# Patient Record
Sex: Female | Born: 1965 | Race: White | Hispanic: No | Smoking: Never smoker
Health system: Southern US, Community
[De-identification: ages and names within clinical notes are randomized; demographics above are authoritative.]

## PROBLEM LIST (undated history)

## (undated) DIAGNOSIS — H9190 Unspecified hearing loss, unspecified ear: Secondary | ICD-10-CM

## (undated) DIAGNOSIS — C50412 Malignant neoplasm of upper-outer quadrant of left female breast: Principal | ICD-10-CM

## (undated) DIAGNOSIS — C4490 Unspecified malignant neoplasm of skin, unspecified: Secondary | ICD-10-CM

## (undated) DIAGNOSIS — R232 Flushing: Secondary | ICD-10-CM

## (undated) DIAGNOSIS — M51369 Other intervertebral disc degeneration, lumbar region without mention of lumbar back pain or lower extremity pain: Secondary | ICD-10-CM

## (undated) DIAGNOSIS — C50919 Malignant neoplasm of unspecified site of unspecified female breast: Secondary | ICD-10-CM

## (undated) DIAGNOSIS — M5136 Other intervertebral disc degeneration, lumbar region: Secondary | ICD-10-CM

## (undated) DIAGNOSIS — C439 Malignant melanoma of skin, unspecified: Secondary | ICD-10-CM

## (undated) DIAGNOSIS — I10 Essential (primary) hypertension: Secondary | ICD-10-CM

## (undated) DIAGNOSIS — K219 Gastro-esophageal reflux disease without esophagitis: Secondary | ICD-10-CM

## (undated) DIAGNOSIS — C4491 Basal cell carcinoma of skin, unspecified: Secondary | ICD-10-CM

## (undated) DIAGNOSIS — F419 Anxiety disorder, unspecified: Secondary | ICD-10-CM

## (undated) HISTORY — DX: Anxiety disorder, unspecified: F41.9

## (undated) HISTORY — DX: Flushing: R23.2

## (undated) HISTORY — DX: Unspecified malignant neoplasm of skin, unspecified: C44.90

## (undated) HISTORY — DX: Malignant neoplasm of upper-outer quadrant of left female breast: C50.412

## (undated) HISTORY — DX: Malignant neoplasm of unspecified site of unspecified female breast: C50.919

## (undated) HISTORY — PX: DILATION AND CURETTAGE OF UTERUS: SHX78

## (undated) HISTORY — PX: OTHER SURGICAL HISTORY: SHX169

---

## 1988-12-25 HISTORY — PX: OTHER SURGICAL HISTORY: SHX169

## 2007-12-26 HISTORY — PX: BLADDER SUSPENSION: SHX72

## 2014-12-25 DIAGNOSIS — Z9221 Personal history of antineoplastic chemotherapy: Secondary | ICD-10-CM

## 2014-12-25 HISTORY — DX: Personal history of antineoplastic chemotherapy: Z92.21

## 2015-03-26 DIAGNOSIS — C50919 Malignant neoplasm of unspecified site of unspecified female breast: Secondary | ICD-10-CM

## 2015-03-26 HISTORY — DX: Malignant neoplasm of unspecified site of unspecified female breast: C50.919

## 2015-04-16 ENCOUNTER — Encounter: Payer: Self-pay | Admitting: *Deleted

## 2015-04-16 ENCOUNTER — Other Ambulatory Visit: Payer: Self-pay | Admitting: *Deleted

## 2015-04-16 DIAGNOSIS — C50412 Malignant neoplasm of upper-outer quadrant of left female breast: Secondary | ICD-10-CM

## 2015-04-16 DIAGNOSIS — Z170 Estrogen receptor positive status [ER+]: Secondary | ICD-10-CM | POA: Insufficient documentation

## 2015-04-16 HISTORY — DX: Malignant neoplasm of upper-outer quadrant of left female breast: C50.412

## 2015-04-16 NOTE — Progress Notes (Signed)
Confirmed [facility] for 04/21/15 at 1200 .  Instructions and contact information given. 

## 2015-04-21 ENCOUNTER — Other Ambulatory Visit: Payer: BLUE CROSS/BLUE SHIELD

## 2015-04-21 ENCOUNTER — Ambulatory Visit (HOSPITAL_BASED_OUTPATIENT_CLINIC_OR_DEPARTMENT_OTHER): Payer: BLUE CROSS/BLUE SHIELD | Admitting: Oncology

## 2015-04-21 ENCOUNTER — Ambulatory Visit: Payer: BLUE CROSS/BLUE SHIELD

## 2015-04-21 ENCOUNTER — Ambulatory Visit
Admission: RE | Admit: 2015-04-21 | Discharge: 2015-04-21 | Disposition: A | Discharge status: Home / Self Care | Payer: BLUE CROSS/BLUE SHIELD | Source: Ambulatory Visit | Attending: Radiation Oncology | Admitting: Radiation Oncology

## 2015-04-21 ENCOUNTER — Encounter (INDEPENDENT_AMBULATORY_CARE_PROVIDER_SITE_OTHER): Payer: Self-pay

## 2015-04-21 ENCOUNTER — Other Ambulatory Visit: Payer: Self-pay | Admitting: General Surgery

## 2015-04-21 ENCOUNTER — Encounter: Payer: Self-pay | Admitting: Oncology

## 2015-04-21 ENCOUNTER — Encounter: Payer: Self-pay | Admitting: Physical Therapy

## 2015-04-21 ENCOUNTER — Telehealth: Payer: Self-pay | Admitting: Oncology

## 2015-04-21 ENCOUNTER — Ambulatory Visit: Payer: BLUE CROSS/BLUE SHIELD | Attending: General Surgery | Admitting: Physical Therapy

## 2015-04-21 VITALS — BP 144/84 | HR 82 | Temp 98.2°F | Resp 18 | Ht 68.0 in | Wt 177.1 lb

## 2015-04-21 DIAGNOSIS — G43009 Migraine without aura, not intractable, without status migrainosus: Secondary | ICD-10-CM

## 2015-04-21 DIAGNOSIS — C50912 Malignant neoplasm of unspecified site of left female breast: Secondary | ICD-10-CM | POA: Insufficient documentation

## 2015-04-21 DIAGNOSIS — C50412 Malignant neoplasm of upper-outer quadrant of left female breast: Secondary | ICD-10-CM

## 2015-04-21 DIAGNOSIS — Z170 Estrogen receptor positive status [ER+]: Secondary | ICD-10-CM | POA: Diagnosis not present

## 2015-04-21 DIAGNOSIS — G43909 Migraine, unspecified, not intractable, without status migrainosus: Secondary | ICD-10-CM | POA: Insufficient documentation

## 2015-04-21 NOTE — Patient Instructions (Signed)

## 2015-04-21 NOTE — Telephone Encounter (Signed)
Gave patient avs report and appointments for May thru August. Echo to [facility] for pre-auth - sent for KJ. Appointments scheduled by KJ and Adjusted by MDale. Patient aware she will be contacted with echo.

## 2015-04-21 NOTE — Progress Notes (Signed)
Checked in new pt with no financial concerns prior to seeing the dr. Informed pt if chemo is part of her treatment we will contact her ins to see if Josem Kaufmann is req and will obtain it if it is as well as contact foundations that offer copay assistance for chemo if needed. She has my card for any billing questions or concerns.

## 2015-04-21 NOTE — Progress Notes (Signed)
  Radiation Oncology         [phone] ________________________________  Initial Outpatient Consultation - Date: 04/21/2015   Name: Cynthia [name] MRN: [id number]   DOB: 01/22/1966  REFERRING PHYSICIAN: Rolm Bookbinder, MD  DIAGNOSIS AND STAGE: Stage II Left Breast Cancer  HISTORY OF PRESENT ILLNESS::Cynthia [name] is a 49 y.o. female.  Presented with a palpable left breast mass. Mammogram showed a 2 cm mass with a 4 mm satellite nodule. 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 This was ER positive 90 % PR positive 90% and HER-2 positive. Ki-67 is 20%.  The patient had her menarche at age 89. She is still experiencing periods but with irregularity, her last period was on 03/19/15.  The patient is GXP3, with her first live birth being at age 89. She is not pregnant now and is not trying to get pregnant. She has used birth control pills and/or hormone shots for contraception for a period of 15 years.   She is accompanied by her mother.  She is feeling well after her biopsy.  She is interested in breast conservation.   PREVIOUS RADIATION THERAPY: No  Past medical, social and family history were reviewed in the electronic chart. Review of symptoms was reviewed in the electronic chart. Medications were reviewed in the electronic chart.   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   PLAN:We discussed the role of radiation and decreasing local failures in patients who undergo lumpectomy. We discussed the retrospective data showing an increase in failure rates in patients who even have a pathologic complete response to neoadjuvant chemotherapy and did not undergo radiation. For this reason I have recommended radiation to the  whole breast followed by boost to the tumor bed beginning 1 month after her surgery. We discussed the process of simulation the placement tattoos. We discussed possible side effects during treatment including but not limited to skin irritation darkness and fatigue. We discussed long-term effects of treatment which are extremely unlikely but possible including damage to the lungs and ribs. We discussed the low likelihood of secondary malignancies.  She will have port placement, MRI and neoadjuvant chemotherapy. She saw physical therapy, our survivorship navigator, our patient and family support team and breast cancer navigator.   I spent 40 minutes  face to face with the patient and more than 50% of that time was spent in counseling and/or coordination of care.   ------------------------------------------------  Thea Silversmith, MD  This document serves as a record of services personally performed by Lance Morin, MD. It was created on her behalf by Derek Mound, a trained medical scribe. The creation of this record is based on the scribe's personal observations and the provider's statements to them. This document has been checked and approved by the attending provider.

## 2015-04-21 NOTE — Therapy (Signed)
[place], [place], [address] Phone: [phone]   Fax:  [phone]  Physical Therapy Evaluation  Patient Details  Name: Cynthia [name] MRN: [id number] Date of Birth: 05/05/1966 Referring Provider:  Rolm Bookbinder, MD  Encounter Date: 04/21/2015      PT End of Session - 04/21/15 2111    Visit Number 1   Number of Visits 1   PT Start Time 3614   PT Stop Time 1605   PT Time Calculation (min) 30 min   Activity Tolerance Patient tolerated treatment well   Behavior During Therapy [facility] for tasks assessed/performed      Past Medical History  Diagnosis Date  . 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 Anxiety   . Hot flashes     Past Surgical History  Procedure Laterality Date  . Bladder suspension    . Dnc      There were no vitals filed for this visit.  Visit Diagnosis:  Invasive ductal carcinoma of left breast - Plan: PT plan of care cert/re-cert      Subjective Assessment - 04/21/15 2103    Subjective Patient was seen today for a baseline assessment of her newly diagnosed left breast cancer.   Patient is accompained by: Family member  Mom   Pertinent History Patient was diagnosed on 04/14/15 with left Triple positive breast cancer.  Her mass measures 2 cm in size and has a Ki67 of 20%.  It is invasive ductal carcinoma with DCIS.   Patient Stated Goals Reduce lymphedema risk and learn a post op ROM HEP for her left shoulder.   Currently in Pain? No/denies            [facility] PT Assessment - 04/21/15 0001    Assessment   Medical Diagnosis Left breast cancer   Onset Date 04/14/15   Precautions   Precautions Other (comment)  Active left breast cancer   Restrictions   Weight Bearing Restrictions No   Balance Screen   Has the patient fallen in the past 6 months No   Has the patient had a decrease in activity level because of a fear of falling?  No    Is the patient reluctant to leave their home because of a fear of falling?  No   Home Environment   Living Enviornment Private residence   Living Arrangements Children;Parent  Dad and 74 y.o. daughter   Available Help at Discharge Family   Prior Function   Level of Independence Independent with basic ADLs   Vocation Full time employment   Industrial/product designer; is in Press photographer   Leisure She runs 30 minutes and lifts weights 4-5x/wk   Cognition   Overall Cognitive Status Within Functional Limits for tasks assessed   Posture/Postural Control   Posture/Postural Control No significant limitations   ROM / Strength   AROM / PROM / Strength AROM;Strength   AROM   AROM Assessment Site Shoulder   Right/Left Shoulder Right;Left   Right Shoulder Extension 50 Degrees   Right Shoulder Flexion 160 Degrees   Right Shoulder ABduction 167 Degrees   Right Shoulder Internal Rotation 58 Degrees   Right Shoulder External Rotation 80 Degrees   Left Shoulder Extension 55 Degrees   Left Shoulder Flexion 151 Degrees   Left Shoulder ABduction 165 Degrees   Left Shoulder Internal Rotation 77 Degrees   Left Shoulder External Rotation 84 Degrees   Strength   Overall Strength Within  functional limits for tasks performed           LYMPHEDEMA/ONCOLOGY QUESTIONNAIRE - 04/21/15 2109    Type   Cancer Type Left breast   Lymphedema Assessments   Lymphedema Assessments Upper extremities   Right Upper Extremity Lymphedema   10 cm Proximal to Olecranon Process 28.2 cm   Olecranon Process 25.3 cm   10 cm Proximal to Ulnar Styloid Process 21.8 cm   Just Proximal to Ulnar Styloid Process 15.5 cm   Across Hand at [employer] 18.4 cm   At [place] of 2nd Digit 6.2 cm   Left Upper Extremity Lymphedema   10 cm Proximal to Olecranon Process 28.7 cm   Olecranon Process 25.7 cm   10 cm Proximal to Ulnar Styloid Process 22.4 cm   Just Proximal to Ulnar Styloid Process 15.2 cm   Across Hand at [employer] 18.2 cm   At [place] of 2nd Digit 6.2 cm       Patient was instructed today in a home exercise program today for post op shoulder range of motion.  These included active assist shoulder flexion in sitting, scapular retraction, wall walking with shoulder abduction,  and hands behind head external rotation.  She was encouraged to do these twice a day, holding 3 seconds and  repeating 5 times when permitted by her physician.         PT Education - 04/21/15 2110    Education provided Yes   Education Details Post op shoulder ROM HEP and lymphedema risk reduction   Person(s) Educated Patient;Parent(s)   Methods Demonstration;Explanation;Handout   Comprehension Verbalized understanding;Returned demonstration              Breast Clinic Goals - 04/21/15 2119    Patient will be able to verbalize understanding of pertinent lymphedema risk reduction practices relevant to her diagnosis specifically related to skin care.   Time 1   Period Days   Status Achieved   Patient will be able to return demonstrate and/or verbalize understanding of the post-op home exercise program related to regaining shoulder range of motion.   Time 1   Period Days   Status Achieved   Patient will be able to verbalize understanding of the importance of attending the postoperative After Breast Cancer Class for further lymphedema risk reduction education and therapeutic exercise.   Time 1   Period Days   Status Achieved              Plan - 04/21/15 2115    Clinical Impression Statement Patient was diagnosed on 04/14/15 with left Triple positive breast cancer.  Her mass measures 2 cm in size and has a Ki67 of 20%.  It is invasive ductal carcinoma with DCIS.  She is planning to have a breast MRI and genetic testing followed by neoadjuvant chemotherapy and likely a right lumpectomy with a sentinel node biopsy.  She will likely undergo radiation treatment and anti-estrogen therapy.  She will benefit from  post op physical therapy to regain shoulder ROM and strength and reduce her risk for lymphedema.   Pt will benefit from skilled therapeutic intervention in order to improve on the following deficits Decreased range of motion;Increased edema;Impaired UE functional use;Pain;Decreased strength;Decreased knowledge of precautions   Rehab Potential Excellent   Clinical Impairments Affecting Rehab Potential none   PT Frequency One time visit   PT Treatment/Interventions Patient/family education;Therapeutic exercise   Consulted and Agree with Plan of Care Patient;Family member/caregiver   Family Member  Consulted Mom     Patient will follow up at outpatient cancer rehab if needed following surgery.  If the patient requires physical therapy  at that time, a specific plan will be dictated and sent to the referring physician for approval. The patient was educated today on appropriate basic range of motion exercises to begin post operatively and the  importance of attending the After Breast Cancer class following surgery.  Patient was educated today on lymphedema  risk reduction practices as it pertains to recommendations that will benefit the patient immediately following surgery.   She verbalized good understanding.  No additional physical therapy is indicated at this time.       Problem List Patient Active Problem List   Diagnosis Date Noted  . Migraines 04/21/2015  . Breast cancer of upper-outer quadrant of left female breast 04/16/2015    Annia Friendly, PT 04/21/2015, 9:22 PM  [place], [place], [address] Phone: [phone]   Fax:  [phone]

## 2015-04-21 NOTE — Progress Notes (Signed)
Cynthia [name]  Telephone:([phone]) [phone] Fax:(336) [phone]     ID: Cynthia [name] DOB: 1966/03/18  MR#: [id number]  ZMO#:[id number]  Patient Care Team: Cynthia Fallen, MD as PCP - General (Obstetrics and Gynecology) Cynthia Bookbinder, MD as Consulting Physician (General Surgery) Cynthia Cruel, MD as Consulting Physician (Oncology) Cynthia Rudd, MD as Consulting Physician (Radiation Oncology) Cynthia Kaufmann, RN as Registered Nurse Cynthia Germany, RN as Registered Nurse PCP: Cynthia Royals, MD OTHER MD: Cynthia [name] M.D.  CHIEF COMPLAINT: Triple positive breast cancer  CURRENT TREATMENT: Neoadjuvant chemotherapy   BREAST CANCER HISTORY: Cynthia [name] had noted a change in her left breast on self-exam the last week in March. She brought it to Dr. Trellis Paganini attention and on 04/13/2015 underwent diagnostic bilateral mammography and left ultrasonography at [facility]. This found the breast composition to be category D. An area of architectural distortion was noted in the left breast upper outer quadrant correlating to the palpable mass. By ultrasound there was a 2 cm lobulated mass in the left breast upper outer quadrant which was hypoechoic. There was a 4 mm nodule immediately adjacent to this felt to be a satellite lesion.  Biopsy of the left breast mass in question 04/14/2015 showed ([id number]) invasive ductal carcinoma, grade 2, estrogen receptor 90% positive, progesterone receptor 90% positive, with an MIB-1 of 20%, and HER-2 amplified, the signals ratio being 5.36.  The patient's subsequent history is as detailed below  INTERVAL HISTORY: The cell was evaluated in the multidisciplinary breast cancer clinic 05/21/2015 accompanied by her mother. Her case was also discussed at the multidisciplinary breast cancer conference that same morning. At that time genetics referral was suggested and an MRI was requested. It was felt the patient would do well with neoadjuvant treatment. She was not  felt to be a trial candidate however.  REVIEW OF SYSTEMS: Aside from the palpable mass itself, there were no specific symptoms leading to the original mammogram, which was routinely scheduled. The patient has a long history of headaches , possibly migraines, but denies visual changes, nausea, vomiting, stiff neck, dizziness, or gait imbalance. There has been no cough, phlegm production, or pleurisy, no chest pain or pressure, and no change in bowel or bladder habits. The patient denies fever, rash, bleeding, unexplained fatigue or unexplained weight loss. She does admit to some insomnia, seasonal allergy symptoms, mild heartburn, stress urinary incontinence, hot flashes, and anxiety. A detailed review of systems was otherwise entirely negative.     [name]: Past Medical History  Diagnosis Date  . 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 Anxiety   . Hot flashes    melanoma, removed in [place]   PAST SURGICAL HISTORY: Past Surgical History  Procedure Laterality Date  . Bladder suspension    . Dnc      FAMILY HISTORY Family History  Problem Relation Age of Onset  . Breast cancer Maternal Aunt   . Lung cancer Maternal Grandmother   . Lung cancer Paternal Grandfather    the patient's parents are still living as of April 2016. They are divorced. The patient is an only child. The patient's paternal grandfather and maternal grandmother had a history of lung cancer. On the mother's side there is a history of melanoma. The patient's mother's only sister was diagnosed with breast cancer in her 59s. There is no history of ovarian cancer in the family.   GYNECOLOGIC HISTORY:  No LMP recorded.  menarche age 62, first  live birth age 24, the patient is GX P3. Her periods are ongoing but irregular. She has a Mirena in place for contraception. She took oral contraceptives for approximately 15 years in the past with no complications.   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 She is single, and lives at home with her father, who is disabled and has a history of depression. At home also is [name] "[name], 50 years old as of April 2016. The patient's 2 other daughters are [name], lives in [place] and is a physical therapy technician, and [name], lives in [facility] and works in Press photographer.     ADVANCED DIRECTIVES:  not in place.    HEALTH MAINTENANCE: History  Substance Use Topics  . Smoking status: Light Tobacco Smoker    Types: Cigarettes  . Smokeless tobacco: Not on file  . Alcohol Use: Yes     Comment: occasion     Colonoscopy: never   PAP:  Bone density: never   Lipid panel:  Not on File  Current Outpatient Prescriptions  Medication Sig Dispense Refill  . Biotin 5000 MCG CAPS Take by mouth.    . Ginkgo Biloba 120 MG CAPS Take by mouth.    . Ginseng 100 MG CAPS Take by mouth.    . Lysine 1000 MG TABS Take by mouth.    . Multiple Vitamins-Minerals (CENTRUM SILVER PO) Take by mouth.    . Omega-3 Fatty Acids (OMEGA-3 FISH OIL PO) Take by mouth.    Francella Solian Johns Wort 300 MG CAPS Take by mouth.    Marland Kitchen UNABLE TO FIND 200 mg. Gaba plus    . ALPRAZolam (XANAX) 1 MG tablet      No current facility-administered medications for this visit.    OBJECTIVE: middle-aged white woman appears stated age  49 Vitals:   04/21/15 1255  BP: 144/84  Pulse: 82  Temp: 98.2 F (36.8 C)  Resp: 18     Body mass index is 26.93 kg/(m^2).    ECOG FS:0 - Asymptomatic  Ocular: Sclerae unicteric, pupils equal, round and reactive to light Ear-nose-throat: Oropharynx clear, dentition fair Lymphatic: No cervical or supraclavicular adenopathy Lungs no rales or rhonchi, good excursion bilaterally Heart regular rate and rhythm, no murmur appreciated Abd soft, nontender, positive bowel sounds MSK no focal spinal tenderness, no joint edema Neuro: non-focal, well-oriented, appropriate  affect Breasts:  the right breast is unremarkable. The left breast is status post recent biopsy. There is a mild ecchymosis. The masses palpable in the upper outer quadrant laterally at approximately 2:00. It measures approximately 1 cm by palpation. There is no evidence of skin or nipple involvement. The left axilla is benign.    LAB RESULTS:  CMP  No results found for: NA, K, CL, CO2, GLUCOSE, BUN, CREATININE, CALCIUM, PROT, ALBUMIN, AST, ALT, ALKPHOS, BILITOT, GFRNONAA, GFRAA  INo results found for: SPEP, UPEP  No results found for: WBC, NEUTROABS, HGB, HCT, MCV, PLT    Chemistry   No results found for: NA, K, CL, CO2, BUN, CREATININE, GLU No results found for: CALCIUM, ALKPHOS, AST, ALT, BILITOT     No results found for: LABCA2  No components found for: [id number]  No results for input(s): INR in the last 168 hours.  Urinalysis No results found for: COLORURINE, APPEARANCEUR, LABSPEC, PHURINE, GLUCOSEU, HGBUR, BILIRUBINUR, KETONESUR, PROTEINUR, UROBILINOGEN, NITRITE, LEUKOCYTESUR  STUDIES: No results found.  ASSESSMENT: 49 y.o. [place] woman status post  left breast biopsy 04/14/2015 for a clinical T2 N0, stage IIA invasive ductal carcinoma, grade 2, estrogen and progesterone receptor positive, with HER-2 amplified, and an MIB-1 of 20%  (1). Neoadjuvant chemotherapy will consist of carboplatin, docetaxel, trastuzumab and pertuzumab every 21 days 6, with onpro support  (2) trastuzumab to be continued to total one year  (3) surgery to follow chemotherapy  (4) radiation to follow surgery  (5) anti-estrogens to follow radiation  (6) genetics testing pending  PLAN: We spent the better part of today's hour-long appointment discussing the biology of breast cancer in general, and the specifics of the patient's tumor in particular. The cell understands that there is no survival advantage to starting with surgery in proceeding to chemotherapy, versus the opposite sequence. In  her case we are considering neoadjuvant chemotherapy to avoid delays in definitive surgery: The patient will need genetics results prior to making a definitive surgical decision and this can take several weeks.  Accordingly we discussed carboplatin, docetaxel, trastuzumab and pertuzumab. Jovee has a good understanding of the possible toxicities, side effects and complications of these agents. She will have an echocardiogram, undergo port placement come to chemotherapy school, and she will meet with our nurse practitioner to discuss supportive medications as well as to receive a "roadmap" on how to take those medications.  Target start date is May 12.  I have suggested she remove her Mirena and consider a copper IUD or barrier method of contraception. If she chooses tamoxifen, as seems likely to start with, once she completes the other treatments, she can have the Mirena replaced if she wishes.  Also, Aubriana qualifies for genetics testing and this is being operationalized. She has a good understanding of the overall plan. She agrees with it. She knows the goal of treatment in her case is cure. She will call with any problems that may develop before her next visit here.  Cynthia Cruel, MD   04/21/2015 6:02 PM Medical Oncology and Hematology [facility] [address] [place], [place] [address] Tel. [phone]    Fax. [phone]

## 2015-04-22 ENCOUNTER — Telehealth: Payer: Self-pay | Admitting: *Deleted

## 2015-04-22 NOTE — Telephone Encounter (Signed)
Spoke with patient to move her chemo education class to 5/5 due to per [facility] they need to put her port in on 5/3.  Patient aware of change in appointment.

## 2015-04-23 ENCOUNTER — Encounter (HOSPITAL_BASED_OUTPATIENT_CLINIC_OR_DEPARTMENT_OTHER): Payer: Self-pay | Admitting: *Deleted

## 2015-04-23 ENCOUNTER — Encounter: Payer: Self-pay | Admitting: General Practice

## 2015-04-23 NOTE — Progress Notes (Signed)
Had labs at gyn-called to see if they had labs needed

## 2015-04-23 NOTE — Progress Notes (Signed)
[place] Psychosocial Distress Screening Spiritual Care  Spiritual Care was referred by distress screening protocol.  The patient scored a 6 on the Psychosocial Distress Thermometer which indicates moderate distress. Followed up by phone to assess for distress and other psychosocial needs.   ONCBCN DISTRESS SCREENING 04/23/2015  Screening Type Initial Screening  Distress experienced in past week (1-10) 6  Emotional problem type Nervousness/Anxiety  Information Concerns Type Lack of info about treatment  Physical Problem type Sleep/insomnia;Constipation/diarrhea  Referral to support programs Yes   Spoke at length with Cynthia [name], who shared frankly about her support and processing.  Per pt, her job is flexible, and work group very supportive.  Per pt, she moved back to this area from [place] in 2013, staying with her dad to help him (hx major foot/ankle injury).  She has three daughters, almost 69, 19, and 32; she has spoken with youngest child's counselor to plan how to talk about her cancer (per pt, she plans to share news with her this weekend).  Encouraged pt to reach out anytime for support, encouragement, programming.  She has full packet of [place].  Cynthia [name] indicated interest as she becomes more ready emotionally and was pleased to meet Cynthia [name] from [place] (and to be matched with a guide).  Follow up needed: No.  Please page as needs arise.  [place], [place]

## 2015-04-26 ENCOUNTER — Other Ambulatory Visit: Payer: Self-pay | Admitting: Nurse Practitioner

## 2015-04-26 ENCOUNTER — Ambulatory Visit
Admission: RE | Admit: 2015-04-26 | Discharge: 2015-04-26 | Disposition: A | Discharge status: Home / Self Care | Payer: BLUE CROSS/BLUE SHIELD | Source: Ambulatory Visit | Attending: General Surgery | Admitting: General Surgery

## 2015-04-26 DIAGNOSIS — C50412 Malignant neoplasm of upper-outer quadrant of left female breast: Secondary | ICD-10-CM

## 2015-04-26 MED ORDER — GADOBENATE DIMEGLUMINE 529 MG/ML IV SOLN
16.0000 mL | Freq: Once | INTRAVENOUS | Status: AC | PRN
  Administered 2015-04-26: 16 mL via INTRAVENOUS

## 2015-04-26 NOTE — Telephone Encounter (Signed)
S/w pt confirming Echo schedule same day as port added, also advised pt I would put a copy of schedule in envelope to p/u at next visit, pt request extra copy for her mom.... KJ

## 2015-04-27 ENCOUNTER — Telehealth: Payer: Self-pay | Admitting: Oncology

## 2015-04-27 ENCOUNTER — Encounter (HOSPITAL_BASED_OUTPATIENT_CLINIC_OR_DEPARTMENT_OTHER): Payer: Self-pay

## 2015-04-27 ENCOUNTER — Ambulatory Visit (HOSPITAL_COMMUNITY): Payer: BLUE CROSS/BLUE SHIELD

## 2015-04-27 ENCOUNTER — Ambulatory Visit (HOSPITAL_BASED_OUTPATIENT_CLINIC_OR_DEPARTMENT_OTHER)
Admission: RE | Admit: 2015-04-27 | Discharge: 2015-04-27 | Disposition: A | Discharge status: Home / Self Care | Payer: BLUE CROSS/BLUE SHIELD | Source: Ambulatory Visit | Attending: General Surgery | Admitting: General Surgery

## 2015-04-27 ENCOUNTER — Ambulatory Visit (HOSPITAL_BASED_OUTPATIENT_CLINIC_OR_DEPARTMENT_OTHER): Payer: BLUE CROSS/BLUE SHIELD | Admitting: Anesthesiology

## 2015-04-27 ENCOUNTER — Encounter (HOSPITAL_BASED_OUTPATIENT_CLINIC_OR_DEPARTMENT_OTHER)
Admission: RE | Disposition: A | Discharge status: Home / Self Care | Payer: Self-pay | Source: Ambulatory Visit | Attending: General Surgery

## 2015-04-27 ENCOUNTER — Other Ambulatory Visit: Payer: BLUE CROSS/BLUE SHIELD

## 2015-04-27 ENCOUNTER — Other Ambulatory Visit (HOSPITAL_COMMUNITY): Payer: BLUE CROSS/BLUE SHIELD

## 2015-04-27 DIAGNOSIS — C50411 Malignant neoplasm of upper-outer quadrant of right female breast: Secondary | ICD-10-CM | POA: Diagnosis not present

## 2015-04-27 DIAGNOSIS — Z85820 Personal history of malignant melanoma of skin: Secondary | ICD-10-CM | POA: Insufficient documentation

## 2015-04-27 DIAGNOSIS — G43909 Migraine, unspecified, not intractable, without status migrainosus: Secondary | ICD-10-CM | POA: Insufficient documentation

## 2015-04-27 DIAGNOSIS — Z170 Estrogen receptor positive status [ER+]: Secondary | ICD-10-CM | POA: Insufficient documentation

## 2015-04-27 DIAGNOSIS — Z95828 Presence of other vascular implants and grafts: Secondary | ICD-10-CM

## 2015-04-27 DIAGNOSIS — Z87891 Personal history of nicotine dependence: Secondary | ICD-10-CM | POA: Insufficient documentation

## 2015-04-27 DIAGNOSIS — C50911 Malignant neoplasm of unspecified site of right female breast: Secondary | ICD-10-CM | POA: Diagnosis present

## 2015-04-27 HISTORY — PX: PORTACATH PLACEMENT: SHX2246

## 2015-04-27 SURGERY — INSERTION, TUNNELED CENTRAL VENOUS DEVICE, WITH PORT
Anesthesia: General | Site: Chest | Laterality: Right

## 2015-04-27 MED ORDER — CEFAZOLIN SODIUM-DEXTROSE 2-3 GM-% IV SOLR
INTRAVENOUS | Status: DC | PRN
  Administered 2015-04-27: 2 g via INTRAVENOUS

## 2015-04-27 MED ORDER — HYDROCODONE-ACETAMINOPHEN 5-325 MG PO TABS: 1.0000 | ORAL_TABLET | Freq: Four times a day (QID) | ORAL | Status: DC | PRN

## 2015-04-27 MED ORDER — DEXAMETHASONE SODIUM PHOSPHATE 4 MG/ML IJ SOLN
INTRAMUSCULAR | Status: DC | PRN
  Administered 2015-04-27: 10 mg via INTRAVENOUS

## 2015-04-27 MED ORDER — CEFAZOLIN SODIUM-DEXTROSE 2-3 GM-% IV SOLR
INTRAVENOUS | Status: AC
  Filled 2015-04-27: qty 50

## 2015-04-27 MED ORDER — FENTANYL CITRATE (PF) 100 MCG/2ML IJ SOLN
INTRAMUSCULAR | Status: DC | PRN
  Administered 2015-04-27: 100 ug via INTRAVENOUS

## 2015-04-27 MED ORDER — PROPOFOL 10 MG/ML IV BOLUS
INTRAVENOUS | Status: AC
  Filled 2015-04-27: qty 20

## 2015-04-27 MED ORDER — HEPARIN (PORCINE) IN NACL 2-0.9 UNIT/ML-% IJ SOLN
INTRAMUSCULAR | Status: DC | PRN
  Administered 2015-04-27: 500 mL via INTRAVENOUS

## 2015-04-27 MED ORDER — HEPARIN SOD (PORK) LOCK FLUSH 100 UNIT/ML IV SOLN
INTRAVENOUS | Status: AC
Start: 2015-04-27 — End: 2015-04-27
  Filled 2015-04-27: qty 5

## 2015-04-27 MED ORDER — MIDAZOLAM HCL 2 MG/2ML IJ SOLN
INTRAMUSCULAR | Status: AC
  Filled 2015-04-27: qty 2

## 2015-04-27 MED ORDER — HEPARIN (PORCINE) IN NACL 2-0.9 UNIT/ML-% IJ SOLN
INTRAMUSCULAR | Status: AC
  Filled 2015-04-27: qty 500

## 2015-04-27 MED ORDER — ONDANSETRON HCL 4 MG/2ML IJ SOLN
INTRAMUSCULAR | Status: DC | PRN
  Administered 2015-04-27: 4 mg via INTRAVENOUS

## 2015-04-27 MED ORDER — LIDOCAINE HCL (CARDIAC) 20 MG/ML IV SOLN
INTRAVENOUS | Status: DC | PRN
  Administered 2015-04-27: 200 mg via INTRAVENOUS

## 2015-04-27 MED ORDER — CEFAZOLIN SODIUM-DEXTROSE 2-3 GM-% IV SOLR
2.0000 g | INTRAVENOUS | Status: AC
  Administered 2015-04-27: 2 g via INTRAVENOUS

## 2015-04-27 MED ORDER — FENTANYL CITRATE (PF) 100 MCG/2ML IJ SOLN
INTRAMUSCULAR | Status: AC
  Filled 2015-04-27: qty 4

## 2015-04-27 MED ORDER — MIDAZOLAM HCL 5 MG/5ML IJ SOLN
INTRAMUSCULAR | Status: DC | PRN
  Administered 2015-04-27: 2 mg via INTRAVENOUS

## 2015-04-27 MED ORDER — LACTATED RINGERS IV SOLN
INTRAVENOUS | Status: DC
  Administered 2015-04-27 (×2): via INTRAVENOUS

## 2015-04-27 MED ORDER — BUPIVACAINE HCL (PF) 0.25 % IJ SOLN
INTRAMUSCULAR | Status: AC
  Filled 2015-04-27: qty 30

## 2015-04-27 MED ORDER — BUPIVACAINE HCL (PF) 0.25 % IJ SOLN
INTRAMUSCULAR | Status: DC | PRN
  Administered 2015-04-27: 10 mL

## 2015-04-27 MED ORDER — HEPARIN SOD (PORK) LOCK FLUSH 100 UNIT/ML IV SOLN
INTRAVENOUS | Status: DC | PRN
  Administered 2015-04-27: 500 [IU] via INTRAVENOUS

## 2015-04-27 SURGICAL SUPPLY — 53 items
BAG DECANTER FOR FLEXI CONT (MISCELLANEOUS) ×2 IMPLANT
BENZOIN TINCTURE PRP APPL 2/3 (GAUZE/BANDAGES/DRESSINGS) IMPLANT
BLADE SURG 11 STRL SS (BLADE) ×2 IMPLANT
BLADE SURG 15 STRL LF DISP TIS (BLADE) ×1 IMPLANT
BLADE SURG 15 STRL SS (BLADE) ×1
CANISTER SUCT 1200ML W/VALVE (MISCELLANEOUS) IMPLANT
CHLORAPREP W/TINT 26ML (MISCELLANEOUS) ×2 IMPLANT
COVER BACK TABLE 60X90IN (DRAPES) ×2 IMPLANT
COVER MAYO STAND STRL (DRAPES) ×2 IMPLANT
COVER PROBE 5X48 (MISCELLANEOUS) ×1
DECANTER SPIKE VIAL GLASS SM (MISCELLANEOUS) IMPLANT
DRAPE C-ARM 42X72 X-RAY (DRAPES) ×2 IMPLANT
DRAPE LAPAROSCOPIC ABDOMINAL (DRAPES) ×2 IMPLANT
DRAPE UTILITY XL STRL (DRAPES) IMPLANT
DRSG TEGADERM 4X4.75 (GAUZE/BANDAGES/DRESSINGS) IMPLANT
ELECT COATED BLADE 2.86 ST (ELECTRODE) ×2 IMPLANT
ELECT REM PT RETURN 9FT ADLT (ELECTROSURGICAL) ×2
ELECTRODE REM PT RTRN 9FT ADLT (ELECTROSURGICAL) ×1 IMPLANT
GLOVE BIO SURGEON STRL SZ 6.5 (GLOVE) ×2 IMPLANT
GLOVE BIO SURGEON STRL SZ7 (GLOVE) ×4 IMPLANT
GLOVE BIOGEL PI IND STRL 7.0 (GLOVE) ×1 IMPLANT
GLOVE BIOGEL PI IND STRL 7.5 (GLOVE) ×2 IMPLANT
GLOVE BIOGEL PI INDICATOR 7.0 (GLOVE) ×1
GLOVE BIOGEL PI INDICATOR 7.5 (GLOVE) ×2
GLOVE EXAM NITRILE EXT CUFF MD (GLOVE) ×2 IMPLANT
GOWN STRL REUS W/ TWL LRG LVL3 (GOWN DISPOSABLE) ×3 IMPLANT
GOWN STRL REUS W/TWL LRG LVL3 (GOWN DISPOSABLE) ×3
IV KIT MINILOC 20X1 SAFETY (NEEDLE) IMPLANT
KIT CVR 48X5XPRB PLUP LF (MISCELLANEOUS) ×1 IMPLANT
KIT PORT POWER 8FR ISP CVUE (Catheter) ×2 IMPLANT
LIQUID BAND (GAUZE/BANDAGES/DRESSINGS) ×2 IMPLANT
MARKER SKIN DUAL TIP RULER LAB (MISCELLANEOUS) ×2 IMPLANT
NDL SAFETY ECLIPSE 18X1.5 (NEEDLE) IMPLANT
NEEDLE HYPO 18GX1.5 SHARP (NEEDLE)
NEEDLE HYPO 25X1 1.5 SAFETY (NEEDLE) ×2 IMPLANT
PACK BASIN DAY SURGERY FS (CUSTOM PROCEDURE TRAY) ×2 IMPLANT
PENCIL BUTTON HOLSTER BLD 10FT (ELECTRODE) ×2 IMPLANT
SLEEVE SCD COMPRESS KNEE MED (MISCELLANEOUS) ×2 IMPLANT
SPONGE GAUZE 4X4 12PLY STER LF (GAUZE/BANDAGES/DRESSINGS) IMPLANT
STAPLER VISISTAT 35W (STAPLE) ×2 IMPLANT
STRIP CLOSURE SKIN 1/2X4 (GAUZE/BANDAGES/DRESSINGS) IMPLANT
SUT MON AB 4-0 PC3 18 (SUTURE) ×2 IMPLANT
SUT PROLENE 2 0 SH DA (SUTURE) ×2 IMPLANT
SUT SILK 2 0 TIES 17X18 (SUTURE)
SUT SILK 2-0 18XBRD TIE BLK (SUTURE) IMPLANT
SUT VIC AB 3-0 SH 27 (SUTURE) ×1
SUT VIC AB 3-0 SH 27X BRD (SUTURE) ×1 IMPLANT
SYR 5ML LUER SLIP (SYRINGE) ×2 IMPLANT
SYR CONTROL 10ML LL (SYRINGE) ×2 IMPLANT
TOWEL OR 17X24 6PK STRL BLUE (TOWEL DISPOSABLE) ×4 IMPLANT
TOWEL OR NON WOVEN STRL DISP B (DISPOSABLE) IMPLANT
TUBE CONNECTING 20X1/4 (TUBING) IMPLANT
YANKAUER SUCT BULB TIP NO VENT (SUCTIONS) IMPLANT

## 2015-04-27 NOTE — Transfer of Care (Signed)
Immediate Anesthesia Transfer of Care Note  Patient: Cynthia [name]  Procedure(s) Performed: Procedure(s): INSERTION PORT-A-CATH (Right)  Patient Location: PACU  Anesthesia Type:General  Level of Consciousness: sedated  Airway & Oxygen Therapy: Patient Spontanous Breathing and Patient connected to face mask oxygen  Post-op Assessment: Report given to RN and Post -op Vital signs reviewed and stable  Post vital signs: Reviewed and stable  Last Vitals:  Filed Vitals:   04/27/15 1111  BP:   Pulse: 67  Temp:   Resp: 14    Complications: No apparent anesthesia complications

## 2015-04-27 NOTE — H&P (Signed)
49 yof who presents with palpable right breast mass noted in april. No other symptoms. Not sore. no nipple discharge. No prior breast history. She underwent mm and [place] that shows a 2 cm mass and a 4 mm nodule, [place] of axilla is negative. She has not had mri. 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 She also has history of melanoma on cheek that underwent excision   Other Problems Anderson Malta [place], RMA; 04/21/2015 7:52 AM) Back Pain Bladder Problems Breast Cancer Lump In Breast Melanoma Migraine Headache  Past Surgical History Anderson Malta [place], RMA; 04/21/2015 7:52 AM) Breast Biopsy Left. Oral Surgery  Diagnostic Studies History Anderson Malta [place], [place]; 04/21/2015 7:52 AM) Colonoscopy never Mammogram within last year Pap Smear 1-5 years ago  Social History Anderson Malta [place], RMA; 04/21/2015 7:52 AM) Alcohol use Occasional alcohol use. Caffeine use Tea. No drug use Tobacco use Former smoker.  Family History Anderson Malta [place], [place]; 04/21/2015 7:52 AM) Alcohol Abuse Father. Arthritis Father, Mother. Depression Father. Respiratory Condition Family Members In General.  Pregnancy / Birth History Jeanann [place], [place]; 04/21/2015 7:52 AM) Age at menarche 49 years. Contraceptive History Contraceptive implant, Intrauterine device, Oral contraceptives. Gravida 5 Irregular periods Maternal age 50-25 Para 3  Review of Systems Anderson Malta [place] RMA; 04/21/2015 7:52 AM) General Present- Fatigue. Not Present- Appetite Loss, Chills, Fever, Night Sweats, Weight Gain and Weight Loss. Skin Present- Rash. Not Present- Change in Wart/Mole, Dryness, Hives, Jaundice, New Lesions, Non-Healing Wounds and Ulcer. HEENT Present- Ringing in the Ears and Wears glasses/contact lenses. Not Present- Earache, Hearing Loss, Hoarseness, Nose Bleed, Oral Ulcers, Seasonal Allergies, Sinus Pain, Sore Throat, Visual Disturbances and  Yellow Eyes. Respiratory Present- Snoring. Not Present- Bloody sputum, Chronic Cough, Difficulty Breathing and Wheezing. Breast Present- Breast Mass. Not Present- Breast Pain, Nipple Discharge and Skin Changes. Cardiovascular Not Present- Chest Pain, Difficulty Breathing Lying Down, Leg Cramps, Palpitations, Rapid Heart Rate, Shortness of Breath and Swelling of Extremities. Gastrointestinal Present- Change in Bowel Habits. Not Present- Abdominal Pain, Bloating, Bloody Stool, Chronic diarrhea, Constipation, Difficulty Swallowing, Excessive gas, Gets full quickly at meals, Hemorrhoids, Indigestion, Nausea, Rectal Pain and Vomiting. Female Genitourinary Present- Frequency and Urgency. Not Present- Nocturia, Painful Urination and Pelvic Pain. Musculoskeletal Present- Back Pain. Not Present- Joint Pain, Joint Stiffness, Muscle Pain, Muscle Weakness and Swelling of Extremities. Neurological Present- Headaches and Numbness. Not Present- Decreased Memory, Fainting, Seizures, Tingling, Tremor, Trouble walking and Weakness. Psychiatric Present- Anxiety. Not Present- Bipolar, Change in Sleep Pattern, Depression, Fearful and Frequent crying. Endocrine Present- Hot flashes. Not Present- Cold Intolerance, Excessive Hunger, Hair Changes, Heat Intolerance and New Diabetes. Hematology Not Present- Easy Bruising, Excessive bleeding, Gland problems, HIV and Persistent Infections.   Physical Exam Rolm Bookbinder MD; 04/21/2015 10:45 PM) General Mental Status-Alert. Orientation-Oriented X3.  Chest and Lung Exam Chest and lung exam reveals -on auscultation, normal breath sounds, no adventitious sounds and normal vocal resonance.  Breast Nipples-No Discharge. Note: left uoq with hematoma and appearance of a 2 cm mass mobile   Cardiovascular Cardiovascular examination reveals -normal heart sounds, regular rate and rhythm with no murmurs.  Lymphatic Head & Neck  General Head & Neck Lymphatics:  Bilateral - Description - Normal. Axillary  General Axillary Region: Bilateral - Description - Normal. Note: no Ivy adenopathy     Assessment & Plan Rolm Bookbinder MD; 04/21/2015 10:48 PM) CARCINOMA OF BREAST UPPER OUTER QUADRANT, RIGHT (174.4  C50.411) Story: Port placement We discussed the staging  and pathophysiology of breast cancer. We discussed all of the different options for treatment for breast cancer including surgery, chemotherapy, radiation therapy, Herceptin, and antiestrogen therapy. I think with her2 positive disease it would benefit to do primary chemo with dual antiher2 therapy. will plan on port placement. will get mri to follow on primary systemic therapy. will also send for genetics We discussed a sentinel lymph node biopsy at time of surgery as she does not appear to having lymph node involvement right now. We discussed the performance of that with injection of radioactive tracer and blue dye. We discussed the options for treatment of the breast cancer which included lumpectomy versus a mastectomy. We discussed the performance of the lumpectomy with seed placement. We discussed a chance of a positive margin requiring reexcision in the operating room. We discussed the mastectomy and the postoperative care for that as well. We discussed that there is no difference in her survival whether she undergoes lumpectomy with radiation therapy or antiestrogen therapy versus a mastectomy.

## 2015-04-27 NOTE — Anesthesia Postprocedure Evaluation (Signed)
Anesthesia Post Note  Patient: Cynthia [name]  Procedure(s) Performed: Procedure(s) (LRB): INSERTION PORT-A-CATH (Right)  Anesthesia type: general  Patient location: PACU  Post pain: Pain level controlled  Post assessment: Patient's Cardiovascular Status Stable  Last Vitals:  Filed Vitals:   04/27/15 1115  BP: 92/57  Pulse: 65  Temp:   Resp: 13    Post vital signs: Reviewed and stable  Level of consciousness: sedated  Complications: No apparent anesthesia complications

## 2015-04-27 NOTE — Telephone Encounter (Signed)
Left message to confirm appointment for ECHO. °

## 2015-04-27 NOTE — Discharge Instructions (Signed)

## 2015-04-27 NOTE — Anesthesia Preprocedure Evaluation (Signed)
Anesthesia Evaluation  Patient identified by MRN, date of birth, ID band Patient awake    Reviewed: Allergy & Precautions, H&P , NPO status , Patient's Chart, lab work & pertinent test results  Airway Mallampati: I  TM Distance: >3 FB Neck ROM: full    Dental  (+) Teeth Intact, Dental Advidsory Given   Pulmonary former smoker,  breath sounds clear to auscultation        Cardiovascular negative cardio ROS  Rhythm:regular Rate:Normal     Neuro/Psych negative neurological ROS  negative psych ROS   GI/Hepatic negative GI ROS, Neg liver ROS,   Endo/Other  negative endocrine ROS  Renal/GU negative Renal ROS     Musculoskeletal   Abdominal   Peds  Hematology   Anesthesia Other Findings   Reproductive/Obstetrics negative OB ROS                             Anesthesia Physical Anesthesia Plan  ASA: II  Anesthesia Plan: General LMA   Post-op Pain Management:    Induction:   Airway Management Planned:   Additional Equipment:   Intra-op Plan:   Post-operative Plan:   Informed Consent: I have reviewed the patients History and Physical, chart, labs and discussed the procedure including the risks, benefits and alternatives for the proposed anesthesia with the patient or authorized representative who has indicated his/her understanding and acceptance.   Dental Advisory Given  Plan Discussed with: Anesthesiologist, CRNA and Surgeon  Anesthesia Plan Comments:         Anesthesia Quick Evaluation

## 2015-04-27 NOTE — Anesthesia Procedure Notes (Signed)
Procedure Name: LMA Insertion Date/Time: 04/27/2015 10:22 AM Performed by: Maryella Shivers Pre-anesthesia Checklist: Patient identified, Emergency Drugs available, Suction available and Patient being monitored Patient Re-evaluated:Patient Re-evaluated prior to inductionOxygen Delivery Method: Circle System Utilized Preoxygenation: Pre-oxygenation with 100% oxygen Intubation Type: IV induction Ventilation: Mask ventilation without difficulty LMA: LMA inserted LMA Size: 4.0 Number of attempts: 1 Airway Equipment and Method: Bite block Placement Confirmation: positive ETCO2 Tube secured with: Tape Dental Injury: Teeth and Oropharynx as per pre-operative assessment

## 2015-04-27 NOTE — Op Note (Signed)
Preoperative diagnosis: Stage II right breast cancer, her 2 positive Postoperative diagnosis: Same as above Procedure: Right internal jugular ultrasound-guided powerport placement Surgeon: Dr. Serita Grammes Anesthesia: Gen. Consultations: None Specimens: None Drains: None Sponge needle count was correct at completion Disposition to recovery stable  Indications: This is a 49 year female with her 2 positive breast cancer. She was seen in our multidisciplinary clinic and we have elected to proceed with primary systemic therapy.  I discussed port placement prior to beginning.  Procedure: After informed consent was obtained the patient was taken to the operating room. She was given antibiotics. Sequential compression devices were placed on her legs. She was then placed under general anesthesia with an LMA. She was then prepped and draped in the standard sterile surgical fashion. A surgical timeout was then performed.  I located the internal jugular vein using the ultrasound. Pictures were taken. I accessed this with a needle under direct vision. I then inserted the wire. This was confirmed to be in correct position with fluoroscopy. I then made a pocket on the right chest. I tunneled the line between the entry site and the pocket. I then placed the peel-away sheath and dilator under direct vision. The wire assembly was removed. I then placed the line and removed the sheath. I pulled the line back to be in the distal cava. There were no arrhythmias. I then attached this to the port. I sutured the port and 2 positions with 2-0 Prolene suture. This flushed easily and aspirated blood. I placed heparin in the port. I then closed this with 3-0 Vicryl, 4-0 Monocryl, and glue. She tolerated this well and was transferred to recovery.

## 2015-04-28 ENCOUNTER — Telehealth: Payer: Self-pay | Admitting: *Deleted

## 2015-04-28 NOTE — Telephone Encounter (Signed)
Spoke with patient from [facility] 04/21/15.  She is doing well.  She is aware of all her appointments.  Encouraged her to call with any needs or concerns.

## 2015-04-29 ENCOUNTER — Encounter: Payer: Self-pay | Admitting: Oncology

## 2015-04-29 ENCOUNTER — Other Ambulatory Visit (HOSPITAL_BASED_OUTPATIENT_CLINIC_OR_DEPARTMENT_OTHER): Payer: BLUE CROSS/BLUE SHIELD

## 2015-04-29 ENCOUNTER — Other Ambulatory Visit: Payer: Self-pay | Admitting: *Deleted

## 2015-04-29 ENCOUNTER — Encounter: Payer: Self-pay | Admitting: *Deleted

## 2015-04-29 ENCOUNTER — Encounter (HOSPITAL_BASED_OUTPATIENT_CLINIC_OR_DEPARTMENT_OTHER): Payer: Self-pay | Admitting: General Surgery

## 2015-04-29 ENCOUNTER — Other Ambulatory Visit: Payer: BLUE CROSS/BLUE SHIELD

## 2015-04-29 ENCOUNTER — Ambulatory Visit (HOSPITAL_BASED_OUTPATIENT_CLINIC_OR_DEPARTMENT_OTHER): Payer: BLUE CROSS/BLUE SHIELD | Admitting: Nurse Practitioner

## 2015-04-29 VITALS — BP 95/79 | HR 100 | Temp 98.4°F | Resp 18 | Ht 68.0 in | Wt 180.2 lb

## 2015-04-29 DIAGNOSIS — C50412 Malignant neoplasm of upper-outer quadrant of left female breast: Secondary | ICD-10-CM

## 2015-04-29 DIAGNOSIS — M79601 Pain in right arm: Secondary | ICD-10-CM | POA: Diagnosis not present

## 2015-04-29 DIAGNOSIS — Z170 Estrogen receptor positive status [ER+]: Secondary | ICD-10-CM | POA: Diagnosis not present

## 2015-04-29 DIAGNOSIS — G43009 Migraine without aura, not intractable, without status migrainosus: Secondary | ICD-10-CM

## 2015-04-29 LAB — CBC WITH DIFFERENTIAL/PLATELET
BASO%: 0.4 % (ref 0.0–2.0)
BASOS ABS: 0 10*3/uL (ref 0.0–0.1)
EOS%: 2.2 % (ref 0.0–7.0)
Eosinophils Absolute: 0.2 10*3/uL (ref 0.0–0.5)
HEMATOCRIT: 43.5 % (ref 34.8–46.6)
HGB: 14.7 g/dL (ref 11.6–15.9)
LYMPH%: 39.5 % (ref 14.0–49.7)
MCH: 33.1 pg (ref 25.1–34.0)
MCHC: 33.8 g/dL (ref 31.5–36.0)
MCV: 98 fL (ref 79.5–101.0)
MONO#: 0.7 10*3/uL (ref 0.1–0.9)
MONO%: 5.8 % (ref 0.0–14.0)
NEUT%: 52.1 % (ref 38.4–76.8)
NEUTROS ABS: 5.8 10*3/uL (ref 1.5–6.5)
PLATELETS: 283 10*3/uL (ref 145–400)
RBC: 4.44 10*6/uL (ref 3.70–5.45)
RDW: 11.7 % (ref 11.2–14.5)
WBC: 11.2 10*3/uL — AB (ref 3.9–10.3)
lymph#: 4.4 10*3/uL — ABNORMAL HIGH (ref 0.9–3.3)

## 2015-04-29 LAB — COMPREHENSIVE METABOLIC PANEL (CC13)
ALK PHOS: 61 U/L (ref 40–150)
ALT: 13 U/L (ref 0–55)
ANION GAP: 13 meq/L — AB (ref 3–11)
AST: 13 U/L (ref 5–34)
Albumin: 4 g/dL (ref 3.5–5.0)
BILIRUBIN TOTAL: 0.27 mg/dL (ref 0.20–1.20)
BUN: 12.8 mg/dL (ref 7.0–26.0)
CO2: 25 meq/L (ref 22–29)
CREATININE: 0.7 mg/dL (ref 0.6–1.1)
Calcium: 9.3 mg/dL (ref 8.4–10.4)
Chloride: 103 mEq/L (ref 98–109)
EGFR: >90 mL/min/{1.73_m2} (ref >90)
GLUCOSE: 93 mg/dL (ref 70–140)
Potassium: 4.1 mEq/L (ref 3.5–5.1)
Sodium: 140 mEq/L (ref 136–145)
Total Protein: 7.4 g/dL (ref 6.4–8.3)

## 2015-04-29 MED ORDER — ONDANSETRON HCL 8 MG PO TABS: 8.0000 mg | ORAL_TABLET | Freq: Two times a day (BID) | ORAL | Status: DC

## 2015-04-29 MED ORDER — PROCHLORPERAZINE MALEATE 10 MG PO TABS: 10.0000 mg | ORAL_TABLET | Freq: Four times a day (QID) | ORAL | Status: DC | PRN

## 2015-04-29 MED ORDER — DEXAMETHASONE 4 MG PO TABS: 8.0000 mg | ORAL_TABLET | Freq: Two times a day (BID) | ORAL | Status: DC

## 2015-04-29 MED ORDER — LORATADINE 10 MG PO TABS: 10.0000 mg | ORAL_TABLET | Freq: Every day | ORAL | Status: DC

## 2015-04-29 MED ORDER — LIDOCAINE-PRILOCAINE 2.5-2.5 % EX CREA: TOPICAL_CREAM | CUTANEOUS | Status: DC

## 2015-04-29 NOTE — Progress Notes (Signed)
[place]  [phone] [phone]) [phone]     ID: Cynthia [name] DOB: 11/27/1966  MR#: [id number]  BMW#:[id number]  Patient Care Team: No Pcp Per Patient as PCP - General (General Practice) Rolm Bookbinder, MD as Consulting Physician (General Surgery) Chauncey Cruel, MD as Consulting Physician (Oncology) Kyung Rudd, MD as Consulting Physician (Radiation Oncology) Mauro Kaufmann, RN as Registered Nurse Rockwell Germany, RN as Registered Nurse PCP: No PCP Per Patient OTHER MD: Christene Slates M.D.  CHIEF COMPLAINT: Triple positive breast cancer  CURRENT TREATMENT: Neoadjuvant chemotherapy   BREAST CANCER HISTORY: Cynthia [name] had noted a change in her left breast on self-exam the last week in March. She brought it to Dr. Trellis Paganini attention and on 04/13/2015 underwent diagnostic bilateral mammography and left ultrasonography at [facility]. This found the breast composition to be category D. An area of architectural distortion was noted in the left breast upper outer quadrant correlating to the palpable mass. By ultrasound there was a 2 cm lobulated mass in the left breast upper outer quadrant which was hypoechoic. There was a 4 mm nodule immediately adjacent to this felt to be a satellite lesion.  Biopsy of the left breast mass in question 04/14/2015 showed ([id number]) invasive ductal carcinoma, grade 2, estrogen receptor 90% positive, progesterone receptor 90% positive, with an MIB-1 of 20%, and HER-2 amplified, the signals ratio being 5.36.  The patient's subsequent history is as detailed below  INTERVAL HISTORY: Consepcion returns today for follow up of her breast cancer. She attended chemotherapy school this morning and had her port placed on Tuesday. She is here to discuss her antiemetic regimen and how to manage a host of other chemotherapy side effects. She is healing well from the port placement. However,  for the past few days she has noticed her right hand has been cool  and she has experienced shooting pains that start in the wrists and work their way up. Then she has right upper shoulder pain, but figures that was from positioning of her arm on the table during surgery. She has tried to make contact with Dr. Cristal [name] office, but has not been able to get in touch with him.  REVIEW OF SYSTEMS: The patient has a long history of headaches , possibly migraines, but denies visual changes, nausea, vomiting, stiff neck, dizziness, or gait imbalance. There has been no cough, phlegm production, or pleurisy, no chest pain or pressure, and no change in bowel or bladder habits. The patient denies fever, rash, bleeding, unexplained fatigue or unexplained weight loss. She does admit to some insomnia, seasonal allergy symptoms, mild heartburn, stress urinary incontinence, hot flashes, and anxiety. A detailed review of systems was otherwise entirely negative.     [name]: Past Medical History  Diagnosis Date  . 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 Anxiety   . Hot flashes    melanoma, removed in [place]   PAST SURGICAL HISTORY: Past Surgical History  Procedure Laterality Date  . Bladder suspension  2009  . Dnc    . Dilation and curettage of uterus    . Portacath placement Right 04/27/2015    Procedure: INSERTION PORT-A-CATH;  Surgeon: Rolm Bookbinder, MD;  Location: [place];  Service: General;  Laterality: Right;    FAMILY HISTORY Family History  Problem Relation Age of Onset  . Breast cancer Maternal Aunt   . Lung cancer Maternal Grandmother   . Lung cancer Paternal Grandfather  the patient's parents are still living as of April 2016. They are divorced. The patient is an only child. The patient's paternal grandfather and maternal grandmother had a history of lung cancer. On the mother's side there is a history of melanoma. The patient's mother's only sister was diagnosed with breast cancer  in her 9s. There is no history of ovarian cancer in the family.   GYNECOLOGIC HISTORY:  Patient's last menstrual period was 04/23/2015.  menarche age 29, first live birth age 46, the patient is GX P3. Her periods are ongoing but irregular. She has a Mirena in place for contraception. She took oral contraceptives for approximately 15 years in the past with no complications.   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 She is single, and lives at home with her father, who is disabled and has a history of depression. At home also is the patient's Berta [name] "Cynthia [name], 50 years old as of April 2016. The patient's 2 other daughters are Cynthia [name], lives in [place] and is a physical therapy technician, and Cynthia [name], lives in [place] and works in Press photographer.     ADVANCED DIRECTIVES:  not in place.    HEALTH MAINTENANCE: History  Substance Use Topics  . Smoking status: Former Smoker    Types: Cigarettes    Quit date: 04/22/2009  . Smokeless tobacco: Not on file  . Alcohol Use: Yes     Comment: occasion     Colonoscopy: never   PAP:  Bone density: never   Lipid panel:  Allergies  Allergen Reactions  . Adhesive [Tape] Rash    Steri strip    Current Outpatient Prescriptions  Medication Sig Dispense Refill  . ALPRAZolam (XANAX) 1 MG tablet     . Biotin 5000 MCG CAPS Take by mouth.    . Ginkgo Biloba 120 MG CAPS Take by mouth.    . Ginseng 100 MG CAPS Take by mouth.    Marland Kitchen HYDROcodone-acetaminophen (NORCO/VICODIN) 5-325 MG per tablet Take 1 tablet by mouth every 6 (six) hours as needed for moderate pain or severe pain. (Patient not taking: Reported on 04/29/2015) 15 tablet 0  . Lysine 1000 MG TABS Take by mouth.    . Multiple Vitamins-Minerals (CENTRUM SILVER PO) Take by mouth.    . Omega-3 Fatty Acids (OMEGA-3 FISH OIL PO) Take by mouth.    Francella Solian Johns Wort 300 MG CAPS Take by mouth.    Marland Kitchen UNABLE TO FIND 200 mg. Gaba plus     No current  facility-administered medications for this visit.    OBJECTIVE: middle-aged white woman appears stated age  74 Vitals:   04/29/15 1526  BP: 95/79  Pulse: 100  Temp: 98.4 F (36.9 C)  Resp: 18     Body mass index is 27.41 kg/(m^2).    ECOG FS:0 - Asymptomatic  Skin: warm, dry  HEENT: sclerae anicteric, conjunctivae pink, oropharynx clear. No thrush or mucositis.  Lymph Nodes: No cervical or supraclavicular lymphadenopathy  Lungs: clear to auscultation bilaterally, no rales, wheezes, or rhonci  Heart: regular rate and rhythm  Abdomen: round, soft, non tender, positive bowel sounds  Musculoskeletal: No focal spinal tenderness, no peripheral edema, right arm has no swelling or coolness at this time  Neuro: non focal, well oriented, positive affect  Breasts: deferred   LAB RESULTS:  CMP     Component Value Date/Time   NA 140 04/29/2015 1455   K 4.1 04/29/2015 1455  CO2 25 04/29/2015 1455   GLUCOSE 93 04/29/2015 1455   BUN 12.8 04/29/2015 1455   CREATININE 0.7 04/29/2015 1455   CALCIUM 9.3 04/29/2015 1455   PROT 7.4 04/29/2015 1455   ALBUMIN 4.0 04/29/2015 1455   AST 13 04/29/2015 1455   ALT 13 04/29/2015 1455   ALKPHOS 61 04/29/2015 1455   BILITOT 0.27 04/29/2015 1455    INo results found for: SPEP, UPEP  Lab Results  Component Value Date   WBC 11.2* 04/29/2015   NEUTROABS 5.8 04/29/2015   HGB 14.7 04/29/2015   HCT 43.5 04/29/2015   MCV 98.0 04/29/2015   PLT 283 04/29/2015      Chemistry      Component Value Date/Time   NA 140 04/29/2015 1455   K 4.1 04/29/2015 1455   CO2 25 04/29/2015 1455   BUN 12.8 04/29/2015 1455   CREATININE 0.7 04/29/2015 1455      Component Value Date/Time   CALCIUM 9.3 04/29/2015 1455   ALKPHOS 61 04/29/2015 1455   AST 13 04/29/2015 1455   ALT 13 04/29/2015 1455   BILITOT 0.27 04/29/2015 1455       No results found for: LABCA2  No components found for: LABCA125  No results for input(s): INR in the last 168  hours.  Urinalysis No results found for: COLORURINE, APPEARANCEUR, LABSPEC, PHURINE, GLUCOSEU, HGBUR, BILIRUBINUR, KETONESUR, PROTEINUR, UROBILINOGEN, NITRITE, LEUKOCYTESUR  STUDIES: Mr Breast Bilateral W Wo Contrast  04/28/2015   CLINICAL DATA:  Patient with recent diagnosis left breast invasive ductal carcinoma and ductal carcinoma in situ.  EXAM: BILATERAL BREAST MRI WITH AND WITHOUT CONTRAST  TECHNIQUE: Multiplanar, multisequence MR images of both breasts were obtained prior to and following the intravenous administration of 16 ml of MultiHance.  THREE-DIMENSIONAL MR IMAGE RENDERING ON INDEPENDENT WORKSTATION:  Three-dimensional MR images were rendered by post-processing of the original MR data on an independent workstation. The three-dimensional MR images were interpreted, and findings are reported in the following complete MRI report for this study. Three dimensional images were evaluated at the independent DynaCad workstation  COMPARISON:  Previous exam(s).  FINDINGS: Breast composition: c.  Heterogeneous fibroglandular tissue.  Background parenchymal enhancement: Moderate.  Right breast: Multiple T2 bright cysts are demonstrated throughout the right breast. Many of the cysts demonstrate a peripheral rim of enhancement. No suspicious enhancing masses identified within the right breast.  Left breast: Within upper-outer left breast posterior depth there is a 1.7 x 1.7 x 1.8 cm irregular enhancing mass compatible with recently biopsied left breast invasive ductal carcinoma. Central susceptibility artifact compatible with biopsy marking clip. No additional suspicious enhancing masses are identified within the left breast. Multiple T2 bright cysts are demonstrated throughout the left breast. Many of the cysts demonstrate a peripheral rim enhancement.  Lymph nodes: No abnormal appearing lymph nodes.  Ancillary findings:  None.  IMPRESSION: Biopsy-proven left breast carcinoma upper-outer quadrant posterior  depth.  No additional suspicious areas of enhancement within either breast.  RECOMMENDATION: Treatment plan.  BI-RADS CATEGORY  6: Known biopsy-proven malignancy.   Electronically Signed   By: Lovey Newcomer M.D.   On: 04/28/2015 15:05   Dg Chest Port 1 View  04/27/2015   CLINICAL DATA:  49 year old female status post porta cath placement. Initial encounter. Breast cancer.  EXAM: PORTABLE CHEST - 1 VIEW  COMPARISON:  None.  FINDINGS: Portable AP semi upright view at 1151 hours. Right chest IJ approach porta cath in place. Catheter tip projects just below the carina at the level of  the SVC. No pneumothorax. Normal cardiac size and mediastinal contours. Visualized tracheal air column is within normal limits. Mild perihilar and retrocardiac opacity on the left most resembles atelectasis.  IMPRESSION: 1. Right chest IJ approach porta cath placed with no adverse features. Catheter tip at the SVC level. 2. Left lung atelectasis suspected.   Electronically Signed   By: Genevie Ann M.D.   On: 04/27/2015 11:38   Dg Fluoro Guide Cv Line-no Report  04/27/2015   CLINICAL DATA:    FLOURO GUIDE CV LINE  Fluoroscopy was utilized by the requesting physician.  No radiographic  interpretation.     ASSESSMENT: 49 y.o. [place] woman status post left breast biopsy 04/14/2015 for a clinical T2 N0, stage IIA invasive ductal carcinoma, grade 2, estrogen and progesterone receptor positive, with HER-2 amplified, and an MIB-1 of 20%  (1). Neoadjuvant chemotherapy will consist of carboplatin, docetaxel, trastuzumab and pertuzumab every 21 days 6, with onpro support  (2) trastuzumab to be continued to total one year  (3) surgery to follow chemotherapy  (4) radiation to follow surgery  (5) anti-estrogens to follow radiation  (6) genetics testing pending  PLAN: Kyera and I spent about 40 minutes discussing her upcoming chemotherapy plans. She was made aware of potential side effects and toxicities of the carboplatin, docetaxel,  trastuzumab and pertuzumab. She attended chemotherapy school earlier today, so a lot of this information was a review for her. She was made aware of her neulasta injections, including the fact that these will be administered via an onbody injectior that she will need to wear until the device is no longer active.   Finally, we reviewed her antiemetic schedule, and she feels comfortable with every medication listed as well as the indication and potential side effects. These prescriptions were sent to her pharmacy today.   For her right arm coolness and shooting pain, I think it would be prudent to rule out a DVT. She will have one tomorrow before her baseline echocardiogram is performed. We discussed the need for echocardiograms every 3 months while on the trastuzumab.  Rekisha will return next Thursday for her first cycle of treatment. She understands and agrees with this plan. She knows the goal of treatment in her case is cure. She has been encouraged to call with any issues that might arise before her next visit here.   Laurie Panda, NP   04/29/2015 5:35 PM

## 2015-04-29 NOTE — Progress Notes (Signed)
Enrolled pt in the Neulasta First Step program.  Faxed signed form and activated card today.  °

## 2015-04-30 ENCOUNTER — Telehealth: Payer: Self-pay | Admitting: *Deleted

## 2015-04-30 ENCOUNTER — Ambulatory Visit (HOSPITAL_COMMUNITY)
Admission: RE | Admit: 2015-04-30 | Discharge: 2015-04-30 | Disposition: A | Discharge status: Home / Self Care | Payer: BLUE CROSS/BLUE SHIELD | Source: Ambulatory Visit | Attending: Oncology | Admitting: Oncology

## 2015-04-30 ENCOUNTER — Other Ambulatory Visit: Payer: Self-pay | Admitting: Oncology

## 2015-04-30 ENCOUNTER — Ambulatory Visit (HOSPITAL_BASED_OUTPATIENT_CLINIC_OR_DEPARTMENT_OTHER)
Admission: RE | Admit: 2015-04-30 | Discharge: 2015-04-30 | Disposition: A | Discharge status: Home / Self Care | Payer: BLUE CROSS/BLUE SHIELD | Source: Ambulatory Visit | Attending: Oncology | Admitting: Oncology

## 2015-04-30 ENCOUNTER — Encounter: Payer: Self-pay | Admitting: General Practice

## 2015-04-30 DIAGNOSIS — C50412 Malignant neoplasm of upper-outer quadrant of left female breast: Secondary | ICD-10-CM

## 2015-04-30 DIAGNOSIS — M79601 Pain in right arm: Secondary | ICD-10-CM | POA: Diagnosis not present

## 2015-04-30 DIAGNOSIS — Z01818 Encounter for other preprocedural examination: Secondary | ICD-10-CM | POA: Diagnosis not present

## 2015-04-30 NOTE — Progress Notes (Signed)
After speaking with Cynthia [name] by phone, I saw her today while she was visiting with Cynthia [name]/Cynthia [name].  Per pt, one significant stressor is fielding her mom's worry/anxiety about pt's dx/tx while also managing her own.  Pt's mom lives 90 minutes away, and physical needs further complicate the possibility of mom's seeking support at [facility].  With Cynthia [name]'s enthusiastic permission, I wrote her mom, Cynthia [name], a note (including my card and Spiritual Care brochure) to offer support and a safe place (even by phone) to process how she's doing and coping.  Plan to f/u with Cynthia [name] in infusion room on Th 5/12 for her first day of chemo.  Please also page as needs arise.  Thank you.  Robie [place], [place]

## 2015-04-30 NOTE — Progress Notes (Signed)
*  PRELIMINARY RESULTS* Vascular Ultrasound Right upper extremity venous duplex has been completed.  Preliminary findings:  No evidence of DVT or superficial thrombosis.    Called results to [place].  Landry Mellow, RDMS, RVT  04/30/2015, 9:35 AM

## 2015-04-30 NOTE — Telephone Encounter (Signed)
Call received from [facility] with vascular lab stating pt is negative for DVT.  Pt will be sent home.  This note will be sent to HB/NP and MD.

## 2015-04-30 NOTE — Progress Notes (Signed)
Echocardiogram 2D Echocardiogram has been performed.  Cynthia [name] 04/30/2015, 10:27 AM

## 2015-05-06 ENCOUNTER — Encounter: Payer: Self-pay | Admitting: General Practice

## 2015-05-06 ENCOUNTER — Encounter: Payer: Self-pay | Admitting: *Deleted

## 2015-05-06 ENCOUNTER — Other Ambulatory Visit (HOSPITAL_BASED_OUTPATIENT_CLINIC_OR_DEPARTMENT_OTHER): Payer: BLUE CROSS/BLUE SHIELD

## 2015-05-06 ENCOUNTER — Other Ambulatory Visit: Payer: Self-pay | Admitting: Oncology

## 2015-05-06 ENCOUNTER — Ambulatory Visit (HOSPITAL_BASED_OUTPATIENT_CLINIC_OR_DEPARTMENT_OTHER): Payer: BLUE CROSS/BLUE SHIELD

## 2015-05-06 VITALS — BP 133/74 | HR 88 | Temp 97.9°F | Resp 20

## 2015-05-06 DIAGNOSIS — Z5112 Encounter for antineoplastic immunotherapy: Secondary | ICD-10-CM

## 2015-05-06 DIAGNOSIS — Z5111 Encounter for antineoplastic chemotherapy: Secondary | ICD-10-CM

## 2015-05-06 DIAGNOSIS — G43009 Migraine without aura, not intractable, without status migrainosus: Secondary | ICD-10-CM

## 2015-05-06 DIAGNOSIS — C50412 Malignant neoplasm of upper-outer quadrant of left female breast: Secondary | ICD-10-CM | POA: Diagnosis not present

## 2015-05-06 LAB — CBC WITH DIFFERENTIAL/PLATELET
BASO%: 0.1 % (ref 0.0–2.0)
Basophils Absolute: 0 10*3/uL (ref 0.0–0.1)
EOS ABS: 0 10*3/uL (ref 0.0–0.5)
EOS%: 0 % (ref 0.0–7.0)
HCT: 42 % (ref 34.8–46.6)
HGB: 14.5 g/dL (ref 11.6–15.9)
LYMPH#: 1.7 10*3/uL (ref 0.9–3.3)
LYMPH%: 12 % — ABNORMAL LOW (ref 14.0–49.7)
MCH: 33.3 pg (ref 25.1–34.0)
MCHC: 34.5 g/dL (ref 31.5–36.0)
MCV: 96.6 fL (ref 79.5–101.0)
MONO#: 0.9 10*3/uL (ref 0.1–0.9)
MONO%: 6.4 % (ref 0.0–14.0)
NEUT%: 81.5 % — AB (ref 38.4–76.8)
NEUTROS ABS: 11.5 10*3/uL — AB (ref 1.5–6.5)
Platelets: 237 10*3/uL (ref 145–400)
RBC: 4.35 10*6/uL (ref 3.70–5.45)
RDW: 11.8 % (ref 11.2–14.5)
WBC: 14 10*3/uL — AB (ref 3.9–10.3)

## 2015-05-06 LAB — COMPREHENSIVE METABOLIC PANEL (CC13)
ALBUMIN: 3.7 g/dL (ref 3.5–5.0)
ALK PHOS: 62 U/L (ref 40–150)
ALT: 14 U/L (ref 0–55)
ANION GAP: 11 meq/L (ref 3–11)
AST: 14 U/L (ref 5–34)
BUN: 14.2 mg/dL (ref 7.0–26.0)
CALCIUM: 9.1 mg/dL (ref 8.4–10.4)
CO2: 21 mEq/L — ABNORMAL LOW (ref 22–29)
Chloride: 108 mEq/L (ref 98–109)
Creatinine: 0.8 mg/dL (ref 0.6–1.1)
EGFR: >90 mL/min/{1.73_m2} (ref >90)
GLUCOSE: 120 mg/dL (ref 70–140)
POTASSIUM: 4.1 meq/L (ref 3.5–5.1)
Sodium: 141 mEq/L (ref 136–145)
TOTAL PROTEIN: 6.9 g/dL (ref 6.4–8.3)
Total Bilirubin: 0.37 mg/dL (ref 0.20–1.20)

## 2015-05-06 MED ORDER — SODIUM CHLORIDE 0.9 % IV SOLN
840.0000 mg | Freq: Once | INTRAVENOUS | Status: AC
  Administered 2015-05-06: 840 mg via INTRAVENOUS
  Filled 2015-05-06: qty 28

## 2015-05-06 MED ORDER — PEGFILGRASTIM 6 MG/0.6ML ~~LOC~~ PSKT
6.0000 mg | PREFILLED_SYRINGE | Freq: Once | SUBCUTANEOUS | Status: AC
  Administered 2015-05-06: 6 mg via SUBCUTANEOUS
  Filled 2015-05-06: qty 0.6

## 2015-05-06 MED ORDER — DIPHENHYDRAMINE HCL 25 MG PO CAPS
ORAL_CAPSULE | ORAL | Status: AC
  Filled 2015-05-06: qty 1

## 2015-05-06 MED ORDER — DIPHENHYDRAMINE HCL 25 MG PO CAPS
25.0000 mg | ORAL_CAPSULE | Freq: Once | ORAL | Status: AC
  Administered 2015-05-06: 25 mg via ORAL

## 2015-05-06 MED ORDER — SODIUM CHLORIDE 0.9 % IV SOLN
Freq: Once | INTRAVENOUS | Status: AC
  Administered 2015-05-06: 10:00:00 via INTRAVENOUS

## 2015-05-06 MED ORDER — SODIUM CHLORIDE 0.9 % IJ SOLN
10.0000 mL | INTRAMUSCULAR | Status: DC | PRN
  Administered 2015-05-06: 10 mL
  Filled 2015-05-06: qty 10

## 2015-05-06 MED ORDER — DOCETAXEL CHEMO INJECTION 160 MG/16ML
75.0000 mg/m2 | Freq: Once | INTRAVENOUS | Status: AC
  Administered 2015-05-06: 150 mg via INTRAVENOUS
  Filled 2015-05-06: qty 15

## 2015-05-06 MED ORDER — ACETAMINOPHEN 325 MG PO TABS
ORAL_TABLET | ORAL | Status: AC
  Filled 2015-05-06: qty 2

## 2015-05-06 MED ORDER — ACETAMINOPHEN 325 MG PO TABS
650.0000 mg | ORAL_TABLET | Freq: Once | ORAL | Status: AC
  Administered 2015-05-06: 650 mg via ORAL

## 2015-05-06 MED ORDER — HEPARIN SOD (PORK) LOCK FLUSH 100 UNIT/ML IV SOLN
500.0000 [IU] | Freq: Once | INTRAVENOUS | Status: AC | PRN
  Administered 2015-05-06: 500 [IU]
  Filled 2015-05-06: qty 5

## 2015-05-06 MED ORDER — TRASTUZUMAB CHEMO INJECTION 440 MG
8.0000 mg/kg | Freq: Once | INTRAVENOUS | Status: AC
  Administered 2015-05-06: 651 mg via INTRAVENOUS
  Filled 2015-05-06: qty 31

## 2015-05-06 MED ORDER — CARBOPLATIN CHEMO INJECTION 600 MG/60ML
600.0000 mg | Freq: Once | INTRAVENOUS | Status: AC
  Administered 2015-05-06: 600 mg via INTRAVENOUS
  Filled 2015-05-06: qty 60

## 2015-05-06 MED ORDER — SODIUM CHLORIDE 0.9 % IV SOLN
Freq: Once | INTRAVENOUS | Status: AC
  Administered 2015-05-06: 14:00:00 via INTRAVENOUS
  Filled 2015-05-06: qty 8

## 2015-05-06 NOTE — Progress Notes (Signed)
Saw patient today with her 1st chemo treatment.  She is doing well.  Encouraged her to call with any needs or concerns.

## 2015-05-06 NOTE — Progress Notes (Signed)
Spiritual Care Note  Followed up with Cynthia [name] in infusion room per prior arrangement.  [name] acknowledged chaplain's dependability with appreciation, verbalizing in detail her gratitude for the level of care and support that she receives consistently at [facility].  Provided spiritual hospitality and served as pt's witness as she shared and processed her diagnosis, chemo experience, support from work, family relationships, and other sources of both meaning and distress.  Particularly special to her is a new practice of sharing a mother-daughter journal with her teenager to help them share and process during this challenging time in each of their lives.  Normalized feelings, affirmed coping strategies, encouraged self-care, and made space for Cynthia [name] to explore some of her fears and vulnerabilities while also naming strengths and positives in her life.  Pt verbalized gratitude and plans future visits to chaplain and [facility] in [place].    Following, but please also page as needs arise.  Thank you.  [place], [place]

## 2015-05-06 NOTE — Patient Instructions (Signed)
[facility] Discharge Instructions for Patients Receiving Chemotherapy  Today you received the following chemotherapy agents  Herceptin, Perjeta, Taxotere,Carboplatin  To help prevent nausea and vomiting after your treatment, we encourage you to take your nausea medication, as  directed   If you develop nausea and vomiting that is not controlled by your nausea medication, call the clinic.   BELOW ARE SYMPTOMS THAT SHOULD BE REPORTED IMMEDIATELY:  *FEVER GREATER THAN 100.5 F  *CHILLS WITH OR WITHOUT FEVER  NAUSEA AND VOMITING THAT IS NOT CONTROLLED WITH YOUR NAUSEA MEDICATION  *UNUSUAL SHORTNESS OF BREATH  *UNUSUAL BRUISING OR BLEEDING  TENDERNESS IN MOUTH AND THROAT WITH OR WITHOUT PRESENCE OF ULCERS  *URINARY PROBLEMS  *BOWEL PROBLEMS  UNUSUAL RASH Items with * indicate a potential emergency and should be followed up as soon as possible.  Feel free to call the clinic you have any questions or concerns. The clinic phone number is [phone].  Please show the [place] at check-in to the Emergency Department and triage nurse.

## 2015-05-07 ENCOUNTER — Telehealth: Payer: Self-pay | Admitting: *Deleted

## 2015-05-07 NOTE — Telephone Encounter (Signed)
PT. ASKED IF THE NEULASTA ONPRO KIT COULD GET WET? INFORMED PT. THE KIT MAY GET WET THE FIRST 24 HOURS IT IS ON HER BODY BUT THE NEXT THREE HOURS SHOULD REMAIN DRY BEFORE IN INJECTION TAKES PLACE. SHE VOICES UNDERSTANDING. PT. HAS HAD SOME NAUSEA WHICH WAS HELPED WITH HER ANTINAUSEA MEDICATION. NO VOMITING. PT. IS EATING AND FORCING FLUIDS. NO DIARRHEA OR MOUTH ISSUES. SHE WILL CALL IF THE NEED ARISES.

## 2015-05-10 ENCOUNTER — Telehealth: Payer: Self-pay | Admitting: *Deleted

## 2015-05-10 MED ORDER — FAMOTIDINE 20 MG PO TABS: 20.0000 mg | ORAL_TABLET | Freq: Two times a day (BID) | ORAL | Status: DC

## 2015-05-10 NOTE — Telephone Encounter (Signed)
THE PT. DOES NOT HAVE ANY MOUTH ISSUES. SHE HAS BURNING IN THE BACK OF HER THROAT. SPOKE TO DR.MAGRINAT'S NURSE, VAL DODD,RN. PT. MAY USE PEPCID 20MG  TWICE A DAY OR A PRESCRIPTION FOR CARAFATE. PT. WOULD LIKE TO TRY THE PEPCID FIRST.

## 2015-05-11 ENCOUNTER — Encounter: Payer: BLUE CROSS/BLUE SHIELD | Admitting: Nurse Practitioner

## 2015-05-11 ENCOUNTER — Telehealth: Payer: Self-pay | Admitting: *Deleted

## 2015-05-11 ENCOUNTER — Other Ambulatory Visit: Payer: Self-pay | Admitting: *Deleted

## 2015-05-11 ENCOUNTER — Telehealth: Payer: Self-pay | Admitting: Nurse Practitioner

## 2015-05-11 ENCOUNTER — Telehealth: Payer: Self-pay | Admitting: Oncology

## 2015-05-11 ENCOUNTER — Ambulatory Visit: Payer: BLUE CROSS/BLUE SHIELD

## 2015-05-11 NOTE — Telephone Encounter (Signed)
Spoke with patient to inform her that I was unaware that Retta Mac, NP is out today sick.  Dr. Virgie Dad nurse will discuss with him and call her back with instructions.  Patient verbalized understanding.

## 2015-05-11 NOTE — Telephone Encounter (Signed)
This RN spoke with pt per her prior call -  At present pt states her headache " is there but not as bad ".  She also states she is at work "and that keeps my mind off of it "  Per further inquiry Cynthia [name] states she usually take " Excedrin " for migraines.  She is able to hydrate well with water and Gatorade.  Per discussion this RN offered to bring pt in for IVF - but also discussed use of benadryl for headache induced by ondansetron.  Cynthia [name] states she would like to implement benadryl at home for headache.  Also discussed complaint of " burning in the back of my throat"  Per discussion above is from mid chest up throat and is worse when she reclines.  She has not started taking pepcid as advised yesterday and will start it now with 1 po bid until she is seen on 05/14/2015

## 2015-05-11 NOTE — Telephone Encounter (Signed)
Moved heather 7/1 appointment to [place] and place a note for patient to get a new schedule/avs today

## 2015-05-11 NOTE — Telephone Encounter (Signed)
Appointments made for today

## 2015-05-11 NOTE — Progress Notes (Signed)
appointment reconciled by val.

## 2015-05-11 NOTE — Telephone Encounter (Signed)
Received call from patient stating she feels like "there is a skewer going through my head."  She states she has headaches normally but this is really bad.  She also states her throat feels like it is burning.  Informed her it could be reflux or she may have some thrush.  Confirmed appointment for Retta Mac for 05/11/15 at 130 with labs at 1pm.

## 2015-05-12 ENCOUNTER — Ambulatory Visit (HOSPITAL_BASED_OUTPATIENT_CLINIC_OR_DEPARTMENT_OTHER): Payer: BLUE CROSS/BLUE SHIELD | Admitting: Nurse Practitioner

## 2015-05-12 ENCOUNTER — Ambulatory Visit (HOSPITAL_BASED_OUTPATIENT_CLINIC_OR_DEPARTMENT_OTHER): Payer: BLUE CROSS/BLUE SHIELD

## 2015-05-12 ENCOUNTER — Telehealth: Payer: Self-pay

## 2015-05-12 DIAGNOSIS — K209 Esophagitis, unspecified without bleeding: Secondary | ICD-10-CM | POA: Insufficient documentation

## 2015-05-12 DIAGNOSIS — R21 Rash and other nonspecific skin eruption: Secondary | ICD-10-CM | POA: Diagnosis not present

## 2015-05-12 DIAGNOSIS — E860 Dehydration: Secondary | ICD-10-CM | POA: Diagnosis not present

## 2015-05-12 DIAGNOSIS — C50412 Malignant neoplasm of upper-outer quadrant of left female breast: Secondary | ICD-10-CM | POA: Diagnosis not present

## 2015-05-12 DIAGNOSIS — G43009 Migraine without aura, not intractable, without status migrainosus: Secondary | ICD-10-CM

## 2015-05-12 LAB — COMPREHENSIVE METABOLIC PANEL (CC13)
ALT: 25 U/L (ref 0–55)
ANION GAP: 10 meq/L (ref 3–11)
AST: 17 U/L (ref 5–34)
Albumin: 3.6 g/dL (ref 3.5–5.0)
Alkaline Phosphatase: 92 U/L (ref 40–150)
BILIRUBIN TOTAL: 0.41 mg/dL (ref 0.20–1.20)
BUN: 9.4 mg/dL (ref 7.0–26.0)
CO2: 24 mEq/L (ref 22–29)
CREATININE: 0.8 mg/dL (ref 0.6–1.1)
Calcium: 9.3 mg/dL (ref 8.4–10.4)
Chloride: 103 mEq/L (ref 98–109)
EGFR: >90 mL/min/{1.73_m2} (ref >90)
Glucose: 106 mg/dl (ref 70–140)
Potassium: 4.1 mEq/L (ref 3.5–5.1)
Sodium: 138 mEq/L (ref 136–145)
Total Protein: 7 g/dL (ref 6.4–8.3)

## 2015-05-12 LAB — CBC WITH DIFFERENTIAL/PLATELET
BASO%: 0.8 % (ref 0.0–2.0)
Basophils Absolute: 0.1 10*3/uL (ref 0.0–0.1)
EOS ABS: 0.2 10*3/uL (ref 0.0–0.5)
EOS%: 3.4 % (ref 0.0–7.0)
HEMATOCRIT: 43.8 % (ref 34.8–46.6)
HGB: 14.6 g/dL (ref 11.6–15.9)
LYMPH%: 34.8 % (ref 14.0–49.7)
MCH: 32.7 pg (ref 25.1–34.0)
MCHC: 33.4 g/dL (ref 31.5–36.0)
MCV: 98.1 fL (ref 79.5–101.0)
MONO#: 0.6 10*3/uL (ref 0.1–0.9)
MONO%: 9.2 % (ref 0.0–14.0)
NEUT#: 3.3 10*3/uL (ref 1.5–6.5)
NEUT%: 51.8 % (ref 38.4–76.8)
Platelets: 159 10*3/uL (ref 145–400)
RBC: 4.47 10*6/uL (ref 3.70–5.45)
RDW: 11.8 % (ref 11.2–14.5)
WBC: 6.4 10*3/uL (ref 3.9–10.3)
lymph#: 2.2 10*3/uL (ref 0.9–3.3)

## 2015-05-12 NOTE — Assessment & Plan Note (Addendum)
Patient states that she developed a generalized rash that is pruritic wthin the past 24 hours.  She denies any other hypersensitivity reaction symptoms whatsoever.  On exam.-Patient has some scattered pink/red rash to her face, neck, chest and upper back regions only.  Most likely, rashes are secondary to chemotherapy.  Patient was advised to take Benadryl 25 mg every 6 hours and Pepcid 20 mg every 12 hours to help with rash.  Also, she may try some hydrocortisone cream to the rash as well.  She was advised to call/return to the directed to the emergency department for any worsening symptoms whatsoever.  Patient has plans to return for labs and a follow-up visit with Dr. Jana Hakim this coming Friday, 05/14/2015.

## 2015-05-12 NOTE — Telephone Encounter (Signed)
S/w pt, she is able to swallow. She is a bit SOB since her treatment. There is some heartburn going on, the pepcid does not seem to help. Her face is red and burning, her upper lip is pulling in and looks "weird". Lab and [facility] scheduled for today

## 2015-05-12 NOTE — Telephone Encounter (Signed)
Pt called stating she has a rash on her face and chest and it is burning, her headache was not helped by benadry. She described burning and broke out and drawing in.

## 2015-05-12 NOTE — Assessment & Plan Note (Addendum)
Pt received her first cycle of Taxotere/carboplatinum/Herceptin/Perjeta chemotherapy on Thursday, 05/06/2015.  She also received her Neulasta injection via pump for growth factor support following chemotherapy.  Patient states she is feeling a little more fatigued since her chemotherapy.  She also feels slightly dehydrated today.  Blood counts appear fairly stable today.  Of note-patient has developed a mild, generalized rash within the past 24 hours.  It does appear to be a chemotherapy-related rash.  Also, patient has a known tape/adhesive allergy; and does have some resolving rash from the adhesive of the Neulasta pump.  Patient is scheduled to return on Friday, 05/14/2015 for her next labs and follow up visit with Dr. Jana Hakim.

## 2015-05-13 ENCOUNTER — Telehealth: Payer: Self-pay | Admitting: Nurse Practitioner

## 2015-05-13 ENCOUNTER — Telehealth: Payer: Self-pay

## 2015-05-13 ENCOUNTER — Encounter: Payer: Self-pay | Admitting: Nurse Practitioner

## 2015-05-13 MED ORDER — SUCRALFATE 1 GM/10ML PO SUSP: 1.0000 g | Freq: Three times a day (TID) | ORAL | Status: DC

## 2015-05-13 NOTE — Telephone Encounter (Signed)
Called patient to check in with her.  She states that her rash is slightly improved while taking both Benadryl and Pepcid as directed.  She denies any other new hypersensitivity reaction symptoms whatsoever.  Also discussed with patient the possible need to forego any further Neulasta pump applications in the future due to adhesive reaction associated with the pump.  Patient scheduled to return to the [facility] tomorrow, 05/14/2015 for follow-up with Dr. Jana Hakim.  Patient's rash will be reevaluated by Dr. Jana Hakim at that time.  Also, Dr. Jana Hakim, will review options regarding Neulasta pump versus the Neulasta injection.  Patient stated understanding of all instructions; and was in agreement with this plan of care.

## 2015-05-13 NOTE — Assessment & Plan Note (Signed)
Patient complaining of some mild esophagitis as well.   On exam- no oral lesions or thrush noted.  Confirm the patient continues to use the Biotene mouth rinse as previously directed.  Also encouraged patient to continue with baking soda/salt swish and spit as well.  Confirmed that patient is taking Pepcid 20 mg twice daily.  Also, will prescribe Carafate for the patient to try as well for esophagitis diagnosis.

## 2015-05-13 NOTE — Progress Notes (Signed)
SYMPTOM MANAGEMENT CLINIC   HPI: Cynthia [name] 49 y.o. female diagnosed with breast cancer.  Currently undergoing Taxotere/carboplatin/Herceptin/Perjeta chemotherapy regimen.  Patient reports developing a rash that is pruritic.  Within the past 24 hours.  She denies any other hypersensitivity reaction symptoms whatsoever.  She is also noted a different type of rash around her Neulasta pump adhesive site; stating that she has an adhesive/tape allergy.  Patient is also complaining of some mild esophagitis symptoms as well.  Patient states she already takes Pepcid twice daily on a regular basis.  Patient also feels mildly dehydrated today; stating she has not been drinking enough fluids at home.  She denies any recent fevers or chills.   HPI  ROS  Past Medical History  Diagnosis Date  . 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 Anxiety   . Hot flashes     Past Surgical History  Procedure Laterality Date  . Bladder suspension  2009  . Dnc    . Dilation and curettage of uterus    . Portacath placement Right 04/27/2015    Procedure: INSERTION PORT-A-CATH;  Surgeon: Rolm Bookbinder, MD;  Location: [facility];  Service: General;  Laterality: Right;    has Breast cancer of upper-outer quadrant of left female breast; Migraines; Erroneous encounter - disregard; Rash; Dehydration; and Esophagitis on her problem list.    is allergic to adhesive.    Medication List       This list is accurate as of: 05/12/15 11:59 PM.  Always use your most recent med list.               ALPRAZolam 1 MG tablet  Commonly known as:  XANAX     Biotin 5000 MCG Caps  Take by mouth.     CENTRUM SILVER PO  Take by mouth.     dexamethasone 4 MG tablet  Commonly known as:  DECADRON  Take 2 tablets (8 mg total) by mouth 2 (two) times daily. Start the day before Taxotere. Then again the day after chemo for 3 days.     famotidine 20 MG tablet  Commonly  known as:  PEPCID  Take 1 tablet (20 mg total) by mouth 2 (two) times daily.     Ginkgo Biloba 120 MG Caps  Take by mouth.     Ginseng 100 MG Caps  Take by mouth.     HYDROcodone-acetaminophen 5-325 MG per tablet  Commonly known as:  NORCO/VICODIN  Take 1 tablet by mouth every 6 (six) hours as needed for moderate pain or severe pain.     lidocaine-prilocaine cream  Commonly known as:  EMLA  Apply to affected area once     loratadine 10 MG tablet  Commonly known as:  CLARITIN  Take 1 tablet (10 mg total) by mouth daily. Daily for 3-5 days starting on neulasta injection day     Lysine 1000 MG Tabs  Take by mouth.     OMEGA-3 FISH OIL PO  Take by mouth.     ondansetron 8 MG tablet  Commonly known as:  ZOFRAN  Take 1 tablet (8 mg total) by mouth 2 (two) times daily. Start the day after chemo for 3 days. Then take as needed for nausea or vomiting.     prochlorperazine 10 MG tablet  Commonly known as:  COMPAZINE  Take 1 tablet (10 mg total) by mouth every 6 (six) hours as needed (Nausea or vomiting).  [place] Wort 300 MG Caps  Take by mouth.     sucralfate 1 GM/10ML suspension  Commonly known as:  CARAFATE  Take 10 mLs (1 g total) by mouth 4 (four) times daily -  with meals and at bedtime.     UNABLE TO FIND  200 mg. Gaba plus         PHYSICAL EXAMINATION  Oncology Vitals 05/06/2015 05/06/2015 05/06/2015 05/06/2015 05/06/2015 05/06/2015 05/06/2015  Height - - - - - - -  Weight - - - - - - -  Weight (lbs) - - - - - - -  BMI (kg/m2) - - - - - - -  Temp 97.9 98.1 97.9 98.2 97.9 - -  Pulse 88 78 76 77 85 98 98  Resp '20 18 18 18 18 18 20  ' SpO2 99 98 97 97 98 99 99  BSA (m2) - - - - - - -   BP Readings from Last 3 Encounters:  05/06/15 133/74  04/29/15 95/79  04/27/15 121/79    Physical Exam  Constitutional: She is oriented to person, place, and time and well-developed, well-nourished, and in no distress.  HENT:  Head: Normocephalic and atraumatic.    Mouth/Throat: Oropharynx is clear and moist. No oropharyngeal exudate.  Eyes: Conjunctivae and EOM are normal. Pupils are equal, round, and reactive to light. Right eye exhibits no discharge. Left eye exhibits no discharge. No scleral icterus.  Neck: Normal range of motion. Neck supple. No JVD present. No tracheal deviation present. No thyromegaly present.  Cardiovascular: Normal rate, regular rhythm, normal heart sounds and intact distal pulses.   Pulmonary/Chest: Effort normal and breath sounds normal. No stridor. No respiratory distress. She has no wheezes. She has no rales. She exhibits no tenderness.  Abdominal: Soft. Bowel sounds are normal. She exhibits no distension and no mass. There is no tenderness. There is no rebound and no guarding.  Musculoskeletal: Normal range of motion. She exhibits no edema or tenderness.  Lymphadenopathy:    She has no cervical adenopathy.  Neurological: She is alert and oriented to person, place, and time. Gait normal.  Skin: Skin is warm and dry. Rash noted. No erythema. No pallor.  Patient has a scattered, pink/red rash to her face, neck, anterior chest and upper back.  No evidence of active infection to rash.  Also, patient has a circular rash around her previous Neulasta pump adhesive site.  It does appear to be resolving.  Psychiatric: Affect normal.  Nursing note and vitals reviewed.   LABORATORY DATA:. Appointment on 05/12/2015  Component Date Value Ref Range Status  . WBC 05/12/2015 6.4  3.9 - 10.3 10e3/uL Final  . NEUT# 05/12/2015 3.3  1.5 - 6.5 10e3/uL Final  . HGB 05/12/2015 14.6  11.6 - 15.9 g/dL Final  . HCT 05/12/2015 43.8  34.8 - 46.6 % Final  . Platelets 05/12/2015 159  145 - 400 10e3/uL Final  . MCV 05/12/2015 98.1  79.5 - 101.0 fL Final  . MCH 05/12/2015 32.7  25.1 - 34.0 pg Final  . MCHC 05/12/2015 33.4  31.5 - 36.0 g/dL Final  . RBC 05/12/2015 4.47  3.70 - 5.45 10e6/uL Final  . RDW 05/12/2015 11.8  11.2 - 14.5 % Final  .  lymph# 05/12/2015 2.2  0.9 - 3.3 10e3/uL Final  . MONO# 05/12/2015 0.6  0.1 - 0.9 10e3/uL Final  . Eosinophils Absolute 05/12/2015 0.2  0.0 - 0.5 10e3/uL Final  . Basophils Absolute 05/12/2015 0.1  0.0 -  0.1 10e3/uL Final  . NEUT% 05/12/2015 51.8  38.4 - 76.8 % Final  . LYMPH% 05/12/2015 34.8  14.0 - 49.7 % Final  . MONO% 05/12/2015 9.2  0.0 - 14.0 % Final  . EOS% 05/12/2015 3.4  0.0 - 7.0 % Final  . BASO% 05/12/2015 0.8  0.0 - 2.0 % Final  . Sodium 05/12/2015 138  136 - 145 mEq/L Final  . Potassium 05/12/2015 4.1  3.5 - 5.1 mEq/L Final  . Chloride 05/12/2015 103  98 - 109 mEq/L Final  . CO2 05/12/2015 24  22 - 29 mEq/L Final  . Glucose 05/12/2015 106  70 - 140 mg/dl Final  . BUN 05/12/2015 9.4  7.0 - 26.0 mg/dL Final  . Creatinine 05/12/2015 0.8  0.6 - 1.1 mg/dL Final  . Total Bilirubin 05/12/2015 0.41  0.20 - 1.20 mg/dL Final  . Alkaline Phosphatase 05/12/2015 92  40 - 150 U/L Final  . AST 05/12/2015 17  5 - 34 U/L Final  . ALT 05/12/2015 25  0 - 55 U/L Final  . Total Protein 05/12/2015 7.0  6.4 - 8.3 g/dL Final  . Albumin 05/12/2015 3.6  3.5 - 5.0 g/dL Final  . Calcium 05/12/2015 9.3  8.4 - 10.4 mg/dL Final  . Anion Gap 05/12/2015 10  3 - 11 mEq/L Final  . EGFR 05/12/2015 >90  >90 ml/min/1.73 m2 Final   eGFR is calculated using the CKD-EPI Creatinine Equation (2009)     RADIOGRAPHIC STUDIES: No results found.  ASSESSMENT/PLAN:    Breast cancer of upper-outer quadrant of left female breast Pt received her first cycle of Taxotere/carboplatinum/Herceptin/Perjeta chemotherapy on Thursday, 05/06/2015.  She also received her Neulasta injection via pump for growth factor support following chemotherapy.  Patient states she is feeling a little more fatigued since her chemotherapy.  She also feels slightly dehydrated today.  Blood counts appear fairly stable today.  Of note-patient has developed a mild, generalized rash within the past 24 hours.  It does appear to be a  chemotherapy-related rash.  Also, patient has a known tape/adhesive allergy; and does have some resolving rash from the adhesive of the Neulasta pump.  Patient is scheduled to return on Friday, 05/14/2015 for her next labs and follow up visit with Dr. Jana Hakim.     Rash Patient states that she developed a generalized rash that is pruritic wthin the past 24 hours.  She denies any other hypersensitivity reaction symptoms whatsoever.  On exam.-Patient has some scattered pink/red rash to her face, neck, chest and upper back regions only.  Most likely, rashes are secondary to chemotherapy.  Patient was advised to take Benadryl 25 mg every 6 hours and Pepcid 20 mg every 12 hours to help with rash.  Also, she may try some hydrocortisone cream to the rash as well.  She was advised to call/return to the directed to the emergency department for any worsening symptoms whatsoever.  Patient has plans to return for labs and a follow-up visit with Dr. Jana Hakim this coming Friday, 05/14/2015.     Dehydration Patient feels slightly dehydrated today.  She was encouraged to push fluids is much as possible.  She declined IV fluid rehydration today.   Esophagitis Patient complaining of some mild esophagitis as well.   On exam- no oral lesions or thrush noted.  Confirm the patient continues to use the Biotene mouth rinse as previously directed.  Also encouraged patient to continue with baking soda/salt swish and spit as well.  Confirmed that patient is  taking Pepcid 20 mg twice daily.  Also, will prescribe Carafate for the patient to try as well for esophagitis diagnosis.   Patient stated understanding of all instructions; and was in agreement with this plan of care. The patient knows to call the clinic with any problems, questions or concerns.   Review/collaboration with Dr. Jana Hakim regarding all aspects of patient's visit today.   Total time spent with patient was 25 minutes;  with greater than 75  percent of that time spent in face to face counseling regarding patient's symptoms,  and coordination of care and follow up.  Disclaimer: This note was dictated with voice recognition software. Similar sounding words can inadvertently be transcribed and may not be corrected upon review.   Drue Second, NP 05/13/2015

## 2015-05-13 NOTE — Assessment & Plan Note (Signed)
Patient feels slightly dehydrated today.  She was encouraged to push fluids is much as possible.  She declined IV fluid rehydration today.

## 2015-05-14 ENCOUNTER — Other Ambulatory Visit (HOSPITAL_BASED_OUTPATIENT_CLINIC_OR_DEPARTMENT_OTHER): Payer: BLUE CROSS/BLUE SHIELD

## 2015-05-14 ENCOUNTER — Other Ambulatory Visit: Payer: Self-pay | Admitting: *Deleted

## 2015-05-14 ENCOUNTER — Ambulatory Visit (HOSPITAL_BASED_OUTPATIENT_CLINIC_OR_DEPARTMENT_OTHER): Payer: BLUE CROSS/BLUE SHIELD | Admitting: Oncology

## 2015-05-14 VITALS — BP 129/75 | HR 86 | Temp 98.0°F | Resp 18 | Ht 68.0 in | Wt 178.4 lb

## 2015-05-14 DIAGNOSIS — G43009 Migraine without aura, not intractable, without status migrainosus: Secondary | ICD-10-CM

## 2015-05-14 DIAGNOSIS — L270 Generalized skin eruption due to drugs and medicaments taken internally: Secondary | ICD-10-CM

## 2015-05-14 DIAGNOSIS — C50412 Malignant neoplasm of upper-outer quadrant of left female breast: Secondary | ICD-10-CM | POA: Diagnosis not present

## 2015-05-14 LAB — CBC WITH DIFFERENTIAL/PLATELET
BASO%: 0.4 % (ref 0.0–2.0)
Basophils Absolute: 0.1 10*3/uL (ref 0.0–0.1)
EOS%: 0.4 % (ref 0.0–7.0)
Eosinophils Absolute: 0.1 10*3/uL (ref 0.0–0.5)
HCT: 39.8 % (ref 34.8–46.6)
HGB: 13.6 g/dL (ref 11.6–15.9)
LYMPH%: 20.5 % (ref 14.0–49.7)
MCH: 33 pg (ref 25.1–34.0)
MCHC: 34.2 g/dL (ref 31.5–36.0)
MCV: 96.6 fL (ref 79.5–101.0)
MONO#: 2.3 10*3/uL — AB (ref 0.1–0.9)
MONO%: 11.7 % (ref 0.0–14.0)
NEUT%: 67 % (ref 38.4–76.8)
NEUTROS ABS: 12.9 10*3/uL — AB (ref 1.5–6.5)
NRBC: 0 % (ref 0–0)
PLATELETS: 194 10*3/uL (ref 145–400)
RBC: 4.12 10*6/uL (ref 3.70–5.45)
RDW: 11.9 % (ref 11.2–14.5)
WBC: 19.3 10*3/uL — ABNORMAL HIGH (ref 3.9–10.3)
lymph#: 4 10*3/uL — ABNORMAL HIGH (ref 0.9–3.3)

## 2015-05-14 MED ORDER — MINOCYCLINE HCL 100 MG PO CAPS: 100.0000 mg | ORAL_CAPSULE | Freq: Two times a day (BID) | ORAL | Status: DC

## 2015-05-14 MED ORDER — CYCLOSPORINE 0.05 % OP EMUL: 1.0000 [drp] | Freq: Two times a day (BID) | OPHTHALMIC | Status: DC

## 2015-05-14 MED ORDER — UNABLE TO FIND: 1.0000 | Freq: Once | Status: DC

## 2015-05-14 NOTE — Progress Notes (Signed)
[place]  [phone] [phone]) [phone]     ID: Cynthia [name] DOB: 23-Jun-1966  MR#: [id number]  UPJ#:[id number]  Patient Care Team: No Pcp Per Patient as PCP - General (General Practice) Rolm Bookbinder, MD as Consulting Physician (General Surgery) Chauncey Cruel, MD as Consulting Physician (Oncology) Kyung Rudd, MD as Consulting Physician (Radiation Oncology) Mauro Kaufmann, RN as Registered Nurse Rockwell Germany, RN as Registered Nurse PCP: No PCP Per Patient OTHER MD: Christene Slates M.D.  CHIEF COMPLAINT: Triple positive breast cancer  CURRENT TREATMENT: Neoadjuvant chemotherapy   BREAST CANCER HISTORY: From the original intake note:  Cynthia [name] had noted a change in her left breast on self-exam the last week in March. She brought it to Dr. Trellis Paganini attention and on 04/13/2015 underwent diagnostic bilateral mammography and left ultrasonography at [facility]. This found the breast composition to be category D. An area of architectural distortion was noted in the left breast upper outer quadrant correlating to the palpable mass. By ultrasound there was a 2 cm lobulated mass in the left breast upper outer quadrant which was hypoechoic. There was a 4 mm nodule immediately adjacent to this felt to be a satellite lesion.  Biopsy of the left breast mass in question 04/14/2015 showed ([id number]) invasive ductal carcinoma, grade 2, estrogen receptor 90% positive, progesterone receptor 90% positive, with an MIB-1 of 20%, and HER-2 amplified, the signals ratio being 5.36.  The patient's subsequent history is as detailed below  INTERVAL HISTORY: Cynthia [name] returns today for follow up of her breast cancer. Today is day 9 cycle 1 of 6 cycles planned of carboplatin, docetaxel, trastuzumab and pertuzumab.  REVIEW OF SYSTEMS: She had a pretty rough time with the first cycle, although she is keeping a positive attitude. She did well on the first couple of days, aside from minor  queasiness. There was no vomiting. She was able to go to the gym on day 4. She went back to work on day 5. That evening though she started feeling short of breath and developed a severe headache. Of course she does have a past history of headaches. She stopped the Zofran at that point. Next day she developed a rash. This affects chiefly her face and upper chest. She continued to work despite this. She had diarrhea a few days, but now only a feeling of urgency. Bowel movements are "powerful" but "not all liquid". Her sense of taste is gone. She has a minimal sore throat. She has mild heartburn. A detailed review of systems was otherwise stable.    [name]: Past Medical History  Diagnosis Date  . 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 Anxiety   . Hot flashes    melanoma, removed in [place]   PAST SURGICAL HISTORY: Past Surgical History  Procedure Laterality Date  . Bladder suspension  2009  . Dnc    . Dilation and curettage of uterus    . Portacath placement Right 04/27/2015    Procedure: INSERTION PORT-A-CATH;  Surgeon: Rolm Bookbinder, MD;  Location: [facility];  Service: General;  Laterality: Right;    FAMILY HISTORY Family History  Problem Relation Age of Onset  . Breast cancer Maternal Aunt   . Lung cancer Maternal Grandmother   . Lung cancer Paternal Grandfather    the patient's parents are still living as of April 2016. They are divorced. The patient is an only child. The patient's paternal grandfather and maternal  grandmother had a history of lung cancer. On the mother's side there is a history of melanoma. The patient's mother's only sister was diagnosed with breast cancer in her 29s. There is no history of ovarian cancer in the family.   GYNECOLOGIC HISTORY:  Patient's last menstrual period was 04/23/2015.  menarche age 59, first live birth age 56, the patient is GX P3. Her periods are ongoing but irregular.  She has a Mirena in place for contraception. She took oral contraceptives for approximately 15 years in the past with no complications.   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 She is single, and lives at home with her father, who is disabled and has a history of depression. At home also is [name], Cynthia years old as of April 2016. The patient's 2 other daughters are Cynthia [name], lives in [place] and is a physical therapy technician, and Cynthia [name], lives in [facility] and works in Press photographer.     ADVANCED DIRECTIVES:  not in place.    HEALTH MAINTENANCE: History  Substance Use Topics  . Smoking status: Former Smoker    Types: Cigarettes    Quit date: 04/22/2009  . Smokeless tobacco: Not on file  . Alcohol Use: Yes     Comment: occasion     Colonoscopy: never   PAP:  Bone density: never   Lipid panel:  Allergies  Allergen Reactions  . Adhesive [Tape] Rash    Steri strip    Current Outpatient Prescriptions  Medication Sig Dispense Refill  . ALPRAZolam (XANAX) 1 MG tablet     . Biotin 5000 MCG CAPS Take by mouth.    . dexamethasone (DECADRON) 4 MG tablet Take 2 tablets (8 mg total) by mouth 2 (two) times daily. Start the day before Taxotere. Then again the day after chemo for 3 days. 30 tablet 1  . famotidine (PEPCID) 20 MG tablet Take 1 tablet (20 mg total) by mouth 2 (two) times daily. 30 tablet 0  . Ginkgo Biloba 120 MG CAPS Take by mouth.    . Ginseng 100 MG CAPS Take by mouth.    Marland Kitchen HYDROcodone-acetaminophen (NORCO/VICODIN) 5-325 MG per tablet Take 1 tablet by mouth every 6 (six) hours as needed for moderate pain or severe pain. (Patient not taking: Reported on 04/29/2015) 15 tablet 0  . lidocaine-prilocaine (EMLA) cream Apply to affected area once 30 g 3  . loratadine (CLARITIN) 10 MG tablet Take 1 tablet (10 mg total) by mouth daily. Daily for 3-5 days starting on neulasta injection day 30 tablet 2  . Lysine  1000 MG TABS Take by mouth.    . Multiple Vitamins-Minerals (CENTRUM SILVER PO) Take by mouth.    . Omega-3 Fatty Acids (OMEGA-3 FISH OIL PO) Take by mouth.    . ondansetron (ZOFRAN) 8 MG tablet Take 1 tablet (8 mg total) by mouth 2 (two) times daily. Start the day after chemo for 3 days. Then take as needed for nausea or vomiting. 30 tablet 1  . prochlorperazine (COMPAZINE) 10 MG tablet Take 1 tablet (10 mg total) by mouth every 6 (six) hours as needed (Nausea or vomiting). 30 tablet 1  . St Johns Wort 300 MG CAPS Take by mouth.    . sucralfate (CARAFATE) 1 GM/10ML suspension Take 10 mLs (1 g total) by mouth 4 (four) times daily -  with meals and at bedtime. 420 mL 1  . UNABLE TO FIND 200 mg. Gaba  plus    . UNABLE TO FIND 1 each by Other route once. Dispense cranial prosthesis per medical necessity due to alopecia secondary to chemotherapy for known breast cancer diagnosis. 1 each 1   No current facility-administered medications for this visit.    OBJECTIVE: middle-aged white woman  Filed Vitals:   05/14/15 1336  BP: 129/75  Pulse: 86  Temp: 98 F (36.7 C)  Resp: 18     Body mass index is 27.13 kg/(m^2).    ECOG FS:1 - Symptomatic but completely ambulatory  Sclerae unicteric, pupils round and equal Oropharynx clear and moist-- no thrush or other lesions No cervical or supraclavicular adenopathy Lungs no rales or rhonchi Heart regular rate and rhythm Abd soft, nontender, positive bowel sounds MSK no focal spinal tenderness, no upper extremity lymphedema Neuro: nonfocal, well oriented, appropriate affect Breasts: The right breast is unremarkable. The left breast mass is easily palpable in the lateral aspect of the breast, measuring approximately 2 cm. There is no skin or nipple involvement. The left axilla is benign.  Photo of facial rash 05/14/2015     LAB RESULTS:  CMP     Component Value Date/Time   NA 138 05/12/2015 1241   K 4.1 05/12/2015 1241   CO2 24 05/12/2015 1241    GLUCOSE 106 05/12/2015 1241   BUN 9.4 05/12/2015 1241   CREATININE 0.8 05/12/2015 1241   CALCIUM 9.3 05/12/2015 1241   PROT 7.0 05/12/2015 1241   ALBUMIN 3.6 05/12/2015 1241   AST 17 05/12/2015 1241   ALT 25 05/12/2015 1241   ALKPHOS 92 05/12/2015 1241   BILITOT 0.41 05/12/2015 1241    INo results found for: SPEP, UPEP  Lab Results  Component Value Date   WBC 19.3* 05/14/2015   NEUTROABS 12.9* 05/14/2015   HGB 13.6 05/14/2015   HCT 39.8 05/14/2015   MCV 96.6 05/14/2015   PLT 194 05/14/2015      Chemistry      Component Value Date/Time   NA 138 05/12/2015 1241   K 4.1 05/12/2015 1241   CO2 24 05/12/2015 1241   BUN 9.4 05/12/2015 1241   CREATININE 0.8 05/12/2015 1241      Component Value Date/Time   CALCIUM 9.3 05/12/2015 1241   ALKPHOS 92 05/12/2015 1241   AST 17 05/12/2015 1241   ALT 25 05/12/2015 1241   BILITOT 0.41 05/12/2015 1241       No results found for: LABCA2  No components found for: LABCA125  No results for input(s): INR in the last 168 hours.  Urinalysis No results found for: COLORURINE, APPEARANCEUR, LABSPEC, PHURINE, GLUCOSEU, HGBUR, BILIRUBINUR, KETONESUR, PROTEINUR, UROBILINOGEN, NITRITE, LEUKOCYTESUR  STUDIES: Mr Breast Bilateral W Wo Contrast  04/28/2015   CLINICAL DATA:  Patient with recent diagnosis left breast invasive ductal carcinoma and ductal carcinoma in situ.  EXAM: BILATERAL BREAST MRI WITH AND WITHOUT CONTRAST  TECHNIQUE: Multiplanar, multisequence MR images of both breasts were obtained prior to and following the intravenous administration of 16 ml of MultiHance.  THREE-DIMENSIONAL MR IMAGE RENDERING ON INDEPENDENT WORKSTATION:  Three-dimensional MR images were rendered by post-processing of the original MR data on an independent workstation. The three-dimensional MR images were interpreted, and findings are reported in the following complete MRI report for this study. Three dimensional images were evaluated at the independent  DynaCad workstation  COMPARISON:  Previous exam(s).  FINDINGS: Breast composition: c.  Heterogeneous fibroglandular tissue.  Background parenchymal enhancement: Moderate.  Right breast: Multiple T2 bright cysts are demonstrated throughout the  right breast. Many of the cysts demonstrate a peripheral rim of enhancement. No suspicious enhancing masses identified within the right breast.  Left breast: Within upper-outer left breast posterior depth there is a 1.7 x 1.7 x 1.8 cm irregular enhancing mass compatible with recently biopsied left breast invasive ductal carcinoma. Central susceptibility artifact compatible with biopsy marking clip. No additional suspicious enhancing masses are identified within the left breast. Multiple T2 bright cysts are demonstrated throughout the left breast. Many of the cysts demonstrate a peripheral rim enhancement.  Lymph nodes: No abnormal appearing lymph nodes.  Ancillary findings:  None.  IMPRESSION: Biopsy-proven left breast carcinoma upper-outer quadrant posterior depth.  No additional suspicious areas of enhancement within either breast.  RECOMMENDATION: Treatment plan.  BI-RADS CATEGORY  6: Known biopsy-proven malignancy.   Electronically Signed   By: Lovey Newcomer M.D.   On: 04/28/2015 15:05   Dg Chest Port 1 View  04/27/2015   CLINICAL DATA:  49 year old female status post porta cath placement. Initial encounter. Breast cancer.  EXAM: PORTABLE CHEST - 1 VIEW  COMPARISON:  None.  FINDINGS: Portable AP semi upright view at 1151 hours. Right chest IJ approach porta cath in place. Catheter tip projects just below the carina at the level of the SVC. No pneumothorax. Normal cardiac size and mediastinal contours. Visualized tracheal air column is within normal limits. Mild perihilar and retrocardiac opacity on the left most resembles atelectasis.  IMPRESSION: 1. Right chest IJ approach porta cath placed with no adverse features. Catheter tip at the SVC level. 2. Left lung atelectasis  suspected.   Electronically Signed   By: Genevie Ann M.D.   On: 04/27/2015 11:38   Dg Fluoro Guide Cv Line-no Report  04/27/2015   CLINICAL DATA:    FLOURO GUIDE CV LINE  Fluoroscopy was utilized by the requesting physician.  No radiographic  interpretation.     ASSESSMENT: 49 y.o. [place] woman status post left breast biopsy 04/14/2015 for a clinical T2 N0, stage IIA invasive ductal carcinoma, grade 2, estrogen and progesterone receptor positive, with HER-2 amplified, and an MIB-1 of 20%  (1). Neoadjuvant chemotherapy consisting of carboplatin, docetaxel, trastuzumab and pertuzumab every 21 days 6, with onpro support, satrted 05/06/2015  (a) pertuzumab held cycle 2 due to rash  (2) trastuzumab to be continued to total one year  (a) most recent echo 04/30/2015 shows EF 55-60%  (3) surgery to follow chemotherapy  (4) radiation to follow surgery  (5) anti-estrogens to follow radiation  (6) genetics testing pending  PLAN: Genise 's rash is limited pretty much to areas where she has been getting some sun. This is similar to the rash we see with EGFR inhibitors. I don't think it is a drug allergy as such although it is likely provoked by one of her medications.  I have asked her to stop Norco, because that certainly can cause a rash although I don't think this is a Norco rash. More likely this is going to be related to one of the antibodies she is receiving. Specifically I am going to omit pertuzumab from cycle 2. I am also adding minocycline 100 mg daily to control the rash. She should be careful to use sunscreens and stay out of the sun for the next several months..   A second drug we are omitting is ondansetron. This is famous for headaches. She has a history of prior headaches, so it might be unrelated, but in any case I think with dexamethasone and prochlorperazine she will  maintain adequate control of her nausea without Zofran, which could make the headache worse.  I am adding Restasis  instead of the Tobradex to her medications. I think this will take care of the dry eye problems.  Hopefully she will do better with the second cycle! She is already scheduled for that as well as appropriate follow-up. She will call with any other problems that may develop before her next visit here.    Chauncey Cruel, MD   05/14/2015 1:58 PM

## 2015-05-15 DIAGNOSIS — L270 Generalized skin eruption due to drugs and medicaments taken internally: Secondary | ICD-10-CM | POA: Insufficient documentation

## 2015-05-27 ENCOUNTER — Ambulatory Visit (HOSPITAL_BASED_OUTPATIENT_CLINIC_OR_DEPARTMENT_OTHER): Payer: BLUE CROSS/BLUE SHIELD

## 2015-05-27 ENCOUNTER — Encounter: Payer: Self-pay | Admitting: General Practice

## 2015-05-27 ENCOUNTER — Other Ambulatory Visit: Payer: Self-pay | Admitting: Oncology

## 2015-05-27 ENCOUNTER — Encounter: Payer: Self-pay | Admitting: Nurse Practitioner

## 2015-05-27 ENCOUNTER — Other Ambulatory Visit (HOSPITAL_BASED_OUTPATIENT_CLINIC_OR_DEPARTMENT_OTHER): Payer: BLUE CROSS/BLUE SHIELD

## 2015-05-27 ENCOUNTER — Ambulatory Visit (HOSPITAL_BASED_OUTPATIENT_CLINIC_OR_DEPARTMENT_OTHER): Payer: BLUE CROSS/BLUE SHIELD | Admitting: Nurse Practitioner

## 2015-05-27 VITALS — BP 152/77 | HR 94 | Temp 98.2°F | Resp 18 | Ht 68.0 in | Wt 178.6 lb

## 2015-05-27 DIAGNOSIS — C50412 Malignant neoplasm of upper-outer quadrant of left female breast: Secondary | ICD-10-CM

## 2015-05-27 DIAGNOSIS — R21 Rash and other nonspecific skin eruption: Secondary | ICD-10-CM

## 2015-05-27 DIAGNOSIS — Z5112 Encounter for antineoplastic immunotherapy: Secondary | ICD-10-CM

## 2015-05-27 DIAGNOSIS — Z5111 Encounter for antineoplastic chemotherapy: Secondary | ICD-10-CM

## 2015-05-27 DIAGNOSIS — Z5189 Encounter for other specified aftercare: Secondary | ICD-10-CM

## 2015-05-27 DIAGNOSIS — G43009 Migraine without aura, not intractable, without status migrainosus: Secondary | ICD-10-CM

## 2015-05-27 LAB — COMPREHENSIVE METABOLIC PANEL (CC13)
ALBUMIN: 3.6 g/dL (ref 3.5–5.0)
ALT: 20 U/L (ref 0–55)
AST: 16 U/L (ref 5–34)
Alkaline Phosphatase: 74 U/L (ref 40–150)
Anion Gap: 8 mEq/L (ref 3–11)
BUN: 13.6 mg/dL (ref 7.0–26.0)
CHLORIDE: 107 meq/L (ref 98–109)
CO2: 25 meq/L (ref 22–29)
Calcium: 9 mg/dL (ref 8.4–10.4)
Creatinine: 0.7 mg/dL (ref 0.6–1.1)
Glucose: 91 mg/dl (ref 70–140)
Potassium: 3.8 mEq/L (ref 3.5–5.1)
SODIUM: 140 meq/L (ref 136–145)
Total Bilirubin: 0.62 mg/dL (ref 0.20–1.20)
Total Protein: 6.6 g/dL (ref 6.4–8.3)

## 2015-05-27 LAB — CBC WITH DIFFERENTIAL/PLATELET
BASO%: 0.4 % (ref 0.0–2.0)
Basophils Absolute: 0 10*3/uL (ref 0.0–0.1)
EOS%: 0 % (ref 0.0–7.0)
Eosinophils Absolute: 0 10*3/uL (ref 0.0–0.5)
HEMATOCRIT: 37.3 % (ref 34.8–46.6)
HGB: 12.6 g/dL (ref 11.6–15.9)
LYMPH%: 23.4 % (ref 14.0–49.7)
MCH: 33 pg (ref 25.1–34.0)
MCHC: 33.8 g/dL (ref 31.5–36.0)
MCV: 97.8 fL (ref 79.5–101.0)
MONO#: 1 10*3/uL — AB (ref 0.1–0.9)
MONO%: 9.2 % (ref 0.0–14.0)
NEUT#: 7.5 10*3/uL — ABNORMAL HIGH (ref 1.5–6.5)
NEUT%: 67 % (ref 38.4–76.8)
PLATELETS: 220 10*3/uL (ref 145–400)
RBC: 3.81 10*6/uL (ref 3.70–5.45)
RDW: 12.5 % (ref 11.2–14.5)
WBC: 11.2 10*3/uL — ABNORMAL HIGH (ref 3.9–10.3)
lymph#: 2.6 10*3/uL (ref 0.9–3.3)

## 2015-05-27 MED ORDER — ACETAMINOPHEN 325 MG PO TABS
650.0000 mg | ORAL_TABLET | Freq: Once | ORAL | Status: AC
  Administered 2015-05-27: 650 mg via ORAL

## 2015-05-27 MED ORDER — TRASTUZUMAB CHEMO INJECTION 440 MG
6.0000 mg/kg | Freq: Once | INTRAVENOUS | Status: AC
  Administered 2015-05-27: 483 mg via INTRAVENOUS
  Filled 2015-05-27: qty 23

## 2015-05-27 MED ORDER — SODIUM CHLORIDE 0.9 % IV SOLN
Freq: Once | INTRAVENOUS | Status: AC
  Administered 2015-05-27: 11:00:00 via INTRAVENOUS
  Filled 2015-05-27: qty 8

## 2015-05-27 MED ORDER — DOCETAXEL CHEMO INJECTION 160 MG/16ML
75.0000 mg/m2 | Freq: Once | INTRAVENOUS | Status: AC
  Administered 2015-05-27: 150 mg via INTRAVENOUS
  Filled 2015-05-27: qty 15

## 2015-05-27 MED ORDER — PEGFILGRASTIM 6 MG/0.6ML ~~LOC~~ PSKT
6.0000 mg | PREFILLED_SYRINGE | Freq: Once | SUBCUTANEOUS | Status: AC
  Administered 2015-05-27: 6 mg via SUBCUTANEOUS
  Filled 2015-05-27: qty 0.6

## 2015-05-27 MED ORDER — ACETAMINOPHEN 325 MG PO TABS
ORAL_TABLET | ORAL | Status: AC
  Filled 2015-05-27: qty 2

## 2015-05-27 MED ORDER — DIPHENHYDRAMINE HCL 25 MG PO CAPS
ORAL_CAPSULE | ORAL | Status: AC
  Filled 2015-05-27: qty 1

## 2015-05-27 MED ORDER — SODIUM CHLORIDE 0.9 % IV SOLN
Freq: Once | INTRAVENOUS | Status: AC
  Administered 2015-05-27: 10:00:00 via INTRAVENOUS

## 2015-05-27 MED ORDER — SODIUM CHLORIDE 0.9 % IJ SOLN
10.0000 mL | INTRAMUSCULAR | Status: DC | PRN
  Administered 2015-05-27: 10 mL
  Filled 2015-05-27: qty 10

## 2015-05-27 MED ORDER — DIPHENHYDRAMINE HCL 25 MG PO CAPS
25.0000 mg | ORAL_CAPSULE | Freq: Once | ORAL | Status: AC
  Administered 2015-05-27: 25 mg via ORAL

## 2015-05-27 MED ORDER — SODIUM CHLORIDE 0.9 % IV SOLN
600.0000 mg | Freq: Once | INTRAVENOUS | Status: AC
  Administered 2015-05-27: 600 mg via INTRAVENOUS
  Filled 2015-05-27: qty 60

## 2015-05-27 MED ORDER — HEPARIN SOD (PORK) LOCK FLUSH 100 UNIT/ML IV SOLN
500.0000 [IU] | Freq: Once | INTRAVENOUS | Status: AC | PRN
  Administered 2015-05-27: 500 [IU]
  Filled 2015-05-27: qty 5

## 2015-05-27 NOTE — Progress Notes (Signed)
Spiritual Care Note  Spoke briefly with Cynthia [name], meeting her mom and youngest daughter, in the lobby today.  She was in good spirits and eager for chaplain connection.  The buzzer interrupted our time, and she completed chemo before I was able to follow up in detail.  Left her a voicemail of support and encouragement, and plan to follow up next week.  Please also page as needs arise. Thank you.  [place], [place]

## 2015-05-27 NOTE — Progress Notes (Signed)
[facility]  [phone]) [phone] Fax:(336) [phone]     ID: Cynthia [name] DOB: [birth year]  MR#: [id number]  EXN#:[id number]  Patient Care Team: No Pcp Per Patient as PCP - General (General Practice) Rolm Bookbinder, MD as Consulting Physician (General Surgery) Chauncey Cruel, MD as Consulting Physician (Oncology) Kyung Rudd, MD as Consulting Physician (Radiation Oncology) Mauro Kaufmann, RN as Registered Nurse Rockwell Germany, RN as Registered Nurse PCP: No PCP Per Patient OTHER MD: Christene Slates M.D.  CHIEF COMPLAINT: Triple positive breast cancer  CURRENT TREATMENT: Neoadjuvant chemotherapy   BREAST CANCER HISTORY: From the original intake note:  Cynthia [name] had noted a change in her left breast on self-exam the last week in March. She brought it to Dr. Trellis Paganini attention and on 04/19/[name] underwent diagnostic bilateral mammography and left ultrasonography at [facility]. This found the breast composition to be category D. An area of architectural distortion was noted in the left breast upper outer quadrant correlating to the palpable mass. By ultrasound there was a 2 cm lobulated mass in the left breast upper outer quadrant which was hypoechoic. There was a 4 mm nodule immediately adjacent to this felt to be a satellite lesion.  Biopsy of the left breast mass in question 04/20/[name] showed ([id number]) invasive ductal carcinoma, grade 2, estrogen receptor 90% positive, progesterone receptor 90% positive, with an MIB-1 of 20%, and HER-2 amplified, the signals ratio being 5.36.  The patient's subsequent history is as detailed below  INTERVAL HISTORY: Cynthia [name] returns today for follow up of her breast cancer, accompanied by her mother. Today is day 1, cycle 2 of 6 cycles planned of carboplatin, docetaxel, trastuzumab and pertuzumab.  REVIEW OF SYSTEMS: Cynthia [name] is much improved since last week. The rash on her arms is better with aquaphor and minocycline. It has completely resolved  on her face. Her headaches have backed off. She denies fevers, chills, nausea, or vomiting. She took imodium daily and now is almost constipated but is still moving. She walks/runs 2 miles daily. Her appetite and energy levels are good. Her eyes are still dry. She stopped using the restasis because it burned. A detailed review of systems is otherwise stable.  PAST MEDICAL HISTORY: Past Medical History  Diagnosis Date  . Breast cancer of upper-outer quadrant of left female breast 4/22/[name]  . Anxiety   . Hot flashes    melanoma, removed in [place]   PAST SURGICAL HISTORY: Past Surgical History  Procedure Laterality Date  . Bladder suspension  2009  . Dnc    . Dilation and curettage of uterus    . Portacath placement Right 5/3/[name]    Procedure: INSERTION PORT-A-CATH;  Surgeon: Rolm Bookbinder, MD;  Location: [facility];  Service: General;  Laterality: Right;    FAMILY HISTORY Family History  Problem Relation Age of Onset  . Breast cancer Maternal Aunt   . Lung cancer Maternal Grandmother   . Lung cancer Paternal Grandfather    the patient's parents are still living as of April [name]. They are divorced. The patient is an only child. The patient's paternal grandfather and maternal grandmother had a history of lung cancer. On the mother's side there is a history of melanoma. The patient's mother's only sister was diagnosed with breast cancer in her 30s. There is no history of ovarian cancer in the family.   GYNECOLOGIC HISTORY:  No LMP recorded.  menarche age 47, first live birth age 32, the patient is GX P3. Her periods are  ongoing but irregular. She has a Mirena in place for contraception. She took oral contraceptives for approximately 15 years in the past with no complications.   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 She is single, and lives at home with her father, who is disabled and has a history of depression. At home also  is [name] "Cynthia [name], Cynthia [name]. The patient's 2 other daughters are Cynthia [name], lives in [place] and is a physical therapy technician, and Cynthia [name], lives in [place] [facility] [facility] and works in Press photographer.     ADVANCED DIRECTIVES:  not in place.    HEALTH MAINTENANCE: History  Substance Use Topics  . Smoking status: Former Smoker    Types: Cigarettes    Quit date: 04/22/2009  . Smokeless tobacco: Not on file  . Alcohol Use: Yes     Comment: occasion     Colonoscopy: never   PAP:  Bone density: never   Lipid panel:  Allergies  Allergen Reactions  . Zofran [Ondansetron Hcl] Other (See Comments)    headache  . Adhesive [Tape] Rash    Steri strip    Current Outpatient Prescriptions  Medication Sig Dispense Refill  . ALPRAZolam (XANAX) 1 MG tablet     . dexamethasone (DECADRON) 4 MG tablet Take 2 tablets (8 mg total) by mouth 2 (two) times daily. Start the day before Taxotere. Then again the day after chemo for 3 days. 30 tablet 1  . famotidine (PEPCID) 20 MG tablet Take 1 tablet (20 mg total) by mouth 2 (two) times daily. 30 tablet 0  . lidocaine-prilocaine (EMLA) cream Apply to affected area once 30 g 3  . loratadine (CLARITIN) 10 MG tablet Take 1 tablet (10 mg total) by mouth daily. Daily for 3-5 days starting on neulasta injection day 30 tablet 2  . minocycline (MINOCIN) 100 MG capsule Take 1 capsule (100 mg total) by mouth 2 (two) times daily. 100 capsule 3  . Multiple Vitamins-Minerals (CENTRUM SILVER PO) Take by mouth.    . prochlorperazine (COMPAZINE) 10 MG tablet Take 1 tablet (10 mg total) by mouth every 6 (six) hours as needed (Nausea or vomiting). 30 tablet 1  . cycloSPORINE (RESTASIS) 0.05 % ophthalmic emulsion Place 1 drop into both eyes 2 (two) times daily. (Patient not taking: Reported on 6/2/[name]) 30 mL 3   No current facility-administered medications for this visit.   Facility-Administered Medications Ordered in Other Visits    Medication Dose Route Frequency Provider Last Rate Last Dose  . CARBOplatin (PARAPLATIN) 600 mg in sodium chloride 0.9 % 250 mL chemo infusion  600 mg Intravenous Once Chauncey Cruel, MD      . DOCEtaxel (TAXOTERE) 150 mg in dextrose 5 % 250 mL chemo infusion  75 mg/m2 (Treatment Plan Actual) Intravenous Once Chauncey Cruel, MD      . heparin lock flush 100 unit/mL  500 Units Intracatheter Once PRN Chauncey Cruel, MD      . pegfilgrastim (NEULASTA ONPRO KIT) injection 6 mg  6 mg Subcutaneous Once Chauncey Cruel, MD      . sodium chloride 0.9 % injection 10 mL  10 mL Intracatheter PRN Chauncey Cruel, MD      . trastuzumab (HERCEPTIN) 483 mg in sodium chloride 0.9 % 250 mL chemo infusion  6 mg/kg (Treatment Plan Actual) Intravenous Once Chauncey Cruel, MD        OBJECTIVE: middle-aged white  woman  Filed Vitals:   05/27/15 0907  BP: 152/77  Pulse: 94  Temp: 98.2 F (36.8 C)  Resp: 18     Body mass index is 27.16 kg/(m^2).    ECOG FS:1 - Symptomatic but completely ambulatory  Skin: warm, dry, erythematous papular rash to bilateral arms and chest, improving, none on face HEENT: sclerae anicteric, conjunctivae pink, oropharynx clear. No thrush or mucositis.  Lymph Nodes: No cervical or supraclavicular lymphadenopathy  Lungs: clear to auscultation bilaterally, no rales, wheezes, or rhonci  Heart: regular rate and rhythm  Abdomen: round, soft, non tender, positive bowel sounds  Musculoskeletal: No focal spinal tenderness, no peripheral edema  Neuro: non focal, well oriented, positive affect  Breasts: deferred  LAB RESULTS:  CMP     Component Value Date/Time   NA 140 06/02/[name] 0856   K 3.8 06/02/[name] 0856   CO2 25 06/02/[name] 0856   GLUCOSE 91 06/02/[name] 0856   BUN 13.6 06/02/[name] 0856   CREATININE 0.7 06/02/[name] 0856   CALCIUM 9.0 06/02/[name] 0856   PROT 6.6 06/02/[name] 0856   ALBUMIN 3.6 06/02/[name] 0856   AST 16 06/02/[name] 0856   ALT 20 06/02/[name] 0856    ALKPHOS 74 06/02/[name] 0856   BILITOT 0.Cynthia 06/02/[name] 0856    INo results found for: SPEP, UPEP  Lab Results  Component Value Date   WBC 11.2* 06/02/[name]   NEUTROABS 7.5* 06/02/[name]   HGB 12.6 06/02/[name]   HCT 37.3 06/02/[name]   MCV 97.8 06/02/[name]   PLT 220 06/02/[name]      Chemistry      Component Value Date/Time   NA 140 06/02/[name] 0856   K 3.8 06/02/[name] 0856   CO2 25 06/02/[name] 0856   BUN 13.6 06/02/[name] 0856   CREATININE 0.7 06/02/[name] 0856      Component Value Date/Time   CALCIUM 9.0 06/02/[name] 0856   ALKPHOS 74 06/02/[name] 0856   AST 16 06/02/[name] 0856   ALT 20 06/02/[name] 0856   BILITOT 0.Cynthia 06/02/[name] 0856       No results found for: LABCA2  No components found for: LABCA125  No results for input(s): INR in the last 168 hours.  Urinalysis No results found for: COLORURINE, APPEARANCEUR, LABSPEC, PHURINE, GLUCOSEU, HGBUR, BILIRUBINUR, KETONESUR, PROTEINUR, UROBILINOGEN, NITRITE, LEUKOCYTESUR  STUDIES: Dg Chest Port 1 View  5/3/[name]   CLINICAL DATA:  49 year old female status post porta cath placement. Initial encounter. Breast cancer.  EXAM: PORTABLE CHEST - 1 VIEW  COMPARISON:  None.  FINDINGS: Portable AP semi upright view at 1151 hours. Right chest IJ approach porta cath in place. Catheter tip projects just below the carina at the level of the SVC. No pneumothorax. Normal cardiac size and mediastinal contours. Visualized tracheal air column is within normal limits. Mild perihilar and retrocardiac opacity on the left most resembles atelectasis.  IMPRESSION: 1. Right chest IJ approach porta cath placed with no adverse features. Catheter tip at the SVC level. 2. Left lung atelectasis suspected.   Electronically Signed   By: Genevie Ann M.D.   On: 05/03/[name] 11:38    ASSESSMENT: 49 y.o. [place] woman status post left breast biopsy 04/20/[name] for a clinical T2 N0, stage IIA invasive ductal carcinoma, grade 2, estrogen and progesterone receptor positive, with HER-2  amplified, and an MIB-1 of 20%  (1). Neoadjuvant chemotherapy consisting of carboplatin, docetaxel, trastuzumab and pertuzumab every 21 days 6, with onpro support, satrted 05/12/[name]  (a) pertuzumab held cycle 2 due to rash  (2) trastuzumab to be continued  to total one year  (a) most recent echo 05/06/[name] shows EF 55-60%  (3) surgery to follow chemotherapy  (4) radiation to follow surgery  (5) anti-estrogens to follow radiation  (6) genetics testing pending  PLAN: Kevin is doing well today. The labs were reviewed in detail and were entirely stable. She will proceed with cycle 2 of carboplatin, docetaxel, and trastuzumab as planned today.   She will continue the minocycline for her rash. She is choosing to continue with the onpro neulasta body injector even though she may be mildly sensitive to the adhesive.  She is to use dexamethasone and compazine alone for her mild nausea.   Sofya will return in 1 week for labs and a nadir visit. She understands and agrees with this plan. She knows the goal of treatment in her case is cure. She has been encouraged to call with any issues that might arise before her next visit here.   Laurie Panda, NP   6/2/[name] 10:58 AM

## 2015-05-27 NOTE — Patient Instructions (Signed)
[facility] Discharge Instructions for Patients Receiving Chemotherapy  Today you received the following chemotherapy agents taxotere/carboplatin/herceptin   To help prevent nausea and vomiting after your treatment, we encourage you to take your nausea medication as directed   If you develop nausea and vomiting that is not controlled by your nausea medication, call the clinic.   BELOW ARE SYMPTOMS THAT SHOULD BE REPORTED IMMEDIATELY:  *FEVER GREATER THAN 100.5 F  *CHILLS WITH OR WITHOUT FEVER  NAUSEA AND VOMITING THAT IS NOT CONTROLLED WITH YOUR NAUSEA MEDICATION  *UNUSUAL SHORTNESS OF BREATH  *UNUSUAL BRUISING OR BLEEDING  TENDERNESS IN MOUTH AND THROAT WITH OR WITHOUT PRESENCE OF ULCERS  *URINARY PROBLEMS  *BOWEL PROBLEMS  UNUSUAL RASH Items with * indicate a potential emergency and should be followed up as soon as possible.  Feel free to call the clinic you have any questions or concerns. The clinic phone number is [phone].

## 2015-05-28 ENCOUNTER — Telehealth: Payer: Self-pay | Admitting: Oncology

## 2015-05-28 NOTE — Telephone Encounter (Signed)
Returned patient call re moving 6/10 appointments to 6/9 due to work schedule (meeting). No availability 6/9, however [facility] does have availability 6/8. Spoke with desk and ok for patient to see [facility] 6/8. Spoke with patient re appointment for 6/8 lab/KC. Patient ok with date/time/KC.

## 2015-06-02 ENCOUNTER — Ambulatory Visit (HOSPITAL_BASED_OUTPATIENT_CLINIC_OR_DEPARTMENT_OTHER): Payer: BLUE CROSS/BLUE SHIELD | Admitting: Oncology

## 2015-06-02 ENCOUNTER — Encounter: Payer: Self-pay | Admitting: Oncology

## 2015-06-02 ENCOUNTER — Other Ambulatory Visit (HOSPITAL_BASED_OUTPATIENT_CLINIC_OR_DEPARTMENT_OTHER): Payer: BLUE CROSS/BLUE SHIELD

## 2015-06-02 VITALS — BP 108/57 | HR 93 | Temp 98.2°F | Resp 20 | Ht 68.0 in | Wt 177.5 lb

## 2015-06-02 DIAGNOSIS — C50412 Malignant neoplasm of upper-outer quadrant of left female breast: Secondary | ICD-10-CM

## 2015-06-02 DIAGNOSIS — R21 Rash and other nonspecific skin eruption: Secondary | ICD-10-CM | POA: Diagnosis not present

## 2015-06-02 DIAGNOSIS — G43009 Migraine without aura, not intractable, without status migrainosus: Secondary | ICD-10-CM

## 2015-06-02 LAB — COMPREHENSIVE METABOLIC PANEL (CC13)
ALT: 27 U/L (ref 0–55)
AST: 20 U/L (ref 5–34)
Albumin: 3.3 g/dL — ABNORMAL LOW (ref 3.5–5.0)
Alkaline Phosphatase: 101 U/L (ref 40–150)
Anion Gap: 5 mEq/L (ref 3–11)
BILIRUBIN TOTAL: 0.35 mg/dL (ref 0.20–1.20)
BUN: 15.4 mg/dL (ref 7.0–26.0)
CALCIUM: 8.8 mg/dL (ref 8.4–10.4)
CO2: 28 mEq/L (ref 22–29)
Chloride: 103 mEq/L (ref 98–109)
Creatinine: 0.7 mg/dL (ref 0.6–1.1)
EGFR: >90 mL/min/{1.73_m2} (ref >90)
GLUCOSE: 94 mg/dL (ref 70–140)
POTASSIUM: 4.4 meq/L (ref 3.5–5.1)
SODIUM: 136 meq/L (ref 136–145)
Total Protein: 6.1 g/dL — ABNORMAL LOW (ref 6.4–8.3)

## 2015-06-02 LAB — CBC WITH DIFFERENTIAL/PLATELET
BASO%: 0.4 % (ref 0.0–2.0)
Basophils Absolute: 0 10*3/uL (ref 0.0–0.1)
EOS ABS: 0.2 10*3/uL (ref 0.0–0.5)
EOS%: 2.2 % (ref 0.0–7.0)
HEMATOCRIT: 35.7 % (ref 34.8–46.6)
HGB: 12.1 g/dL (ref 11.6–15.9)
LYMPH#: 2.7 10*3/uL (ref 0.9–3.3)
LYMPH%: 27.7 % (ref 14.0–49.7)
MCH: 33.2 pg (ref 25.1–34.0)
MCHC: 33.9 g/dL (ref 31.5–36.0)
MCV: 98.1 fL (ref 79.5–101.0)
MONO#: 1.1 10*3/uL — ABNORMAL HIGH (ref 0.1–0.9)
MONO%: 11.3 % (ref 0.0–14.0)
NEUT%: 58.4 % (ref 38.4–76.8)
NEUTROS ABS: 5.6 10*3/uL (ref 1.5–6.5)
Platelets: 104 10*3/uL — ABNORMAL LOW (ref 145–400)
RBC: 3.64 10*6/uL — ABNORMAL LOW (ref 3.70–5.45)
RDW: 12.8 % (ref 11.2–14.5)
WBC: 9.6 10*3/uL (ref 3.9–10.3)

## 2015-06-02 NOTE — Progress Notes (Signed)
[facility]  [phone] [phone]     ID: Cynthia [name] DOB: 09/29/1966  MR#: [id number]  CSN#:[id number]  Patient Care Team: No Pcp Per Patient as PCP - General (General Practice) Matthew Wakefield, MD as Consulting Physician (General Surgery) Gustav C Magrinat, MD as Consulting Physician (Oncology) John Moody, MD as Consulting Physician (Radiation Oncology) Dawn C Stuart, RN as Registered Nurse Keisha N Martini, RN as Registered Nurse PCP: No PCP Per Patient OTHER MD: Rick Cornella M.D.  CHIEF COMPLAINT: Triple positive breast cancer  CURRENT TREATMENT: Neoadjuvant chemotherapy   BREAST CANCER HISTORY: From the original intake note:  [name] had noted a change in her left breast on self-exam the last week in March. She brought it to Dr. Modi's attention and on 04/13/2015 underwent diagnostic bilateral mammography and left ultrasonography at [facility]. This found the breast composition to be category D. An area of architectural distortion was noted in the left breast upper outer quadrant correlating to the palpable mass. By ultrasound there was a 2 cm lobulated mass in the left breast upper outer quadrant which was hypoechoic. There was a 4 mm nodule immediately adjacent to this felt to be a satellite lesion.  Biopsy of the left breast mass in question 04/14/2015 showed ([id number]) invasive ductal carcinoma, grade 2, estrogen receptor 90% positive, progesterone receptor 90% positive, with an MIB-1 of 20%, and HER-2 amplified, the signals ratio being 5.36.  The patient's subsequent history is as detailed below  INTERVAL HISTORY: Cynthia [name] returns today for follow up of her breast cancer. Today is day 8, cycle 2 of 6 cycles planned of carboplatin, docetaxel, trastuzumab and pertuzumab.  REVIEW OF SYSTEMS: Cynthia [name] is doing well. The rash on her arms is better with aquaphor and minocycline. It has completely resolved on her face. Her headaches have backed off.  She denies fevers, chills, nausea, or vomiting. Had diarrhea yesterday and took Imodium which was effective. She walks/runs 2 miles daily. Her appetite and energy levels are good. Her eyes are still dry. A detailed review of systems is otherwise stable.  PAST MEDICAL HISTORY: Past Medical History  Diagnosis Date  . 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 Anxiety   . Hot flashes    melanoma, removed in [place] 2012   PAST SURGICAL HISTORY: Past Surgical History  Procedure Laterality Date  . Bladder suspension  2009  . Dnc    . Dilation and curettage of uterus    . Portacath placement Right 04/27/2015    Procedure: INSERTION PORT-A-CATH;  Surgeon: Matthew Wakefield, MD;  Location: [facility];  Service: General;  Laterality: Right;    FAMILY HISTORY Family History  Problem Relation Age of Onset  . Breast cancer Maternal Aunt   . Lung cancer Maternal Grandmother   . Lung cancer Paternal Grandfather    the patient's parents are still living as of April 2016. They are divorced. The patient is an only child. The patient's paternal grandfather and maternal grandmother had a history of lung cancer. On the mother's side there is a history of melanoma. The patient's mother's only sister was diagnosed with breast cancer in her 30s. There is no history of ovarian cancer in the family.   GYNECOLOGIC HISTORY:  No LMP recorded.  menarche age 11, first live birth age 24, the patient is GX P3. Her periods are ongoing but irregular. She has a Mirena in place for contraception. She took oral contraceptives for approximately 15 years   in the past with no complications.   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 She is single, and lives at home with her father, who is disabled and has a history of depression. At home also is [name] "[name], 50 years old as of April 2016. The patient's 2 other daughters are  Pearletha [name], lives in [place] and is a physical therapy technician, and Cynthia [name], lives in [place] and works in Press photographer.     ADVANCED DIRECTIVES:  not in place.    HEALTH MAINTENANCE: History  Substance Use Topics  . Smoking status: Former Smoker    Types: Cigarettes    Quit date: 04/22/2009  . Smokeless tobacco: Not on file  . Alcohol Use: Yes     Comment: occasion     Colonoscopy: never   PAP:  Bone density: never   Lipid panel:  Allergies  Allergen Reactions  . Zofran [Ondansetron Hcl] Other (See Comments)    headache  . Adhesive [Tape] Rash    Steri strip    Current Outpatient Prescriptions  Medication Sig Dispense Refill  . ALPRAZolam (XANAX) 1 MG tablet     . cycloSPORINE (RESTASIS) 0.05 % ophthalmic emulsion Place 1 drop into both eyes 2 (two) times daily. 30 mL 3  . dexamethasone (DECADRON) 4 MG tablet Take 2 tablets (8 mg total) by mouth 2 (two) times daily. Start the day before Taxotere. Then again the day after chemo for 3 days. 30 tablet 1  . famotidine (PEPCID) 20 MG tablet Take 1 tablet (20 mg total) by mouth 2 (two) times daily. 30 tablet 0  . lidocaine-prilocaine (EMLA) cream Apply to affected area once 30 g 3  . loratadine (CLARITIN) 10 MG tablet Take 1 tablet (10 mg total) by mouth daily. Daily for 3-5 days starting on neulasta injection day 30 tablet 2  . minocycline (MINOCIN) 100 MG capsule Take 1 capsule (100 mg total) by mouth 2 (two) times daily. 100 capsule 3  . Multiple Vitamins-Minerals (CENTRUM SILVER PO) Take by mouth.    . prochlorperazine (COMPAZINE) 10 MG tablet Take 1 tablet (10 mg total) by mouth every 6 (six) hours as needed (Nausea or vomiting). 30 tablet 1   No current facility-administered medications for this visit.    OBJECTIVE: middle-aged white woman  Filed Vitals:   06/02/15 1032  BP: 108/57  Pulse: 93  Temp: 98.2 F (36.8 C)  Resp: 20     Body mass index is 26.99 kg/(m^2).    ECOG FS:1 - Symptomatic but completely  ambulatory  Skin: warm, dry, erythematous papular rash to bilateral arms and chest, improving, none on face HEENT: sclerae anicteric, conjunctivae pink, oropharynx clear. No thrush or mucositis.  Lymph Nodes: No cervical or supraclavicular lymphadenopathy  Lungs: clear to auscultation bilaterally, no rales, wheezes, or rhonci  Heart: regular rate and rhythm  Abdomen: round, soft, non tender, positive bowel sounds  Musculoskeletal: No focal spinal tenderness, no peripheral edema  Neuro: non focal, well oriented, positive affect  Breasts: deferred  LAB RESULTS:  CMP     Component Value Date/Time   NA 136 06/02/2015 1016   K 4.4 06/02/2015 1016   CO2 28 06/02/2015 1016   GLUCOSE 94 06/02/2015 1016   BUN 15.4 06/02/2015 1016   CREATININE 0.7 06/02/2015 1016   CALCIUM 8.8 06/02/2015 1016   PROT 6.1* 06/02/2015 1016   ALBUMIN 3.3* 06/02/2015 1016   AST 20 06/02/2015 1016  ALT 27 06/02/2015 1016   ALKPHOS 101 06/02/2015 1016   BILITOT 0.35 06/02/2015 1016    INo results found for: SPEP, UPEP  Lab Results  Component Value Date   WBC 9.6 06/02/2015   NEUTROABS 5.6 06/02/2015   HGB 12.1 06/02/2015   HCT 35.7 06/02/2015   MCV 98.1 06/02/2015   PLT 104* 06/02/2015      Chemistry      Component Value Date/Time   NA 136 06/02/2015 1016   K 4.4 06/02/2015 1016   CO2 28 06/02/2015 1016   BUN 15.4 06/02/2015 1016   CREATININE 0.7 06/02/2015 1016      Component Value Date/Time   CALCIUM 8.8 06/02/2015 1016   ALKPHOS 101 06/02/2015 1016   AST 20 06/02/2015 1016   ALT 27 06/02/2015 1016   BILITOT 0.35 06/02/2015 1016       No results found for: LABCA2  No components found for: LABCA125  No results for input(s): INR in the last 168 hours.  Urinalysis No results found for: COLORURINE, APPEARANCEUR, LABSPEC, PHURINE, GLUCOSEU, HGBUR, BILIRUBINUR, KETONESUR, PROTEINUR, UROBILINOGEN, NITRITE, LEUKOCYTESUR  STUDIES: No results found.  ASSESSMENT: 48 y.o. [place]  woman status post left breast biopsy 04/14/2015 for a clinical T2 N0, stage IIA invasive ductal carcinoma, grade 2, estrogen and progesterone receptor positive, with HER-2 amplified, and an MIB-1 of 20%  (1). Neoadjuvant chemotherapy consisting of carboplatin, docetaxel, trastuzumab and pertuzumab every 21 days 6, with onpro support, satrted 05/06/2015  (a) pertuzumab held cycle 2 due to rash  (2) trastuzumab to be continued to total one year  (a) most recent echo 04/30/2015 shows EF 55-60%  (3) surgery to follow chemotherapy  (4) radiation to follow surgery  (5) anti-estrogens to follow radiation  (6) genetics testing pending  PLAN: Angelita is doing well today. The labs were reviewed in detail and were entirely stable.   She will continue the minocycline for her rash. She is choosing to continue with the onpro neulasta body injector even though she may be mildly sensitive to the adhesive.    Jessia will return in 2 weeks prior to cycle 3. She understands and agrees with this plan. She knows the goal of treatment in her case is cure. She has been encouraged to call with any issues that might arise before her next visit here.   CURCIO, KRISTIN, NP   06/02/2015 4:17 PM 

## 2015-06-04 ENCOUNTER — Ambulatory Visit: Payer: BLUE CROSS/BLUE SHIELD | Admitting: Nurse Practitioner

## 2015-06-04 ENCOUNTER — Other Ambulatory Visit: Payer: BLUE CROSS/BLUE SHIELD

## 2015-06-07 ENCOUNTER — Telehealth: Payer: Self-pay | Admitting: *Deleted

## 2015-06-07 NOTE — Telephone Encounter (Signed)
This RN called and discussed concerns related to exposure to shingles - per discussion pt understands contact precautions and hand washing post any contact.  Pt at present states rash ( ongoing ) still present but overall doing well " just going to keep on going and get thru this "  No other needs at this time.  Jackelin will call if other concerns occur.

## 2015-06-07 NOTE — Telephone Encounter (Signed)
PT. NOTICED HER FATHER'S BACK WAS "BROKE OUT" ON Thursday. HER FATHER WAS STARTED ON TWO TYPES OF SHINGLE MEDICATIONS ON Sunday AND IS KEEPING THE AREA COVER. PT. HAD CHICKEN POX WHEN SHE WAS A CHILD. WHAT PRECAUTIONS SHOULD SHE TAKE?

## 2015-06-17 ENCOUNTER — Other Ambulatory Visit: Payer: Self-pay | Admitting: Oncology

## 2015-06-17 ENCOUNTER — Other Ambulatory Visit (HOSPITAL_BASED_OUTPATIENT_CLINIC_OR_DEPARTMENT_OTHER): Payer: BLUE CROSS/BLUE SHIELD

## 2015-06-17 ENCOUNTER — Ambulatory Visit (HOSPITAL_BASED_OUTPATIENT_CLINIC_OR_DEPARTMENT_OTHER): Payer: BLUE CROSS/BLUE SHIELD

## 2015-06-17 ENCOUNTER — Ambulatory Visit (HOSPITAL_BASED_OUTPATIENT_CLINIC_OR_DEPARTMENT_OTHER): Payer: BLUE CROSS/BLUE SHIELD | Admitting: Nurse Practitioner

## 2015-06-17 ENCOUNTER — Encounter: Payer: Self-pay | Admitting: Nurse Practitioner

## 2015-06-17 VITALS — BP 143/84 | HR 109 | Temp 98.4°F | Resp 18 | Ht 68.0 in | Wt 181.8 lb

## 2015-06-17 DIAGNOSIS — G43009 Migraine without aura, not intractable, without status migrainosus: Secondary | ICD-10-CM

## 2015-06-17 DIAGNOSIS — Z5189 Encounter for other specified aftercare: Secondary | ICD-10-CM | POA: Diagnosis not present

## 2015-06-17 DIAGNOSIS — C50412 Malignant neoplasm of upper-outer quadrant of left female breast: Secondary | ICD-10-CM | POA: Diagnosis not present

## 2015-06-17 DIAGNOSIS — Z5111 Encounter for antineoplastic chemotherapy: Secondary | ICD-10-CM | POA: Diagnosis not present

## 2015-06-17 DIAGNOSIS — Z5112 Encounter for antineoplastic immunotherapy: Secondary | ICD-10-CM | POA: Diagnosis not present

## 2015-06-17 DIAGNOSIS — Z170 Estrogen receptor positive status [ER+]: Secondary | ICD-10-CM | POA: Diagnosis not present

## 2015-06-17 LAB — CBC WITH DIFFERENTIAL/PLATELET
BASO%: 0 % (ref 0.0–2.0)
Basophils Absolute: 0 10*3/uL (ref 0.0–0.1)
EOS%: 0 % (ref 0.0–7.0)
Eosinophils Absolute: 0 10*3/uL (ref 0.0–0.5)
HEMATOCRIT: 35.6 % (ref 34.8–46.6)
HGB: 12.1 g/dL (ref 11.6–15.9)
LYMPH%: 15.5 % (ref 14.0–49.7)
MCH: 34 pg (ref 25.1–34.0)
MCHC: 34 g/dL (ref 31.5–36.0)
MCV: 100 fL (ref 79.5–101.0)
MONO#: 0.4 10*3/uL (ref 0.1–0.9)
MONO%: 4.7 % (ref 0.0–14.0)
NEUT%: 79.8 % — ABNORMAL HIGH (ref 38.4–76.8)
NEUTROS ABS: 6.8 10*3/uL — AB (ref 1.5–6.5)
Platelets: 199 10*3/uL (ref 145–400)
RBC: 3.56 10*6/uL — ABNORMAL LOW (ref 3.70–5.45)
RDW: 14.4 % (ref 11.2–14.5)
WBC: 8.5 10*3/uL (ref 3.9–10.3)
lymph#: 1.3 10*3/uL (ref 0.9–3.3)

## 2015-06-17 LAB — COMPREHENSIVE METABOLIC PANEL (CC13)
ALT: 23 U/L (ref 0–55)
ANION GAP: 7 meq/L (ref 3–11)
AST: 16 U/L (ref 5–34)
Albumin: 3.4 g/dL — ABNORMAL LOW (ref 3.5–5.0)
Alkaline Phosphatase: 88 U/L (ref 40–150)
BILIRUBIN TOTAL: 0.37 mg/dL (ref 0.20–1.20)
BUN: 15.8 mg/dL (ref 7.0–26.0)
CALCIUM: 8.9 mg/dL (ref 8.4–10.4)
CO2: 25 meq/L (ref 22–29)
CREATININE: 0.7 mg/dL (ref 0.6–1.1)
Chloride: 109 mEq/L (ref 98–109)
EGFR: >90 mL/min/{1.73_m2} (ref >90)
Glucose: 136 mg/dl (ref 70–140)
Potassium: 4 mEq/L (ref 3.5–5.1)
Sodium: 142 mEq/L (ref 136–145)
Total Protein: 6.3 g/dL — ABNORMAL LOW (ref 6.4–8.3)

## 2015-06-17 MED ORDER — SODIUM CHLORIDE 0.9 % IV SOLN
Freq: Once | INTRAVENOUS | Status: AC
  Administered 2015-06-17: 12:00:00 via INTRAVENOUS
  Filled 2015-06-17: qty 8

## 2015-06-17 MED ORDER — SODIUM CHLORIDE 0.9 % IV SOLN
600.0000 mg | Freq: Once | INTRAVENOUS | Status: AC
  Administered 2015-06-17: 600 mg via INTRAVENOUS
  Filled 2015-06-17: qty 60

## 2015-06-17 MED ORDER — PEGFILGRASTIM 6 MG/0.6ML ~~LOC~~ PSKT
6.0000 mg | PREFILLED_SYRINGE | Freq: Once | SUBCUTANEOUS | Status: AC
  Administered 2015-06-17: 6 mg via SUBCUTANEOUS
  Filled 2015-06-17: qty 0.6

## 2015-06-17 MED ORDER — SODIUM CHLORIDE 0.9 % IV SOLN
Freq: Once | INTRAVENOUS | Status: AC
  Administered 2015-06-17: 11:00:00 via INTRAVENOUS

## 2015-06-17 MED ORDER — DOCETAXEL CHEMO INJECTION 160 MG/16ML
75.0000 mg/m2 | Freq: Once | INTRAVENOUS | Status: AC
  Administered 2015-06-17: 150 mg via INTRAVENOUS
  Filled 2015-06-17: qty 15

## 2015-06-17 MED ORDER — SODIUM CHLORIDE 0.9 % IV SOLN: Freq: Once | INTRAVENOUS | Status: DC

## 2015-06-17 MED ORDER — SODIUM CHLORIDE 0.9 % IV SOLN
6.0000 mg/kg | Freq: Once | INTRAVENOUS | Status: AC
  Administered 2015-06-17: 483 mg via INTRAVENOUS
  Filled 2015-06-17: qty 23

## 2015-06-17 MED ORDER — DIPHENHYDRAMINE HCL 25 MG PO CAPS
ORAL_CAPSULE | ORAL | Status: AC
  Filled 2015-06-17: qty 1

## 2015-06-17 MED ORDER — ACETAMINOPHEN 325 MG PO TABS
ORAL_TABLET | ORAL | Status: AC
  Filled 2015-06-17: qty 2

## 2015-06-17 MED ORDER — SODIUM CHLORIDE 0.9 % IJ SOLN
10.0000 mL | INTRAMUSCULAR | Status: DC | PRN
  Administered 2015-06-17: 10 mL
  Filled 2015-06-17: qty 10

## 2015-06-17 MED ORDER — HEPARIN SOD (PORK) LOCK FLUSH 100 UNIT/ML IV SOLN
500.0000 [IU] | Freq: Once | INTRAVENOUS | Status: AC | PRN
  Administered 2015-06-17: 500 [IU]
  Filled 2015-06-17: qty 5

## 2015-06-17 MED ORDER — ACETAMINOPHEN 325 MG PO TABS
650.0000 mg | ORAL_TABLET | Freq: Once | ORAL | Status: AC
Start: 2015-06-17 — End: 2015-06-17
  Administered 2015-06-17: 650 mg via ORAL

## 2015-06-17 MED ORDER — DIPHENHYDRAMINE HCL 25 MG PO CAPS
25.0000 mg | ORAL_CAPSULE | Freq: Once | ORAL | Status: AC
  Administered 2015-06-17: 25 mg via ORAL

## 2015-06-17 NOTE — Progress Notes (Addendum)
[place]  Telephone:(336) [phone] [phone]     ID: Cynthia [name] DOB: 1966-10-06  MR#: [id number]  KLK#:[id number]  Patient Care Team: No Pcp Per Patient as PCP - General (General Practice) Rolm Bookbinder, MD as Consulting Physician (General Surgery) Chauncey Cruel, MD as Consulting Physician (Oncology) Kyung Rudd, MD as Consulting Physician (Radiation Oncology) Mauro Kaufmann, RN as Registered Nurse Rockwell Germany, RN as Registered Nurse PCP: No PCP Per Patient OTHER MD: Christene Slates M.D.  CHIEF COMPLAINT: Triple positive breast cancer  CURRENT TREATMENT: Neoadjuvant chemotherapy   BREAST CANCER HISTORY: From the original intake note:  Cynthia [name] had noted a change in her left breast on self-exam the last week in March. She brought it to Dr. Trellis Paganini attention and on 04/13/2015 underwent diagnostic bilateral mammography and left ultrasonography at [facility]. This found the breast composition to be category D. An area of architectural distortion was noted in the left breast upper outer quadrant correlating to the palpable mass. By ultrasound there was a 2 cm lobulated mass in the left breast upper outer quadrant which was hypoechoic. There was a 4 mm nodule immediately adjacent to this felt to be a satellite lesion.  Biopsy of the left breast mass in question 04/14/2015 showed ([id number]) invasive ductal carcinoma, grade 2, estrogen receptor 90% positive, progesterone receptor 90% positive, with an MIB-1 of 20%, and HER-2 amplified, the signals ratio being 5.36.  The patient's subsequent history is as detailed below  INTERVAL HISTORY: Cynthia [name] returns today for follow up of her breast cancer. Today is day 1, cycle 3 of 6 cycles planned of carboplatin, docetaxel, trastuzumab and pertuzumab.  REVIEW OF SYSTEMS: Cynthia [name] denies fevers or chills. Her nausea is minimal. She has occasional headaches from the IV zofran given as a premed before chemo for 2 days after  treatment, but none afterwards as she uses no PO zofran. She controls her bowels well with imodium. Her appetite and energy levels are fair. She is down to just 1 day in the gym weekly now. She denies mouth sores, or neuropathy symptoms. She has a chronic rash from sun exposure to her arms, but takes minocycline daily. Her eyes and nose run daily, and she takes claritin. A detailed review of systems is otherwise stable.  PAST MEDICAL HISTORY: Past Medical History  Diagnosis Date  . 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 Anxiety   . Hot flashes    melanoma, removed in [place]   PAST SURGICAL HISTORY: Past Surgical History  Procedure Laterality Date  . Bladder suspension  2009  . Dnc    . Dilation and curettage of uterus    . Portacath placement Right 04/27/2015    Procedure: INSERTION PORT-A-CATH;  Surgeon: Rolm Bookbinder, MD;  Location: [place];  Service: General;  Laterality: Right;    FAMILY HISTORY Family History  Problem Relation Age of Onset  . Breast cancer Maternal Aunt   . Lung cancer Maternal Grandmother   . Lung cancer Paternal Grandfather    the patient's parents are still living as of April 2016. They are divorced. The patient is an only child. The patient's paternal grandfather and maternal grandmother had a history of lung cancer. On the mother's side there is a history of melanoma. The patient's mother's only sister was diagnosed with breast cancer in her 33s. There is no history of ovarian cancer in the family.   GYNECOLOGIC HISTORY:  No LMP recorded.  menarche age 18, first live birth age 47, the patient is GX P3. Her periods are ongoing but irregular. She has a Mirena in place for contraception. She took oral contraceptives for approximately 15 years in the past with no complications.   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 She is single, and lives at home with her father,  who is disabled and has a history of depression. At home also is [name] "Cynthia [name], 50 years old as of April 2016. The patient's 2 other daughters are Cynthia [name], lives in [place] and is a physical therapy technician, and Cynthia [name], lives in [facility] and works in Press photographer.     ADVANCED DIRECTIVES:  not in place.    HEALTH MAINTENANCE: History  Substance Use Topics  . Smoking status: Former Smoker    Types: Cigarettes    Quit date: 04/22/2009  . Smokeless tobacco: Not on file  . Alcohol Use: Yes     Comment: occasion     Colonoscopy: never   PAP:  Bone density: never   Lipid panel:  Allergies  Allergen Reactions  . Zofran [Ondansetron Hcl] Other ([name] Comments)    headache  . Adhesive [Tape] Rash    Steri strip    Current Outpatient Prescriptions  Medication Sig Dispense Refill  . ALPRAZolam (XANAX) 1 MG tablet     . cycloSPORINE (RESTASIS) 0.05 % ophthalmic emulsion Place 1 drop into both eyes 2 (two) times daily. 30 mL 3  . dexamethasone (DECADRON) 4 MG tablet Take 2 tablets (8 mg total) by mouth 2 (two) times daily. Start the day before Taxotere. Then again the day after chemo for 3 days. 30 tablet 1  . famotidine (PEPCID) 20 MG tablet Take 1 tablet (20 mg total) by mouth 2 (two) times daily. 30 tablet 0  . lidocaine-prilocaine (EMLA) cream Apply to affected area once 30 g 3  . loratadine (CLARITIN) 10 MG tablet Take 1 tablet (10 mg total) by mouth daily. Daily for 3-5 days starting on neulasta injection day 30 tablet 2  . minocycline (MINOCIN) 100 MG capsule Take 1 capsule (100 mg total) by mouth 2 (two) times daily. 100 capsule 3  . Multiple Vitamins-Minerals (CENTRUM SILVER PO) Take by mouth.    . prochlorperazine (COMPAZINE) 10 MG tablet Take 1 tablet (10 mg total) by mouth every 6 (six) hours as needed (Nausea or vomiting). 30 tablet 1   No current facility-administered medications for this visit.    OBJECTIVE: middle-aged white woman  Filed  Vitals:   06/17/15 0925  BP: 143/84  Pulse: 109  Temp: 98.4 F (36.9 C)  Resp: 18     Body mass index is 27.65 kg/(m^2).    ECOG FS:1 - Symptomatic but completely ambulatory  Sclerae unicteric, pupils round and equal Oropharynx clear and moist-- no thrush or other lesions No cervical or supraclavicular adenopathy Lungs no rales or rhonchi Heart regular rate and rhythm Abd soft, nontender, positive bowel sounds MSK no focal spinal tenderness, no upper extremity lymphedema Neuro: nonfocal, well oriented, appropriate affect Breasts: deferred  LAB RESULTS:  CMP     Component Value Date/Time   NA 142 06/17/2015 0913   K 4.0 06/17/2015 0913   CO2 25 06/17/2015 0913   GLUCOSE 136 06/17/2015 0913   BUN 15.8 06/17/2015 0913   CREATININE 0.7 06/17/2015 0913   CALCIUM 8.9 06/17/2015 0913   PROT 6.3* 06/17/2015 0913   ALBUMIN 3.4* 06/17/2015 0913  AST 16 06/17/2015 0913   ALT 23 06/17/2015 0913   ALKPHOS 88 06/17/2015 0913   BILITOT 0.37 06/17/2015 0913    INo results found for: SPEP, UPEP  Lab Results  Component Value Date   WBC 8.5 06/17/2015   NEUTROABS 6.8* 06/17/2015   HGB 12.1 06/17/2015   HCT 35.6 06/17/2015   MCV 100.0 06/17/2015   PLT 199 06/17/2015      Chemistry      Component Value Date/Time   NA 142 06/17/2015 0913   K 4.0 06/17/2015 0913   CO2 25 06/17/2015 0913   BUN 15.8 06/17/2015 0913   CREATININE 0.7 06/17/2015 0913      Component Value Date/Time   CALCIUM 8.9 06/17/2015 0913   ALKPHOS 88 06/17/2015 0913   AST 16 06/17/2015 0913   ALT 23 06/17/2015 0913   BILITOT 0.37 06/17/2015 0913       No results found for: LABCA2  No components found for: LABCA125  No results for input(s): INR in the last 168 hours.  Urinalysis No results found for: COLORURINE, APPEARANCEUR, LABSPEC, PHURINE, GLUCOSEU, HGBUR, BILIRUBINUR, KETONESUR, PROTEINUR, UROBILINOGEN, NITRITE, LEUKOCYTESUR  STUDIES: No results found.  ASSESSMENT: 49 y.o. [place]  woman status post left breast biopsy 04/14/2015 for a clinical T2 N0, stage IIA invasive ductal carcinoma, grade 2, estrogen and progesterone receptor positive, with HER-2 amplified, and an MIB-1 of 20%  (1). Neoadjuvant chemotherapy consisting of carboplatin, docetaxel, trastuzumab and pertuzumab every 21 days 6, with onpro support, satrted 05/06/2015  (a) pertuzumab held cycles 2 and 3 due to rash -- to resume possibly with cycle 4  (2) trastuzumab to be continued to total one year  (a) most recent echo 04/30/2015 shows EF 55-60%  (3) surgery to follow chemotherapy  (4) radiation to follow surgery  (5) anti-estrogens to follow radiation  (6) genetics testing pending  PLAN: Kareem continues to manage treatments well. The labs were reviewed in detail and were entirely stable. She will proceed with cycle 3 of carboplatin, docetaxel, trastuzumab as planned today. Dr. Jana Hakim would like to hold the perjeta for an additional cycle and possibly resume with cycle 4. There rash has significantly improved, and the patient continues on minocycline daily.    The patient states she is no longer able to palpate her mass, and is optimistic about her final results.  Krystel will return in 1 week for labs and a nadrir visit. She understands and agrees with this plan. She knows the goal of treatment in her case is cure. She has been encouraged to call with any issues that might arise before her next visit here.   Laurie Panda, NP   06/17/2015 10:34 AM

## 2015-06-17 NOTE — Patient Instructions (Signed)
[facility] Discharge Instructions for Patients Receiving Chemotherapy  Today you received the following chemotherapy agents: Herceptin, Taxotere, Carboplatin To help prevent nausea and vomiting after your treatment, we encourage you to take your nausea medication: Compazine 10 mg every 6 hours as needed.   If you develop nausea and vomiting that is not controlled by your nausea medication, call the clinic.   BELOW ARE SYMPTOMS THAT SHOULD BE REPORTED IMMEDIATELY:  *FEVER GREATER THAN 100.5 F  *CHILLS WITH OR WITHOUT FEVER  NAUSEA AND VOMITING THAT IS NOT CONTROLLED WITH YOUR NAUSEA MEDICATION  *UNUSUAL SHORTNESS OF BREATH  *UNUSUAL BRUISING OR BLEEDING  TENDERNESS IN MOUTH AND THROAT WITH OR WITHOUT PRESENCE OF ULCERS  *URINARY PROBLEMS  *BOWEL PROBLEMS  UNUSUAL RASH Items with * indicate a potential emergency and should be followed up as soon as possible.  Feel free to call the clinic you have any questions or concerns. The clinic phone number is [phone].  Please show the [place] at check-in to the Emergency Department and triage nurse.

## 2015-06-18 ENCOUNTER — Encounter: Payer: Self-pay | Admitting: *Deleted

## 2015-06-25 ENCOUNTER — Other Ambulatory Visit (HOSPITAL_BASED_OUTPATIENT_CLINIC_OR_DEPARTMENT_OTHER): Payer: BLUE CROSS/BLUE SHIELD

## 2015-06-25 ENCOUNTER — Encounter: Payer: Self-pay | Admitting: Physician Assistant

## 2015-06-25 ENCOUNTER — Ambulatory Visit (HOSPITAL_BASED_OUTPATIENT_CLINIC_OR_DEPARTMENT_OTHER): Payer: BLUE CROSS/BLUE SHIELD | Admitting: Physician Assistant

## 2015-06-25 VITALS — BP 122/71 | HR 101 | Temp 98.8°F | Resp 18 | Ht 68.0 in | Wt 180.2 lb

## 2015-06-25 DIAGNOSIS — G43009 Migraine without aura, not intractable, without status migrainosus: Secondary | ICD-10-CM

## 2015-06-25 DIAGNOSIS — C50412 Malignant neoplasm of upper-outer quadrant of left female breast: Secondary | ICD-10-CM

## 2015-06-25 LAB — CBC WITH DIFFERENTIAL/PLATELET
BASO%: 0.2 % (ref 0.0–2.0)
Basophils Absolute: 0 10*3/uL (ref 0.0–0.1)
EOS%: 0.2 % (ref 0.0–7.0)
Eosinophils Absolute: 0 10*3/uL (ref 0.0–0.5)
HCT: 34.7 % — ABNORMAL LOW (ref 34.8–46.6)
HGB: 11.7 g/dL (ref 11.6–15.9)
LYMPH%: 24.3 % (ref 14.0–49.7)
MCH: 33.9 pg (ref 25.1–34.0)
MCHC: 33.7 g/dL (ref 31.5–36.0)
MCV: 100.6 fL (ref 79.5–101.0)
MONO#: 1.5 10*3/uL — ABNORMAL HIGH (ref 0.1–0.9)
MONO%: 9.4 % (ref 0.0–14.0)
NEUT%: 65.9 % (ref 38.4–76.8)
NEUTROS ABS: 10.8 10*3/uL — AB (ref 1.5–6.5)
Platelets: 185 10*3/uL (ref 145–400)
RBC: 3.45 10*6/uL — ABNORMAL LOW (ref 3.70–5.45)
RDW: 14.7 % — ABNORMAL HIGH (ref 11.2–14.5)
WBC: 16.4 10*3/uL — AB (ref 3.9–10.3)
lymph#: 4 10*3/uL — ABNORMAL HIGH (ref 0.9–3.3)

## 2015-06-25 LAB — COMPREHENSIVE METABOLIC PANEL (CC13)
ALBUMIN: 3.3 g/dL — AB (ref 3.5–5.0)
ALK PHOS: 103 U/L (ref 40–150)
ALT: 25 U/L (ref 0–55)
AST: 19 U/L (ref 5–34)
Anion Gap: 7 mEq/L (ref 3–11)
BILIRUBIN TOTAL: 0.25 mg/dL (ref 0.20–1.20)
BUN: 18 mg/dL (ref 7.0–26.0)
CO2: 27 mEq/L (ref 22–29)
Calcium: 9.1 mg/dL (ref 8.4–10.4)
Chloride: 106 mEq/L (ref 98–109)
Creatinine: 0.8 mg/dL (ref 0.6–1.1)
EGFR: >90 mL/min/{1.73_m2} (ref >90)
Glucose: 91 mg/dl (ref 70–140)
Potassium: 4.1 mEq/L (ref 3.5–5.1)
Sodium: 140 mEq/L (ref 136–145)
Total Protein: 6 g/dL — ABNORMAL LOW (ref 6.4–8.3)

## 2015-06-25 NOTE — Progress Notes (Signed)
[place]  Telephone:(336) [phone] Fax:(336) [phone]     ID: Cynthia [name] DOB: [birth year]  MR#: [id number]  GQB#:[id number]  Patient Care Team: No Pcp Per Patient as PCP - General (General Practice) Rolm Bookbinder, MD as Consulting Physician (General Surgery) Chauncey Cruel, MD as Consulting Physician (Oncology) Kyung Rudd, MD as Consulting Physician (Radiation Oncology) Mauro Kaufmann, RN as Registered Nurse Rockwell Germany, RN as Registered Nurse PCP: No PCP Per Patient OTHER MD: Christene Slates M.D.  CHIEF COMPLAINT: Triple positive breast cancer  CURRENT TREATMENT: Neoadjuvant chemotherapy   BREAST CANCER HISTORY: From the original intake note:  Cynthia [name] had noted a change in her left breast on self-exam the last week in March. She brought it to Dr. Trellis Paganini attention and on 04/13/2015 underwent diagnostic bilateral mammography and left ultrasonography at [facility]. This found the breast composition to be category D. An area of architectural distortion was noted in the left breast upper outer quadrant correlating to the palpable mass. By ultrasound there was a 2 cm lobulated mass in the left breast upper outer quadrant which was hypoechoic. There was a 4 mm nodule immediately adjacent to this felt to be a satellite lesion.  Biopsy of the left breast mass in question 04/14/2015 showed ([id number]) invasive ductal carcinoma, grade 2, estrogen receptor 90% positive, progesterone receptor 90% positive, with an MIB-1 of 20%, and HER-2 amplified, the signals ratio being 5.36.  The patient's subsequent history is as detailed below  INTERVAL HISTORY: Cynthia [name] returns today for follow up of her breast cancer. Today is day 1, cycle 3 of 6 cycles planned of carboplatin, docetaxel, trastuzumab and pertuzumab.  REVIEW OF SYSTEMS: Cynthia [name] denies fevers or chills. Her nausea is minimal. She has occasional headaches from the IV zofran given as a premed before chemo for 2 days after  treatment, but none afterwards as she uses no PO zofran. She controls her bowels well with imodium. Her appetite and energy levels are fair. She reports having  Some emotional days, crying over the loss of her appetite and being able to taste food for a few days after chemotherapy. She is otherwise tolerating chemotherapy relatively well with the exception of low back and hip pain after the Neulasta (OnPro).She is down to just 1 day in the gym weekly now. She denies mouth sores, or neuropathy symptoms. She has a chronic rash from sun exposure to her arms, but takes minocycline daily. Her eyes and nose run daily, and she takes claritin. A detailed review of systems is otherwise stable.  PAST MEDICAL HISTORY: Past Medical History  Diagnosis Date  . 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 Anxiety   . Hot flashes    melanoma, removed in [place]   PAST SURGICAL HISTORY: Past Surgical History  Procedure Laterality Date  . Bladder suspension  2009  . Dnc    . Dilation and curettage of uterus    . Portacath placement Right 04/27/2015    Procedure: INSERTION PORT-A-CATH;  Surgeon: Rolm Bookbinder, MD;  Location: [place];  Service: General;  Laterality: Right;    FAMILY HISTORY Family History  Problem Relation Age of Onset  . Breast cancer Maternal Aunt   . Lung cancer Maternal Grandmother   . Lung cancer Paternal Grandfather    the patient's parents are still living as of April 2016. They are divorced. The patient is an only child. The patient's paternal grandfather and maternal grandmother had a history  of lung cancer. On the mother's side there is a history of melanoma. The patient's mother's only sister was diagnosed with breast cancer in her 60s. There is no history of ovarian cancer in the family.   GYNECOLOGIC HISTORY:  No LMP recorded.  menarche age 9, first live birth age 54, the patient is GX P3. Her periods are ongoing but  irregular. She has a Mirena in place for contraception. She took oral contraceptives for approximately 15 years in the past with no complications.   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 She is single, and lives at home with her father, who is disabled and has a history of depression. At home also is the patient's Cynthia Minor "[name], 50 years old as of April 2016. The patient's 2 other daughters are Cynthia [name], lives in [place] and is a physical therapy technician, and Cynthia [name], lives in [facility] and works in Press photographer.     ADVANCED DIRECTIVES:  not in place.    HEALTH MAINTENANCE: History  Substance Use Topics  . Smoking status: Former Smoker    Types: Cigarettes    Quit date: 04/22/2009  . Smokeless tobacco: Not on file  . Alcohol Use: Yes     Comment: occasion     Colonoscopy: never   PAP:  Bone density: never   Lipid panel:  Allergies  Allergen Reactions  . Zofran [Ondansetron Hcl] Other (See Comments)    headache  . Adhesive [Tape] Rash    Steri strip    Current Outpatient Prescriptions  Medication Sig Dispense Refill  . ALPRAZolam (XANAX) 1 MG tablet     . cycloSPORINE (RESTASIS) 0.05 % ophthalmic emulsion Place 1 drop into both eyes 2 (two) times daily. 30 mL 3  . dexamethasone (DECADRON) 4 MG tablet Take 2 tablets (8 mg total) by mouth 2 (two) times daily. Start the day before Taxotere. Then again the day after chemo for 3 days. 30 tablet 1  . famotidine (PEPCID) 20 MG tablet Take 1 tablet (20 mg total) by mouth 2 (two) times daily. 30 tablet 0  . lidocaine-prilocaine (EMLA) cream Apply to affected area once 30 g 3  . loratadine (CLARITIN) 10 MG tablet Take 1 tablet (10 mg total) by mouth daily. Daily for 3-5 days starting on neulasta injection day 30 tablet 2  . minocycline (MINOCIN) 100 MG capsule Take 1 capsule (100 mg total) by mouth 2 (two) times daily. 100 capsule 3  . Multiple Vitamins-Minerals (CENTRUM SILVER PO)  Take by mouth.    . prochlorperazine (COMPAZINE) 10 MG tablet Take 1 tablet (10 mg total) by mouth every 6 (six) hours as needed (Nausea or vomiting). 30 tablet 1   No current facility-administered medications for this visit.    OBJECTIVE: middle-aged white woman  Filed Vitals:   06/25/15 1404  BP: 122/71  Pulse: 101  Temp: 98.8 F (37.1 C)  Resp: 18     Body mass index is 27.41 kg/(m^2).    ECOG FS:1 - Symptomatic but completely ambulatory  Sclerae unicteric, pupils round and equal Oropharynx clear and moist-- no thrush or other lesions No cervical or supraclavicular adenopathy Lungs no rales or rhonchi Heart regular rate and rhythm Abd soft, nontender, positive bowel sounds MSK no focal spinal tenderness, no upper extremity lymphedema Neuro: nonfocal, well oriented, appropriate affect Breasts: deferred  LAB RESULTS:  CMP     Component Value Date/Time   NA 140 06/25/2015 1321   K  4.1 06/25/2015 1321   CO2 27 06/25/2015 1321   GLUCOSE 91 06/25/2015 1321   BUN 18.0 06/25/2015 1321   CREATININE 0.8 06/25/2015 1321   CALCIUM 9.1 06/25/2015 1321   PROT 6.0* 06/25/2015 1321   ALBUMIN 3.3* 06/25/2015 1321   AST 19 06/25/2015 1321   ALT 25 06/25/2015 1321   ALKPHOS 103 06/25/2015 1321   BILITOT 0.25 06/25/2015 1321    INo results found for: SPEP, UPEP  Lab Results  Component Value Date   WBC 16.4* 06/25/2015   NEUTROABS 10.8* 06/25/2015   HGB 11.7 06/25/2015   HCT 34.7* 06/25/2015   MCV 100.6 06/25/2015   PLT 185 06/25/2015      Chemistry      Component Value Date/Time   NA 140 06/25/2015 1321   K 4.1 06/25/2015 1321   CO2 27 06/25/2015 1321   BUN 18.0 06/25/2015 1321   CREATININE 0.8 06/25/2015 1321      Component Value Date/Time   CALCIUM 9.1 06/25/2015 1321   ALKPHOS 103 06/25/2015 1321   AST 19 06/25/2015 1321   ALT 25 06/25/2015 1321   BILITOT 0.25 06/25/2015 1321       No results found for: LABCA2  No components found for: LABCA125  No  results for input(s): INR in the last 168 hours.  Urinalysis No results found for: COLORURINE, APPEARANCEUR, LABSPEC, PHURINE, GLUCOSEU, HGBUR, BILIRUBINUR, KETONESUR, PROTEINUR, UROBILINOGEN, NITRITE, LEUKOCYTESUR  STUDIES: No results found.  ASSESSMENT: 49 y.o. [place] woman status post left breast biopsy 04/14/2015 for a clinical T2 N0, stage IIA invasive ductal carcinoma, grade 2, estrogen and progesterone receptor positive, with HER-2 amplified, and an MIB-1 of 20%  (1). Neoadjuvant chemotherapy consisting of carboplatin, docetaxel, trastuzumab and pertuzumab every 21 days 6, with onpro support, satrted 05/06/2015  (a) pertuzumab held cycles 2 and 3 due to rash -- to resume possibly with cycle 4  (2) trastuzumab to be continued to total one year  (a) most recent echo 04/30/2015 shows EF 55-60%  (3) surgery to follow chemotherapy  (4) radiation to follow surgery  (5) anti-estrogens to follow radiation  (6) genetics testing pending  PLAN: Kauri continues to manage treatments well. The labs were reviewed in detail and were entirely stable. She is status post 3 cycles of carboplatin, docetaxel, trastuzumab as planned today. Dr. Jana Hakim would like to hold the perjeta for an additional cycle and possibly resume with cycle 4. There rash has significantly improved, and the patient continues on minocycline daily.    The patient states she is no longer able to palpate her mass, and is optimistic about her final results.  Luellen will return in 2 week for labs and a cycle #4 and in 3 weeks for a nadir and symptom management check. She understands and agrees with this plan. She knows the goal of treatment in her case is cure. She has been encouraged to call with any issues that might arise before her next visit here.   Carlton Adam, PA-C   06/25/2015 4:18 PM

## 2015-06-30 NOTE — Patient Instructions (Signed)
Continue labs and chemotherapy as scheduled Follow up in 3 weeks 

## 2015-07-08 ENCOUNTER — Other Ambulatory Visit (HOSPITAL_BASED_OUTPATIENT_CLINIC_OR_DEPARTMENT_OTHER): Payer: BLUE CROSS/BLUE SHIELD

## 2015-07-08 ENCOUNTER — Other Ambulatory Visit: Payer: Self-pay | Admitting: Oncology

## 2015-07-08 ENCOUNTER — Ambulatory Visit (HOSPITAL_BASED_OUTPATIENT_CLINIC_OR_DEPARTMENT_OTHER): Payer: BLUE CROSS/BLUE SHIELD

## 2015-07-08 ENCOUNTER — Encounter: Payer: Self-pay | Admitting: *Deleted

## 2015-07-08 VITALS — BP 118/74 | HR 90 | Temp 98.4°F | Resp 18

## 2015-07-08 DIAGNOSIS — G43009 Migraine without aura, not intractable, without status migrainosus: Secondary | ICD-10-CM

## 2015-07-08 DIAGNOSIS — C50412 Malignant neoplasm of upper-outer quadrant of left female breast: Secondary | ICD-10-CM

## 2015-07-08 DIAGNOSIS — Z5189 Encounter for other specified aftercare: Secondary | ICD-10-CM | POA: Diagnosis not present

## 2015-07-08 DIAGNOSIS — Z5112 Encounter for antineoplastic immunotherapy: Secondary | ICD-10-CM | POA: Diagnosis not present

## 2015-07-08 LAB — CBC WITH DIFFERENTIAL/PLATELET
BASO%: 0.3 % (ref 0.0–2.0)
BASOS ABS: 0 10*3/uL (ref 0.0–0.1)
EOS%: 0 % (ref 0.0–7.0)
Eosinophils Absolute: 0 10*3/uL (ref 0.0–0.5)
HCT: 35.4 % (ref 34.8–46.6)
HEMOGLOBIN: 11.9 g/dL (ref 11.6–15.9)
LYMPH#: 1.9 10*3/uL (ref 0.9–3.3)
LYMPH%: 15.2 % (ref 14.0–49.7)
MCH: 34 pg (ref 25.1–34.0)
MCHC: 33.5 g/dL (ref 31.5–36.0)
MCV: 101.5 fL — ABNORMAL HIGH (ref 79.5–101.0)
MONO#: 1.5 10*3/uL — ABNORMAL HIGH (ref 0.1–0.9)
MONO%: 11.9 % (ref 0.0–14.0)
NEUT#: 8.9 10*3/uL — ABNORMAL HIGH (ref 1.5–6.5)
NEUT%: 72.6 % (ref 38.4–76.8)
Platelets: 201 10*3/uL (ref 145–400)
RBC: 3.49 10*6/uL — ABNORMAL LOW (ref 3.70–5.45)
RDW: 16.7 % — AB (ref 11.2–14.5)
WBC: 12.3 10*3/uL — ABNORMAL HIGH (ref 3.9–10.3)

## 2015-07-08 LAB — COMPREHENSIVE METABOLIC PANEL (CC13)
ALT: 21 U/L (ref 0–55)
ANION GAP: 7 meq/L (ref 3–11)
AST: 16 U/L (ref 5–34)
Albumin: 3.5 g/dL (ref 3.5–5.0)
Alkaline Phosphatase: 74 U/L (ref 40–150)
BILIRUBIN TOTAL: 0.3 mg/dL (ref 0.20–1.20)
BUN: 14.9 mg/dL (ref 7.0–26.0)
CALCIUM: 9.5 mg/dL (ref 8.4–10.4)
CO2: 24 meq/L (ref 22–29)
CREATININE: 0.7 mg/dL (ref 0.6–1.1)
Chloride: 109 mEq/L (ref 98–109)
GLUCOSE: 91 mg/dL (ref 70–140)
POTASSIUM: 3.7 meq/L (ref 3.5–5.1)
SODIUM: 140 meq/L (ref 136–145)
Total Protein: 6.4 g/dL (ref 6.4–8.3)

## 2015-07-08 MED ORDER — SODIUM CHLORIDE 0.9 % IV SOLN
Freq: Once | INTRAVENOUS | Status: AC
  Administered 2015-07-08: 12:00:00 via INTRAVENOUS

## 2015-07-08 MED ORDER — SODIUM CHLORIDE 0.9 % IV SOLN
600.0000 mg | Freq: Once | INTRAVENOUS | Status: AC
  Administered 2015-07-08: 600 mg via INTRAVENOUS
  Filled 2015-07-08: qty 60

## 2015-07-08 MED ORDER — PEGFILGRASTIM 6 MG/0.6ML ~~LOC~~ PSKT
6.0000 mg | PREFILLED_SYRINGE | Freq: Once | SUBCUTANEOUS | Status: AC
  Administered 2015-07-08: 6 mg via SUBCUTANEOUS
  Filled 2015-07-08: qty 0.6

## 2015-07-08 MED ORDER — SODIUM CHLORIDE 0.9 % IV SOLN
Freq: Once | INTRAVENOUS | Status: AC
  Administered 2015-07-08: 12:00:00 via INTRAVENOUS
  Filled 2015-07-08: qty 8

## 2015-07-08 MED ORDER — ACETAMINOPHEN 325 MG PO TABS
ORAL_TABLET | ORAL | Status: AC
  Filled 2015-07-08: qty 2

## 2015-07-08 MED ORDER — ACETAMINOPHEN 325 MG PO TABS
650.0000 mg | ORAL_TABLET | Freq: Once | ORAL | Status: AC
  Administered 2015-07-08: 650 mg via ORAL

## 2015-07-08 MED ORDER — SODIUM CHLORIDE 0.9 % IJ SOLN
10.0000 mL | INTRAMUSCULAR | Status: DC | PRN
  Administered 2015-07-08: 10 mL
  Filled 2015-07-08: qty 10

## 2015-07-08 MED ORDER — TRASTUZUMAB CHEMO INJECTION 440 MG
6.0000 mg/kg | Freq: Once | INTRAVENOUS | Status: AC
  Administered 2015-07-08: 483 mg via INTRAVENOUS
  Filled 2015-07-08: qty 23

## 2015-07-08 MED ORDER — DIPHENHYDRAMINE HCL 25 MG PO CAPS
25.0000 mg | ORAL_CAPSULE | Freq: Once | ORAL | Status: AC
  Administered 2015-07-08: 25 mg via ORAL

## 2015-07-08 MED ORDER — DIPHENHYDRAMINE HCL 25 MG PO CAPS
ORAL_CAPSULE | ORAL | Status: AC
  Filled 2015-07-08: qty 1

## 2015-07-08 MED ORDER — DOCETAXEL CHEMO INJECTION 160 MG/16ML
75.0000 mg/m2 | Freq: Once | INTRAVENOUS | Status: AC
  Administered 2015-07-08: 150 mg via INTRAVENOUS
  Filled 2015-07-08: qty 15

## 2015-07-08 MED ORDER — HEPARIN SOD (PORK) LOCK FLUSH 100 UNIT/ML IV SOLN
500.0000 [IU] | Freq: Once | INTRAVENOUS | Status: AC | PRN
  Administered 2015-07-08: 500 [IU]
  Filled 2015-07-08: qty 5

## 2015-07-08 NOTE — Patient Instructions (Addendum)
[facility] Discharge Instructions for Patients Receiving Chemotherapy  Today you received the following chemotherapy agents: Herceptin, Taxotere,Carboplatin  To help prevent nausea and vomiting after your treatment, we encourage you to take your nausea medication as directed.    If you develop nausea and vomiting that is not controlled by your nausea medication, call the clinic.   BELOW ARE SYMPTOMS THAT SHOULD BE REPORTED IMMEDIATELY:  *FEVER GREATER THAN 100.5 F  *CHILLS WITH OR WITHOUT FEVER  NAUSEA AND VOMITING THAT IS NOT CONTROLLED WITH YOUR NAUSEA MEDICATION  *UNUSUAL SHORTNESS OF BREATH  *UNUSUAL BRUISING OR BLEEDING  TENDERNESS IN MOUTH AND THROAT WITH OR WITHOUT PRESENCE OF ULCERS  *URINARY PROBLEMS  *BOWEL PROBLEMS  UNUSUAL RASH Items with * indicate a potential emergency and should be followed up as soon as possible.  Feel free to call the clinic you have any questions or concerns. The clinic phone number is [phone].  Please show the [facility] at check-in to the Emergency Department and triage nurse.   Pegfilgrastim injection What is this medicine? PEGFILGRASTIM (peg fil GRA stim) is a long-acting granulocyte colony-stimulating factor that stimulates the growth of neutrophils, a type of white blood cell important in the body's fight against infection. It is used to reduce the incidence of fever and infection in patients with certain types of cancer who are receiving chemotherapy that affects the bone marrow. This medicine may be used for other purposes; ask your health care provider or pharmacist if you have questions. COMMON BRAND NAME(S): Neulasta What should I tell my health care provider before I take this medicine? They need to know if you have any of these conditions: -latex allergy -ongoing radiation therapy -sickle cell disease -skin reactions to acrylic adhesives (On-Body Injector only) -an unusual or allergic reaction to  pegfilgrastim, filgrastim, other medicines, foods, dyes, or preservatives -pregnant or trying to get pregnant -breast-feeding How should I use this medicine? This medicine is for injection under the skin. If you get this medicine at home, you will be taught how to prepare and give the pre-filled syringe or how to use the On-body Injector. Refer to the patient Instructions for Use for detailed instructions. Use exactly as directed. Take your medicine at regular intervals. Do not take your medicine more often than directed. It is important that you put your used needles and syringes in a special sharps container. Do not put them in a trash can. If you do not have a sharps container, call your pharmacist or healthcare provider to get one. Talk to your pediatrician regarding the use of this medicine in children. Special care may be needed. Overdosage: If you think you have taken too much of this medicine contact a poison control center or emergency room at once. NOTE: This medicine is only for you. Do not share this medicine with others. What if I miss a dose? It is important not to miss your dose. Call your doctor or health care professional if you miss your dose. If you miss a dose due to an On-body Injector failure or leakage, a new dose should be administered as soon as possible using a single prefilled syringe for manual use. What may interact with this medicine? Interactions have not been studied. Give your health care provider a list of all the medicines, herbs, non-prescription drugs, or dietary supplements you use. Also tell them if you smoke, drink alcohol, or use illegal drugs. Some items may interact with your medicine. This list may not  describe all possible interactions. Give your health care provider a list of all the medicines, herbs, non-prescription drugs, or dietary supplements you use. Also tell them if you smoke, drink alcohol, or use illegal drugs. Some items may interact with your  medicine. What should I watch for while using this medicine? You may need blood work done while you are taking this medicine. If you are going to need a MRI, CT scan, or other procedure, tell your doctor that you are using this medicine (On-Body Injector only). What side effects may I notice from receiving this medicine? Side effects that you should report to your doctor or health care professional as soon as possible: -allergic reactions like skin rash, itching or hives, swelling of the face, lips, or tongue -dizziness -fever -pain, redness, or irritation at site where injected -pinpoint red spots on the skin -shortness of breath or breathing problems -stomach or side pain, or pain at the shoulder -swelling -tiredness -trouble passing urine Side effects that usually do not require medical attention (report to your doctor or health care professional if they continue or are bothersome): -bone pain -muscle pain This list may not describe all possible side effects. Call your doctor for medical advice about side effects. You may report side effects to FDA at 1-800-FDA-1088. Where should I keep my medicine? Keep out of the reach of children. Store pre-filled syringes in a refrigerator between 2 and 8 degrees C (36 and 46 degrees F). Do not freeze. Keep in carton to protect from light. Throw away this medicine if it is left out of the refrigerator for more than 48 hours. Throw away any unused medicine after the expiration date. NOTE: This sheet is a summary. It may not cover all possible information. If you have questions about this medicine, talk to your doctor, pharmacist, or health care provider.  2015, Elsevier/Gold Standard. (2014-03-12 16:14:05)

## 2015-07-08 NOTE — Progress Notes (Signed)
Pt reports mild swelling in bilateral feet starting this past weekend. Dr. Jana Hakim aware and wants pt to start taking one decadron instead of two. Pt aware and verbalizes understanding, but does not feel as if the swelling is caused by the steroids. Pt educated to call clinic if symptom worsens. Pt verbalizes understanding.

## 2015-07-13 ENCOUNTER — Encounter: Payer: Self-pay | Admitting: Oncology

## 2015-07-13 NOTE — Progress Notes (Signed)
Pt is approved with [place] card for Herceptin and Perjeta effective 07/09/15 to 07/07/16 for $25,000 each.  The company will pay for claims 120 days back.  I forwarded copies of approval letters to Arline Asp with the billing dept.

## 2015-07-15 ENCOUNTER — Encounter: Payer: Self-pay | Admitting: Nurse Practitioner

## 2015-07-15 ENCOUNTER — Other Ambulatory Visit (HOSPITAL_BASED_OUTPATIENT_CLINIC_OR_DEPARTMENT_OTHER): Payer: BLUE CROSS/BLUE SHIELD

## 2015-07-15 ENCOUNTER — Encounter: Payer: Self-pay | Admitting: *Deleted

## 2015-07-15 ENCOUNTER — Ambulatory Visit (HOSPITAL_BASED_OUTPATIENT_CLINIC_OR_DEPARTMENT_OTHER): Payer: BLUE CROSS/BLUE SHIELD | Admitting: Nurse Practitioner

## 2015-07-15 ENCOUNTER — Telehealth: Payer: Self-pay | Admitting: Nurse Practitioner

## 2015-07-15 VITALS — BP 119/73 | HR 96 | Temp 98.1°F | Resp 18 | Wt 180.4 lb

## 2015-07-15 DIAGNOSIS — C50412 Malignant neoplasm of upper-outer quadrant of left female breast: Secondary | ICD-10-CM

## 2015-07-15 DIAGNOSIS — R232 Flushing: Secondary | ICD-10-CM | POA: Insufficient documentation

## 2015-07-15 DIAGNOSIS — G43009 Migraine without aura, not intractable, without status migrainosus: Secondary | ICD-10-CM | POA: Diagnosis not present

## 2015-07-15 DIAGNOSIS — N951 Menopausal and female climacteric states: Secondary | ICD-10-CM

## 2015-07-15 LAB — CBC WITH DIFFERENTIAL/PLATELET
BASO%: 0.6 % (ref 0.0–2.0)
Basophils Absolute: 0.1 10*3/uL (ref 0.0–0.1)
EOS ABS: 0 10*3/uL (ref 0.0–0.5)
EOS%: 0.3 % (ref 0.0–7.0)
HCT: 34.3 % — ABNORMAL LOW (ref 34.8–46.6)
HGB: 11.7 g/dL (ref 11.6–15.9)
LYMPH%: 24 % (ref 14.0–49.7)
MCH: 34.7 pg — ABNORMAL HIGH (ref 25.1–34.0)
MCHC: 34 g/dL (ref 31.5–36.0)
MCV: 102.2 fL — AB (ref 79.5–101.0)
MONO#: 1.1 10*3/uL — ABNORMAL HIGH (ref 0.1–0.9)
MONO%: 11.3 % (ref 0.0–14.0)
NEUT#: 6.4 10*3/uL (ref 1.5–6.5)
NEUT%: 63.8 % (ref 38.4–76.8)
Platelets: 155 10*3/uL (ref 145–400)
RBC: 3.35 10*6/uL — ABNORMAL LOW (ref 3.70–5.45)
RDW: 16.3 % — AB (ref 11.2–14.5)
WBC: 10 10*3/uL (ref 3.9–10.3)
lymph#: 2.4 10*3/uL (ref 0.9–3.3)

## 2015-07-15 LAB — COMPREHENSIVE METABOLIC PANEL (CC13)
ALK PHOS: 98 U/L (ref 40–150)
ALT: 25 U/L (ref 0–55)
AST: 17 U/L (ref 5–34)
Albumin: 3.3 g/dL — ABNORMAL LOW (ref 3.5–5.0)
Anion Gap: 3 mEq/L (ref 3–11)
BUN: 12.4 mg/dL (ref 7.0–26.0)
CALCIUM: 8.8 mg/dL (ref 8.4–10.4)
CHLORIDE: 109 meq/L (ref 98–109)
CO2: 28 meq/L (ref 22–29)
CREATININE: 0.7 mg/dL (ref 0.6–1.1)
EGFR: >90 mL/min/{1.73_m2} (ref >90)
Glucose: 97 mg/dl (ref 70–140)
POTASSIUM: 4.1 meq/L (ref 3.5–5.1)
Sodium: 139 mEq/L (ref 136–145)
Total Bilirubin: 0.2 mg/dL (ref 0.20–1.20)
Total Protein: 6 g/dL — ABNORMAL LOW (ref 6.4–8.3)

## 2015-07-15 MED ORDER — VENLAFAXINE HCL ER 37.5 MG PO TB24: 1.0000 | ORAL_TABLET | Freq: Every day | ORAL | Status: DC

## 2015-07-15 MED ORDER — GABAPENTIN 300 MG PO CAPS: 300.0000 mg | ORAL_CAPSULE | Freq: Every day | ORAL | Status: DC

## 2015-07-15 NOTE — Telephone Encounter (Signed)
Pt confirmed labs/ov per 07/21 POF, gave pt AVS and Calendar... KJ

## 2015-07-15 NOTE — Progress Notes (Signed)
[facility]  [phone] [phone]) [phone]     ID: Cynthia [name] DOB: 09-Nov-1966  MR#: [id number]  QTM#:[id number]  Patient Care Team: No Pcp Per Patient as PCP - General (General Practice) Rolm Bookbinder, MD as Consulting Physician (General Surgery) Chauncey Cruel, MD as Consulting Physician (Oncology) Kyung Rudd, MD as Consulting Physician (Radiation Oncology) Mauro Kaufmann, RN as Registered Nurse Rockwell Germany, RN as Registered Nurse PCP: No PCP Per Patient OTHER MD: Christene Slates M.D.  CHIEF COMPLAINT: Triple positive breast cancer  CURRENT TREATMENT: Neoadjuvant chemotherapy   BREAST CANCER HISTORY: From the original intake note:  Cynthia [name] had noted a change in her left breast on self-exam the last week in March. She brought it to Dr. Trellis Paganini attention and on 04/13/2015 underwent diagnostic bilateral mammography and left ultrasonography at [facility]. This found the breast composition to be category D. An area of architectural distortion was noted in the left breast upper outer quadrant correlating to the palpable mass. By ultrasound there was a 2 cm lobulated mass in the left breast upper outer quadrant which was hypoechoic. There was a 4 mm nodule immediately adjacent to this felt to be a satellite lesion.  Biopsy of the left breast mass in question 04/14/2015 showed ([id number]) invasive ductal carcinoma, grade 2, estrogen receptor 90% positive, progesterone receptor 90% positive, with an MIB-1 of 20%, and HER-2 amplified, the signals ratio being 5.36.  The patient's subsequent history is as detailed below  INTERVAL HISTORY: Cynthia [name] returns today for follow up of her breast cancer. Today is day 8, cycle 4 of 6 cycles planned of carboplatin, docetaxel, trastuzumab and pertuzumab. Cynthia [name] has had a rough week mentally so far. She is under pressure at work, attended 2 funerals, and is having behavioral trouble with her youngest teenaged daughter. She is  stressed, and not sleeping well, but otherwise performing well physically.   REVIEW OF SYSTEMS: Cynthia [name] denies fevers, chills, or changes with bowel or bladder habits. She had no true diarrhea, and her nausea was well controlled on just 1 decadron tablet BID x 3 days. She did not use the compazine or zofran. Her appetite is down secondary to taste changes. She denies mouth sores or neuropathy symptoms. She does have the same chronic rash to her arms, but it is minimal and managed with minocycline. She continues to have runny eyes and a runny nose, but takes her claritin daily. She has some low back and hip pain with neulasta. Her hot flashes are gaining in intensity and this may be her main complaint today. A detailed review of systems is otherwise stable.  PAST MEDICAL HISTORY: Past Medical History  Diagnosis Date  . 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 Anxiety   . Hot flashes    melanoma, removed in [place]   PAST SURGICAL HISTORY: Past Surgical History  Procedure Laterality Date  . Bladder suspension  2009  . Dnc    . Dilation and curettage of uterus    . Portacath placement Right 04/27/2015    Procedure: INSERTION PORT-A-CATH;  Surgeon: Rolm Bookbinder, MD;  Location: [facility];  Service: General;  Laterality: Right;    FAMILY HISTORY Family History  Problem Relation Age of Onset  . Breast cancer Maternal Aunt   . Lung cancer Maternal Grandmother   . Lung cancer Paternal Grandfather    the patient's parents are still living as of April 2016. They are divorced. The patient is  an only child. The patient's paternal grandfather and maternal grandmother had a history of lung cancer. On the mother's side there is a history of melanoma. The patient's mother's only sister was diagnosed with breast cancer in her 108s. There is no history of ovarian cancer in the family.   GYNECOLOGIC HISTORY:  No LMP recorded.  menarche age 31, first  live birth age 80, the patient is GX P3. Her periods are ongoing but irregular. She has a Mirena in place for contraception. She took oral contraceptives for approximately 15 years in the past with no complications.   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 She is single, and lives at home with her father, who is disabled and has a history of depression. At home also is [name] "Cynthia [name], 50 years old as of April 2016. The patient's 2 other daughters are Cynthia [name], lives in [place] and is a physical therapy technician, and Cynthia [name], lives in [facility] and works in Press photographer.     ADVANCED DIRECTIVES:  not in place.    HEALTH MAINTENANCE: History  Substance Use Topics  . Smoking status: Former Smoker    Types: Cigarettes    Quit date: 04/22/2009  . Smokeless tobacco: Not on file  . Alcohol Use: Yes     Comment: occasion     Colonoscopy: never   PAP:  Bone density: never   Lipid panel:  Allergies  Allergen Reactions  . Zofran [Ondansetron Hcl] Other (See Comments)    headache  . Adhesive [Tape] Rash    Steri strip    Current Outpatient Prescriptions  Medication Sig Dispense Refill  . ALPRAZolam (XANAX) 1 MG tablet     . dexamethasone (DECADRON) 4 MG tablet Take 2 tablets (8 mg total) by mouth 2 (two) times daily. Start the day before Taxotere. Then again the day after chemo for 3 days. 30 tablet 1  . famotidine (PEPCID) 20 MG tablet Take 1 tablet (20 mg total) by mouth 2 (two) times daily. 30 tablet 0  . lidocaine-prilocaine (EMLA) cream Apply to affected area once 30 g 3  . loratadine (CLARITIN) 10 MG tablet Take 1 tablet (10 mg total) by mouth daily. Daily for 3-5 days starting on neulasta injection day 30 tablet 2  . minocycline (MINOCIN) 100 MG capsule Take 1 capsule (100 mg total) by mouth 2 (two) times daily. 100 capsule 3  . Multiple Vitamins-Minerals (CENTRUM SILVER PO) Take by mouth.    Vladimir Faster  Glycol-Propyl Glycol (SYSTANE OP) Apply 1 drop to eye 2 (two) times daily.    . prochlorperazine (COMPAZINE) 10 MG tablet Take 1 tablet (10 mg total) by mouth every 6 (six) hours as needed (Nausea or vomiting). 30 tablet 1  . cycloSPORINE (RESTASIS) 0.05 % ophthalmic emulsion Place 1 drop into both eyes 2 (two) times daily. (Patient not taking: Reported on 07/15/2015) 30 mL 3  . gabapentin (NEURONTIN) 300 MG capsule Take 1 capsule (300 mg total) by mouth at bedtime. 30 capsule 5  . Venlafaxine HCl 37.5 MG TB24 Take 1 tablet (37.5 mg total) by mouth daily. 30 each 5   No current facility-administered medications for this visit.    OBJECTIVE: middle-aged white woman  Filed Vitals:   07/15/15 1310  BP: 119/73  Pulse: 96  Temp: 98.1 F (36.7 C)  Resp: 18     Body mass index is 27.44 kg/(m^2).    ECOG FS:1 - Symptomatic but completely  ambulatory  Skin: warm, dry  HEENT: sclerae anicteric, conjunctivae pink, oropharynx clear. No thrush or mucositis.  Lymph Nodes: No cervical or supraclavicular lymphadenopathy  Lungs: clear to auscultation bilaterally, no rales, wheezes, or rhonci  Heart: regular rate and rhythm  Abdomen: round, soft, non tender, positive bowel sounds  Musculoskeletal: No focal spinal tenderness, no peripheral edema  Neuro: non focal, well oriented, positive affect  Breasts: deferred  LAB RESULTS:  CMP     Component Value Date/Time   NA 139 07/15/2015 1243   K 4.1 07/15/2015 1243   CO2 28 07/15/2015 1243   GLUCOSE 97 07/15/2015 1243   BUN 12.4 07/15/2015 1243   CREATININE 0.7 07/15/2015 1243   CALCIUM 8.8 07/15/2015 1243   PROT 6.0* 07/15/2015 1243   ALBUMIN 3.3* 07/15/2015 1243   AST 17 07/15/2015 1243   ALT 25 07/15/2015 1243   ALKPHOS 98 07/15/2015 1243   BILITOT 0.20 07/15/2015 1243    INo results found for: SPEP, UPEP  Lab Results  Component Value Date   WBC 10.0 07/15/2015   NEUTROABS 6.4 07/15/2015   HGB 11.7 07/15/2015   HCT 34.3* 07/15/2015    MCV 102.2* 07/15/2015   PLT 155 07/15/2015      Chemistry      Component Value Date/Time   NA 139 07/15/2015 1243   K 4.1 07/15/2015 1243   CO2 28 07/15/2015 1243   BUN 12.4 07/15/2015 1243   CREATININE 0.7 07/15/2015 1243      Component Value Date/Time   CALCIUM 8.8 07/15/2015 1243   ALKPHOS 98 07/15/2015 1243   AST 17 07/15/2015 1243   ALT 25 07/15/2015 1243   BILITOT 0.20 07/15/2015 1243       No results found for: LABCA2  No components found for: LABCA125  No results for input(s): INR in the last 168 hours.  Urinalysis No results found for: COLORURINE, APPEARANCEUR, LABSPEC, PHURINE, GLUCOSEU, HGBUR, BILIRUBINUR, KETONESUR, PROTEINUR, UROBILINOGEN, NITRITE, LEUKOCYTESUR  STUDIES: No results found.  ASSESSMENT: 49 y.o. [place] woman status post left breast biopsy 04/14/2015 for a clinical T2 N0, stage IIA invasive ductal carcinoma, grade 2, estrogen and progesterone receptor positive, with HER-2 amplified, and an MIB-1 of 20%  (1). Neoadjuvant chemotherapy consisting of carboplatin, docetaxel, trastuzumab and pertuzumab every 21 days 6, with onpro support, satrted 05/06/2015  (a) pertuzumab held after 1st cycle because of rash  (2) trastuzumab to be continued to total one year  (a) most recent echo 04/30/2015 shows EF 55-60%  (3) surgery to follow chemotherapy  (4) radiation to follow surgery  (5) anti-estrogens to follow radiation  (6) genetics testing pending  PLAN: The labs were reviewed in detail and were entirely stable. She managed this last treatment well with no acute events. Her rash is minimal at this point, and is likely tied to the pertuzumab, which we have held for the past 4 cycles.  Because her hot flashes are gaining intensity, we are going to start her on venlafaxine daily and gabapentin QHS because she is sweating all day and night. We reviewed these drugs including potential side effects. She knows she will have to wean off the  venlafaxine if she would like to discontinue use, and understands the gabapentin is likely to cause drowsiness. She will start these drug at least 3-4 days apart from each other, so that she may delineate any allergic reactions that occur.  Terris will return in 2 week for labs and and cycle 5 of treatment. She understands and  agrees with this plan. She knows the goal of treatment in her case is cure. She has been encouraged to call with any issues that might arise before her next visit here.   Laurie Panda, NP   07/15/2015 4:42 PM

## 2015-07-16 ENCOUNTER — Encounter: Payer: Self-pay | Admitting: *Deleted

## 2015-07-16 NOTE — Progress Notes (Signed)
[facility] Work  Clinical Social Work was referred by Estate manager/land agent, Melissa Vogelsinger to assist with workplace concerns.   Clinical Social Worker met with patient at [facility] to offer support and assess for needs.  Pt considering taking FMLA in order to better focus on her treatment plan and deal with other life stressors on top of her cancer care. CSW reviewed additional resources that can assist with workplace rights and offered CSW counseling as well. CSW encouraged pt to consider attending support group as an additional support. Pt appreciated resources and agrees to follow up.   Clinical Social Work interventions: Supportive listening Resource education  Loren Racer, [place] Worker [place]  [place] Phone: [phone] Fax: [phone]

## 2015-07-26 ENCOUNTER — Other Ambulatory Visit: Payer: Self-pay | Admitting: *Deleted

## 2015-07-26 NOTE — Telephone Encounter (Signed)
Received faxed notice from [facility] that patient needs a 90 day refill on Restasis eye drops. Per 07/15/2015 medication list, it states "patient is not taking." Instructed patient to call [phone] and let us know if she needs this prescription refilled or not. Refill request is at Dr. Virgie Dad nurses desk.

## 2015-07-26 NOTE — Telephone Encounter (Signed)
Received voice message from patient stating," I don't need the Restasis eye drop refilled."  This RN faxed refill request back to [facility].

## 2015-07-29 ENCOUNTER — Other Ambulatory Visit: Payer: Self-pay | Admitting: Nurse Practitioner

## 2015-07-29 ENCOUNTER — Telehealth: Payer: Self-pay | Admitting: Nurse Practitioner

## 2015-07-29 ENCOUNTER — Encounter: Payer: Self-pay | Admitting: Nurse Practitioner

## 2015-07-29 ENCOUNTER — Other Ambulatory Visit (HOSPITAL_BASED_OUTPATIENT_CLINIC_OR_DEPARTMENT_OTHER): Payer: BLUE CROSS/BLUE SHIELD

## 2015-07-29 ENCOUNTER — Ambulatory Visit (HOSPITAL_BASED_OUTPATIENT_CLINIC_OR_DEPARTMENT_OTHER): Payer: BLUE CROSS/BLUE SHIELD | Admitting: Nurse Practitioner

## 2015-07-29 ENCOUNTER — Ambulatory Visit (HOSPITAL_BASED_OUTPATIENT_CLINIC_OR_DEPARTMENT_OTHER): Payer: BLUE CROSS/BLUE SHIELD

## 2015-07-29 VITALS — BP 136/76 | HR 84 | Temp 98.4°F | Resp 18 | Wt 180.1 lb

## 2015-07-29 DIAGNOSIS — Z5189 Encounter for other specified aftercare: Secondary | ICD-10-CM

## 2015-07-29 DIAGNOSIS — C50412 Malignant neoplasm of upper-outer quadrant of left female breast: Secondary | ICD-10-CM

## 2015-07-29 DIAGNOSIS — Z5111 Encounter for antineoplastic chemotherapy: Secondary | ICD-10-CM

## 2015-07-29 DIAGNOSIS — Z5112 Encounter for antineoplastic immunotherapy: Secondary | ICD-10-CM

## 2015-07-29 DIAGNOSIS — G43009 Migraine without aura, not intractable, without status migrainosus: Secondary | ICD-10-CM

## 2015-07-29 DIAGNOSIS — Z170 Estrogen receptor positive status [ER+]: Secondary | ICD-10-CM

## 2015-07-29 LAB — CBC WITH DIFFERENTIAL/PLATELET
BASO%: 0.3 % (ref 0.0–2.0)
BASOS ABS: 0 10*3/uL (ref 0.0–0.1)
EOS%: 0 % (ref 0.0–7.0)
Eosinophils Absolute: 0 10*3/uL (ref 0.0–0.5)
HCT: 35.7 % (ref 34.8–46.6)
HGB: 12.1 g/dL (ref 11.6–15.9)
LYMPH%: 22.8 % (ref 14.0–49.7)
MCH: 35.2 pg — ABNORMAL HIGH (ref 25.1–34.0)
MCHC: 33.9 g/dL (ref 31.5–36.0)
MCV: 103.9 fL — AB (ref 79.5–101.0)
MONO#: 1 10*3/uL — AB (ref 0.1–0.9)
MONO%: 12.8 % (ref 0.0–14.0)
NEUT#: 5.2 10*3/uL (ref 1.5–6.5)
NEUT%: 64.1 % (ref 38.4–76.8)
PLATELETS: 156 10*3/uL (ref 145–400)
RBC: 3.44 10*6/uL — ABNORMAL LOW (ref 3.70–5.45)
RDW: 16.4 % — ABNORMAL HIGH (ref 11.2–14.5)
WBC: 8.1 10*3/uL (ref 3.9–10.3)
lymph#: 1.9 10*3/uL (ref 0.9–3.3)

## 2015-07-29 LAB — COMPREHENSIVE METABOLIC PANEL (CC13)
ALT: 25 U/L (ref 0–55)
AST: 19 U/L (ref 5–34)
Albumin: 3.5 g/dL (ref 3.5–5.0)
Alkaline Phosphatase: 79 U/L (ref 40–150)
Anion Gap: 5 mEq/L (ref 3–11)
BILIRUBIN TOTAL: 0.47 mg/dL (ref 0.20–1.20)
BUN: 13.6 mg/dL (ref 7.0–26.0)
CALCIUM: 9.3 mg/dL (ref 8.4–10.4)
CO2: 29 mEq/L (ref 22–29)
Chloride: 109 mEq/L (ref 98–109)
Creatinine: 0.8 mg/dL (ref 0.6–1.1)
EGFR: >90 mL/min/{1.73_m2} (ref >90)
GLUCOSE: 83 mg/dL (ref 70–140)
POTASSIUM: 3.7 meq/L (ref 3.5–5.1)
Sodium: 143 mEq/L (ref 136–145)
TOTAL PROTEIN: 6.2 g/dL — AB (ref 6.4–8.3)

## 2015-07-29 MED ORDER — SODIUM CHLORIDE 0.9 % IV SOLN
Freq: Once | INTRAVENOUS | Status: AC
  Administered 2015-07-29: 12:00:00 via INTRAVENOUS

## 2015-07-29 MED ORDER — DIPHENHYDRAMINE HCL 25 MG PO CAPS
25.0000 mg | ORAL_CAPSULE | Freq: Once | ORAL | Status: AC
  Administered 2015-07-29: 25 mg via ORAL

## 2015-07-29 MED ORDER — DOCETAXEL CHEMO INJECTION 160 MG/16ML
75.0000 mg/m2 | Freq: Once | INTRAVENOUS | Status: AC
  Administered 2015-07-29: 150 mg via INTRAVENOUS
  Filled 2015-07-29: qty 15

## 2015-07-29 MED ORDER — ACETAMINOPHEN 325 MG PO TABS
ORAL_TABLET | ORAL | Status: AC
  Filled 2015-07-29: qty 2

## 2015-07-29 MED ORDER — HEPARIN SOD (PORK) LOCK FLUSH 100 UNIT/ML IV SOLN
500.0000 [IU] | Freq: Once | INTRAVENOUS | Status: AC | PRN
  Administered 2015-07-29: 500 [IU]
  Filled 2015-07-29: qty 5

## 2015-07-29 MED ORDER — SODIUM CHLORIDE 0.9 % IV SOLN
600.0000 mg | Freq: Once | INTRAVENOUS | Status: AC
  Administered 2015-07-29: 600 mg via INTRAVENOUS
  Filled 2015-07-29: qty 60

## 2015-07-29 MED ORDER — SODIUM CHLORIDE 0.9 % IV SOLN
6.0000 mg/kg | Freq: Once | INTRAVENOUS | Status: AC
  Administered 2015-07-29: 483 mg via INTRAVENOUS
  Filled 2015-07-29: qty 23

## 2015-07-29 MED ORDER — SODIUM CHLORIDE 0.9 % IJ SOLN
10.0000 mL | INTRAMUSCULAR | Status: DC | PRN
  Administered 2015-07-29: 10 mL
  Filled 2015-07-29: qty 10

## 2015-07-29 MED ORDER — DIPHENHYDRAMINE HCL 25 MG PO CAPS
ORAL_CAPSULE | ORAL | Status: AC
  Filled 2015-07-29: qty 1

## 2015-07-29 MED ORDER — PEGFILGRASTIM 6 MG/0.6ML ~~LOC~~ PSKT
6.0000 mg | PREFILLED_SYRINGE | Freq: Once | SUBCUTANEOUS | Status: AC
  Administered 2015-07-29: 6 mg via SUBCUTANEOUS
  Filled 2015-07-29: qty 0.6

## 2015-07-29 MED ORDER — SODIUM CHLORIDE 0.9 % IV SOLN
Freq: Once | INTRAVENOUS | Status: AC
  Administered 2015-07-29: 12:00:00 via INTRAVENOUS
  Filled 2015-07-29: qty 8

## 2015-07-29 MED ORDER — ACETAMINOPHEN 325 MG PO TABS
650.0000 mg | ORAL_TABLET | Freq: Once | ORAL | Status: AC
  Administered 2015-07-29: 650 mg via ORAL

## 2015-07-29 NOTE — Progress Notes (Signed)
[place]  Telephone:(336) [phone] [phone]) [phone]     ID: Cynthia [name] DOB: August 01, 1966  MR#: [id number]  YYT#:[id number]  Patient Care Team: No Pcp Per Patient as PCP - General (General Practice) Rolm Bookbinder, MD as Consulting Physician (General Surgery) Chauncey Cruel, MD as Consulting Physician (Oncology) Kyung Rudd, MD as Consulting Physician (Radiation Oncology) Mauro Kaufmann, RN as Registered Nurse Rockwell Germany, RN as Registered Nurse PCP: No PCP Per Patient OTHER MD: Christene Slates M.D.  CHIEF COMPLAINT: Triple positive breast cancer  CURRENT TREATMENT: Neoadjuvant chemotherapy   BREAST CANCER HISTORY: From the original intake note:  Cynthia [name] had noted a change in her left breast on self-exam the last week in March. She brought it to Dr. Trellis Paganini attention and on 04/13/2015 underwent diagnostic bilateral mammography and left ultrasonography at [facility]. This found the breast composition to be category D. An area of architectural distortion was noted in the left breast upper outer quadrant correlating to the palpable mass. By ultrasound there was a 2 cm lobulated mass in the left breast upper outer quadrant which was hypoechoic. There was a 4 mm nodule immediately adjacent to this felt to be a satellite lesion.  Biopsy of the left breast mass in question 04/14/2015 showed ([id number]) invasive ductal carcinoma, grade 2, estrogen receptor 90% positive, progesterone receptor 90% positive, with an MIB-1 of 20%, and HER-2 amplified, the signals ratio being 5.36.  The patient's subsequent history is as detailed below  INTERVAL HISTORY: Cynthia [name] returns today for follow up of her breast cancer, accompanied by her mother. Today is day 1, cycle 5 of 6 cycles planned of carboplatin, docetaxel, and trastuzumab. She is taking gabapentin QHS for hot flashes and has recently started her venlafaxine this week, but hasn't been on it long enough to notice if it  helping.  REVIEW OF SYSTEMS: Cynthia [name] denies fevers, chills, or changes in bowel or bladder habits. Her nausea is minimal and usually just mid morning after taking her pills. She is eating and drinking well. She denies mouth sores or neuropathy symptoms. The chronic rash to her arms is still present but well managed with minocycline. She has crust and stickiness around her eyes int he morning, but this is resolved after washing her face. She is more fatigued and stress lately, but plans to take a break from working after chemo is over next month. A detailed review of systems is otherwise stable.  PAST MEDICAL HISTORY: Past Medical History  Diagnosis Date  . 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 Anxiety   . Hot flashes    melanoma, removed in [place]   PAST SURGICAL HISTORY: Past Surgical History  Procedure Laterality Date  . Bladder suspension  2009  . Dnc    . Dilation and curettage of uterus    . Portacath placement Right 04/27/2015    Procedure: INSERTION PORT-A-CATH;  Surgeon: Rolm Bookbinder, MD;  Location: [facility];  Service: General;  Laterality: Right;    FAMILY HISTORY Family History  Problem Relation Age of Onset  . Breast cancer Maternal Aunt   . Lung cancer Maternal Grandmother   . Lung cancer Paternal Grandfather    the patient's parents are still living as of April 2016. They are divorced. The patient is an only child. The patient's paternal grandfather and maternal grandmother had a history of lung cancer. On the mother's side there is a history of melanoma. The patient's mother's only sister  was diagnosed with breast cancer in her 34s. There is no history of ovarian cancer in the family.   GYNECOLOGIC HISTORY:  No LMP recorded.  menarche age 17, first live birth age 67, the patient is GX P3. Her periods are ongoing but irregular. She has a Mirena in place for contraception. She took oral contraceptives for  approximately 15 years in the past with no complications.   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 She is single, and lives at home with her father, who is disabled and has a history of depression. At home also is the patient's [name], 50 years old as of April 2016. The patient's 2 other daughters are [name], lives in [place] and is a physical therapy technician, and [name], lives in [facility] and works in Press photographer.     ADVANCED DIRECTIVES:  not in place.    HEALTH MAINTENANCE: History  Substance Use Topics  . Smoking status: Former Smoker    Types: Cigarettes    Quit date: 04/22/2009  . Smokeless tobacco: Not on file  . Alcohol Use: Yes     Comment: occasion     Colonoscopy: never   PAP:  Bone density: never   Lipid panel:  Allergies  Allergen Reactions  . Zofran [Ondansetron Hcl] Other (See Comments)    headache  . Adhesive [Tape] Rash    Steri strip    Current Outpatient Prescriptions  Medication Sig Dispense Refill  . ALPRAZolam (XANAX) 1 MG tablet     . dexamethasone (DECADRON) 4 MG tablet Take 2 tablets (8 mg total) by mouth 2 (two) times daily. Start the day before Taxotere. Then again the day after chemo for 3 days. 30 tablet 1  . famotidine (PEPCID) 20 MG tablet Take 1 tablet (20 mg total) by mouth 2 (two) times daily. 30 tablet 0  . gabapentin (NEURONTIN) 300 MG capsule Take 1 capsule (300 mg total) by mouth at bedtime. 30 capsule 5  . lidocaine-prilocaine (EMLA) cream Apply to affected area once 30 g 3  . loratadine (CLARITIN) 10 MG tablet Take 1 tablet (10 mg total) by mouth daily. Daily for 3-5 days starting on neulasta injection day 30 tablet 2  . minocycline (MINOCIN) 100 MG capsule Take 1 capsule (100 mg total) by mouth 2 (two) times daily. 100 capsule 3  . Multiple Vitamins-Minerals (CENTRUM SILVER PO) Take by mouth.    Vladimir Faster Glycol-Propyl Glycol (SYSTANE OP) Apply 1 drop to eye 2 (two)  times daily.    . prochlorperazine (COMPAZINE) 10 MG tablet Take 1 tablet (10 mg total) by mouth every 6 (six) hours as needed (Nausea or vomiting). 30 tablet 1  . Venlafaxine HCl 37.5 MG TB24 Take 1 tablet (37.5 mg total) by mouth daily. 30 each 5  . cycloSPORINE (RESTASIS) 0.05 % ophthalmic emulsion Place 1 drop into both eyes 2 (two) times daily. (Patient not taking: Reported on 07/15/2015) 30 mL 3   No current facility-administered medications for this visit.   Facility-Administered Medications Ordered in Other Visits  Medication Dose Route Frequency Provider Last Rate Last Dose  . CARBOplatin (PARAPLATIN) 600 mg in sodium chloride 0.9 % 250 mL chemo infusion  600 mg Intravenous Once Chauncey Cruel, MD      . DOCEtaxel (TAXOTERE) 150 mg in dextrose 5 % 250 mL chemo infusion  75 mg/m2 (Treatment Plan Actual) Intravenous Once Chauncey Cruel, MD 265 mL/hr at 07/29/15 1258 150 mg  at 07/29/15 1258  . heparin lock flush 100 unit/mL  500 Units Intracatheter Once PRN Chauncey Cruel, MD      . pegfilgrastim (NEULASTA ONPRO KIT) injection 6 mg  6 mg Subcutaneous Once Chauncey Cruel, MD      . sodium chloride 0.9 % injection 10 mL  10 mL Intracatheter PRN Chauncey Cruel, MD        OBJECTIVE: middle-aged white woman  Filed Vitals:   07/29/15 1105  BP: 136/76  Pulse: 84  Temp: 98.4 F (36.9 C)  Resp: 18     Body mass index is 27.39 kg/(m^2).    ECOG FS:1 - Symptomatic but completely ambulatory  Sclerae unicteric, pupils round and equal Oropharynx clear and moist-- no thrush or other lesions No cervical or supraclavicular adenopathy Lungs no rales or rhonchi Heart regular rate and rhythm Abd soft, nontender, positive bowel sounds MSK no focal spinal tenderness, no upper extremity lymphedema Neuro: nonfocal, well oriented, appropriate affect Breasts: deferred  LAB RESULTS:  CMP     Component Value Date/Time   NA 143 07/29/2015 1019   K 3.7 07/29/2015 1019   CO2 29  07/29/2015 1019   GLUCOSE 83 07/29/2015 1019   BUN 13.6 07/29/2015 1019   CREATININE 0.8 07/29/2015 1019   CALCIUM 9.3 07/29/2015 1019   PROT 6.2* 07/29/2015 1019   ALBUMIN 3.5 07/29/2015 1019   AST 19 07/29/2015 1019   ALT 25 07/29/2015 1019   ALKPHOS 79 07/29/2015 1019   BILITOT 0.47 07/29/2015 1019    INo results found for: SPEP, UPEP  Lab Results  Component Value Date   WBC 8.1 07/29/2015   NEUTROABS 5.2 07/29/2015   HGB 12.1 07/29/2015   HCT 35.7 07/29/2015   MCV 103.9* 07/29/2015   PLT 156 07/29/2015      Chemistry      Component Value Date/Time   NA 143 07/29/2015 1019   K 3.7 07/29/2015 1019   CO2 29 07/29/2015 1019   BUN 13.6 07/29/2015 1019   CREATININE 0.8 07/29/2015 1019      Component Value Date/Time   CALCIUM 9.3 07/29/2015 1019   ALKPHOS 79 07/29/2015 1019   AST 19 07/29/2015 1019   ALT 25 07/29/2015 1019   BILITOT 0.47 07/29/2015 1019       No results found for: LABCA2  No components found for: LABCA125  No results for input(s): INR in the last 168 hours.  Urinalysis No results found for: COLORURINE, APPEARANCEUR, LABSPEC, PHURINE, GLUCOSEU, HGBUR, BILIRUBINUR, KETONESUR, PROTEINUR, UROBILINOGEN, NITRITE, LEUKOCYTESUR  STUDIES: No results found.  ASSESSMENT: 49 y.o. [place] woman status post left breast biopsy 04/14/2015 for a clinical T2 N0, stage IIA invasive ductal carcinoma, grade 2, estrogen and progesterone receptor positive, with HER-2 amplified, and an MIB-1 of 20%  (1). Neoadjuvant chemotherapy consisting of carboplatin, docetaxel, trastuzumab and pertuzumab every 21 days 6, with onpro support, satrted 05/06/2015  (a) pertuzumab held after 1st cycle because of rash  (2) trastuzumab to be continued to total one year  (a) most recent echo 04/30/2015 shows EF 55-60%  (3) surgery to follow chemotherapy  (4) radiation to follow surgery  (5) anti-estrogens to follow radiation  (6) genetics testing pending  PLAN: Cynthia [name]  looks and feels well today. The labs were reviewed in detail and were entirely stable. She will proceed with cycle 5 of carboplatin, docetaxel, and trastuzumab as planned today. She will be due for an echocardiogram this month, so I have placed orders for this  to be performed soon.  Looking ahead at the schedule, Aliegha would like to have her surgery after the week of 9/18 because she is planning a trip to [place] to visit her daughter and her boyfriend. She will have the MRI and potentially her consult visit with Dr. Donne Hazel prior to leaving.  Natajah will return in 1 week for labs and a nadir visit. She understands and agrees with this plan. She knows the goal of treatment in her case is cure. She has been encouraged to call with any issues that might arise before her next visit here.   Laurie Panda, NP   07/29/2015 1:26 PM

## 2015-07-29 NOTE — Telephone Encounter (Signed)
[facility] needs to call [facility] before they will make an appointment ans patient is aware,avs printed for patient

## 2015-07-29 NOTE — Patient Instructions (Signed)
[facility] Discharge Instructions for Patients Receiving Chemotherapy  Today you received the following chemotherapy agents: Taxotere, Herceptin, Carboplatin  To help prevent nausea and vomiting after your treatment, we encourage you to take your nausea medication as prescribed by your physician.   If you develop nausea and vomiting that is not controlled by your nausea medication, call the clinic.   BELOW ARE SYMPTOMS THAT SHOULD BE REPORTED IMMEDIATELY:  *FEVER GREATER THAN 100.5 F  *CHILLS WITH OR WITHOUT FEVER  NAUSEA AND VOMITING THAT IS NOT CONTROLLED WITH YOUR NAUSEA MEDICATION  *UNUSUAL SHORTNESS OF BREATH  *UNUSUAL BRUISING OR BLEEDING  TENDERNESS IN MOUTH AND THROAT WITH OR WITHOUT PRESENCE OF ULCERS  *URINARY PROBLEMS  *BOWEL PROBLEMS  UNUSUAL RASH Items with * indicate a potential emergency and should be followed up as soon as possible.  Feel free to call the clinic you have any questions or concerns. The clinic phone number is [phone].  Please show the [facility] at check-in to the Emergency Department and triage nurse.

## 2015-08-01 ENCOUNTER — Other Ambulatory Visit: Payer: Self-pay | Admitting: Nurse Practitioner

## 2015-08-02 ENCOUNTER — Other Ambulatory Visit: Payer: Self-pay | Admitting: *Deleted

## 2015-08-02 MED ORDER — LORATADINE 10 MG PO TABS: 10.0000 mg | ORAL_TABLET | Freq: Every day | ORAL | Status: DC

## 2015-08-05 ENCOUNTER — Other Ambulatory Visit (HOSPITAL_BASED_OUTPATIENT_CLINIC_OR_DEPARTMENT_OTHER): Payer: BLUE CROSS/BLUE SHIELD

## 2015-08-05 ENCOUNTER — Telehealth: Payer: Self-pay | Admitting: Nurse Practitioner

## 2015-08-05 ENCOUNTER — Ambulatory Visit (HOSPITAL_BASED_OUTPATIENT_CLINIC_OR_DEPARTMENT_OTHER): Payer: BLUE CROSS/BLUE SHIELD | Admitting: Nurse Practitioner

## 2015-08-05 ENCOUNTER — Encounter: Payer: Self-pay | Admitting: Nurse Practitioner

## 2015-08-05 VITALS — BP 131/79 | HR 77 | Temp 98.7°F | Resp 18 | Ht 68.0 in | Wt 177.6 lb

## 2015-08-05 DIAGNOSIS — R300 Dysuria: Secondary | ICD-10-CM

## 2015-08-05 DIAGNOSIS — C50412 Malignant neoplasm of upper-outer quadrant of left female breast: Secondary | ICD-10-CM

## 2015-08-05 DIAGNOSIS — R232 Flushing: Secondary | ICD-10-CM

## 2015-08-05 DIAGNOSIS — R110 Nausea: Secondary | ICD-10-CM | POA: Diagnosis not present

## 2015-08-05 DIAGNOSIS — G43009 Migraine without aura, not intractable, without status migrainosus: Secondary | ICD-10-CM

## 2015-08-05 DIAGNOSIS — H04209 Unspecified epiphora, unspecified lacrimal gland: Secondary | ICD-10-CM | POA: Insufficient documentation

## 2015-08-05 DIAGNOSIS — N951 Menopausal and female climacteric states: Secondary | ICD-10-CM

## 2015-08-05 DIAGNOSIS — H04203 Unspecified epiphora, bilateral lacrimal glands: Secondary | ICD-10-CM

## 2015-08-05 LAB — COMPREHENSIVE METABOLIC PANEL (CC13)
ALT: 34 U/L (ref 0–55)
AST: 15 U/L (ref 5–34)
Albumin: 3.5 g/dL (ref 3.5–5.0)
Alkaline Phosphatase: 89 U/L (ref 40–150)
Anion Gap: 5 mEq/L (ref 3–11)
BILIRUBIN TOTAL: 0.34 mg/dL (ref 0.20–1.20)
BUN: 13.1 mg/dL (ref 7.0–26.0)
CO2: 29 mEq/L (ref 22–29)
CREATININE: 0.7 mg/dL (ref 0.6–1.1)
Calcium: 9.1 mg/dL (ref 8.4–10.4)
Chloride: 105 mEq/L (ref 98–109)
EGFR: >90 mL/min/{1.73_m2} (ref >90)
Glucose: 101 mg/dl (ref 70–140)
Potassium: 4.1 mEq/L (ref 3.5–5.1)
Sodium: 139 mEq/L (ref 136–145)
Total Protein: 6 g/dL — ABNORMAL LOW (ref 6.4–8.3)

## 2015-08-05 LAB — CBC WITH DIFFERENTIAL/PLATELET
BASO%: 0.5 % (ref 0.0–2.0)
Basophils Absolute: 0 10*3/uL (ref 0.0–0.1)
EOS%: 0.4 % (ref 0.0–7.0)
Eosinophils Absolute: 0 10*3/uL (ref 0.0–0.5)
HEMATOCRIT: 33.6 % — AB (ref 34.8–46.6)
HGB: 11.3 g/dL — ABNORMAL LOW (ref 11.6–15.9)
LYMPH%: 34.8 % (ref 14.0–49.7)
MCH: 35.7 pg — ABNORMAL HIGH (ref 25.1–34.0)
MCHC: 33.8 g/dL (ref 31.5–36.0)
MCV: 105.8 fL — AB (ref 79.5–101.0)
MONO#: 0.6 10*3/uL (ref 0.1–0.9)
MONO%: 11.5 % (ref 0.0–14.0)
NEUT#: 2.7 10*3/uL (ref 1.5–6.5)
NEUT%: 52.8 % (ref 38.4–76.8)
PLATELETS: 94 10*3/uL — AB (ref 145–400)
RBC: 3.17 10*6/uL — ABNORMAL LOW (ref 3.70–5.45)
RDW: 16 % — ABNORMAL HIGH (ref 11.2–14.5)
WBC: 5.1 10*3/uL (ref 3.9–10.3)
lymph#: 1.8 10*3/uL (ref 0.9–3.3)

## 2015-08-05 MED ORDER — TOBRAMYCIN-DEXAMETHASONE 0.3-0.1 % OP SUSP: 2.0000 [drp] | OPHTHALMIC | Status: DC

## 2015-08-05 NOTE — Telephone Encounter (Signed)
Echo to precert with linda,patient will get a call from [facility] and patient has information to call dr wakefield about a bill then they will schedule her

## 2015-08-05 NOTE — Progress Notes (Signed)
[place]  Telephone:(336) [phone] [phone]) [phone]     ID: Brianny Soulliere DOB: 1966/01/06  MR#: [id number]  SKA#:[id number]  Patient Care Team: No Pcp Per Patient as PCP - General (General Practice) Rolm Bookbinder, MD as Consulting Physician (General Surgery) Chauncey Cruel, MD as Consulting Physician (Oncology) Kyung Rudd, MD as Consulting Physician (Radiation Oncology) Mauro Kaufmann, RN as Registered Nurse Rockwell Germany, RN as Registered Nurse PCP: No PCP Per Patient OTHER MD: Christene Slates M.D.  CHIEF COMPLAINT: Triple positive breast cancer  CURRENT TREATMENT: Neoadjuvant chemotherapy   BREAST CANCER HISTORY: From the original intake note:  [name] had noted a change in her left breast on self-exam the last week in March. She brought it to Dr. Trellis Paganini attention and on 04/13/2015 underwent diagnostic bilateral mammography and left ultrasonography at [facility]. This found the breast composition to be category D. An area of architectural distortion was noted in the left breast upper outer quadrant correlating to the palpable mass. By ultrasound there was a 2 cm lobulated mass in the left breast upper outer quadrant which was hypoechoic. There was a 4 mm nodule immediately adjacent to this felt to be a satellite lesion.  Biopsy of the left breast mass in question 04/14/2015 showed ([id number]) invasive ductal carcinoma, grade 2, estrogen receptor 90% positive, progesterone receptor 90% positive, with an MIB-1 of 20%, and HER-2 amplified, the signals ratio being 5.36.  The patient's subsequent history is as detailed below  INTERVAL HISTORY: Walida returns today for follow up of her breast cancer. Today is day 8, cycle 5 of 6 cycles planned of carboplatin, docetaxel, and trastuzumab.   REVIEW OF SYSTEMS: Elizeth denies fevers, chills, or changes in bowel habits. Her urine has been darker for the past 2 days, but she denies urgency, frequency, or dysuria. She has had  more nausea this cycle, but she is hesitant about taking any more pills than she has to. She is eating and drinking well. She denies mouth sores or neuropathy symptoms. The chronic rash to her arms is still present but well managed with minocycline. Her eyes have been watering more and this is frustrating. Her nighttime hot flashes are better on the gabapentin, but her daytime hot flashes are still intense on the venlafaxine. She had right hand numbness once, but this resolved on its own. She has more chemo brain lately and that frustrates her. She is more fatigued and stress lately, but plans to take a break from working after chemo is over next month. A detailed review of systems is otherwise stable.  PAST MEDICAL HISTORY: Past Medical History  Diagnosis Date  . 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 Anxiety   . Hot flashes    melanoma, removed in [place]   PAST SURGICAL HISTORY: Past Surgical History  Procedure Laterality Date  . Bladder suspension  2009  . Dnc    . Dilation and curettage of uterus    . Portacath placement Right 04/27/2015    Procedure: INSERTION PORT-A-CATH;  Surgeon: Rolm Bookbinder, MD;  Location: [facility];  Service: General;  Laterality: Right;    FAMILY HISTORY Family History  Problem Relation Age of Onset  . Breast cancer Maternal Aunt   . Lung cancer Maternal Grandmother   . Lung cancer Paternal Grandfather    the patient's parents are still living as of April 2016. They are divorced. The patient is an only child. The patient's paternal grandfather  and maternal grandmother had a history of lung cancer. On the mother's side there is a history of melanoma. The patient's mother's only sister was diagnosed with breast cancer in her 47s. There is no history of ovarian cancer in the family.   GYNECOLOGIC HISTORY:  No LMP recorded.  menarche age 74, first live birth age 35, the patient is GX P3. Her periods are  ongoing but irregular. She has a Mirena in place for contraception. She took oral contraceptives for approximately 15 years in the past with no complications.   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 She is single, and lives at home with her father, who is disabled and has a history of depression. At home also is the patient's [name], 50 years old as of April 2016. The patient's 2 other daughters are [name], lives in [place] and is a physical therapy technician, and [name], lives in [facility] and works in Press photographer.     ADVANCED DIRECTIVES:  not in place.    HEALTH MAINTENANCE: Social History  Substance Use Topics  . Smoking status: Former Smoker    Types: Cigarettes    Quit date: 04/22/2009  . Smokeless tobacco: Not on file  . Alcohol Use: Yes     Comment: occasion     Colonoscopy: never   PAP:  Bone density: never   Lipid panel:  Allergies  Allergen Reactions  . Zofran [Ondansetron Hcl] Other (See Comments)    headache  . Adhesive [Tape] Rash    Steri strip    Current Outpatient Prescriptions  Medication Sig Dispense Refill  . ALPRAZolam (XANAX) 1 MG tablet     . dexamethasone (DECADRON) 4 MG tablet Take 2 tablets (8 mg total) by mouth 2 (two) times daily. Start the day before Taxotere. Then again the day after chemo for 3 days. 30 tablet 1  . famotidine (PEPCID) 20 MG tablet Take 1 tablet (20 mg total) by mouth 2 (two) times daily. 30 tablet 0  . gabapentin (NEURONTIN) 300 MG capsule Take 1 capsule (300 mg total) by mouth at bedtime. 30 capsule 5  . lidocaine-prilocaine (EMLA) cream Apply to affected area once 30 g 3  . loratadine (CLARITIN) 10 MG tablet Take 1 tablet (10 mg total) by mouth daily. Daily for 3-5 days starting on neulasta injection day 30 tablet 2  . minocycline (MINOCIN) 100 MG capsule Take 1 capsule (100 mg total) by mouth 2 (two) times daily. 100 capsule 3  . Multiple Vitamins-Minerals  (CENTRUM SILVER PO) Take by mouth.    Vladimir Faster Glycol-Propyl Glycol (SYSTANE OP) Apply 1 drop to eye 2 (two) times daily.    . Venlafaxine HCl 37.5 MG TB24 Take 1 tablet (37.5 mg total) by mouth daily. 30 each 5  . cycloSPORINE (RESTASIS) 0.05 % ophthalmic emulsion Place 1 drop into both eyes 2 (two) times daily. (Patient not taking: Reported on 07/15/2015) 30 mL 3  . prochlorperazine (COMPAZINE) 10 MG tablet Take 1 tablet (10 mg total) by mouth every 6 (six) hours as needed (Nausea or vomiting). (Patient not taking: Reported on 08/05/2015) 30 tablet 1  . tobramycin-dexamethasone (TOBRADEX) ophthalmic solution Place 2 drops into both eyes every 4 (four) hours while awake. 5 mL 0   No current facility-administered medications for this visit.    OBJECTIVE: middle-aged white woman  Filed Vitals:   08/05/15 1109  BP: 131/79  Pulse: 77  Temp: 98.7 F (37.1 C)  Resp: 18     Body mass index is 27.01 kg/(m^2).    ECOG FS:1 - Symptomatic but completely ambulatory  Skin: warm, dry  HEENT: sclerae anicteric, conjunctivae pink, oropharynx clear. No thrush or mucositis.  Lymph Nodes: No cervical or supraclavicular lymphadenopathy  Lungs: clear to auscultation bilaterally, no rales, wheezes, or rhonci  Heart: regular rate and rhythm  Abdomen: round, soft, non tender, positive bowel sounds  Musculoskeletal: No focal spinal tenderness, no peripheral edema  Neuro: non focal, well oriented, positive affect  Breasts: deferred  LAB RESULTS:  CMP     Component Value Date/Time   NA 139 08/05/2015 1055   K 4.1 08/05/2015 1055   CO2 29 08/05/2015 1055   GLUCOSE 101 08/05/2015 1055   BUN 13.1 08/05/2015 1055   CREATININE 0.7 08/05/2015 1055   CALCIUM 9.1 08/05/2015 1055   PROT 6.0* 08/05/2015 1055   ALBUMIN 3.5 08/05/2015 1055   AST 15 08/05/2015 1055   ALT 34 08/05/2015 1055   ALKPHOS 89 08/05/2015 1055   BILITOT 0.34 08/05/2015 1055    INo results found for: SPEP, UPEP  Lab Results    Component Value Date   WBC 5.1 08/05/2015   NEUTROABS 2.7 08/05/2015   HGB 11.3* 08/05/2015   HCT 33.6* 08/05/2015   MCV 105.8* 08/05/2015   PLT 94* 08/05/2015      Chemistry      Component Value Date/Time   NA 139 08/05/2015 1055   K 4.1 08/05/2015 1055   CO2 29 08/05/2015 1055   BUN 13.1 08/05/2015 1055   CREATININE 0.7 08/05/2015 1055      Component Value Date/Time   CALCIUM 9.1 08/05/2015 1055   ALKPHOS 89 08/05/2015 1055   AST 15 08/05/2015 1055   ALT 34 08/05/2015 1055   BILITOT 0.34 08/05/2015 1055       No results found for: LABCA2  No components found for: LABCA125  No results for input(s): INR in the last 168 hours.  Urinalysis No results found for: COLORURINE, APPEARANCEUR, LABSPEC, PHURINE, GLUCOSEU, HGBUR, BILIRUBINUR, KETONESUR, PROTEINUR, UROBILINOGEN, NITRITE, LEUKOCYTESUR  STUDIES: No results found.  ASSESSMENT: 49 y.o. [place] woman status post left breast biopsy 04/14/2015 for a clinical T2 N0, stage IIA invasive ductal carcinoma, grade 2, estrogen and progesterone receptor positive, with HER-2 amplified, and an MIB-1 of 20%  (1). Neoadjuvant chemotherapy consisting of carboplatin, docetaxel, trastuzumab and pertuzumab every 21 days 6, with onpro support, satrted 05/06/2015  (a) pertuzumab held after 1st cycle because of rash  (2) trastuzumab to be continued to total one year  (a) most recent echo 04/30/2015 shows EF 55-60%  (3) surgery to follow chemotherapy  (4) radiation to follow surgery  (5) anti-estrogens to follow radiation  (6) genetics testing pending  PLAN: Shaquita is becoming weary but recognizes she is almost at the end of her treatments. The labs were reviewed in detail and were entirely stable. She will be due for an echocardiogram this month, so I have placed orders for this to be performed soon.  We will try tobradex for her excessive tearing. Restasis made her eyes burn previously. She will increase her dose of  venlafaxine to 36m using 2 of the 37.527mtablets for the next few weeks to see if this decreases her daytime hot flashes.   LiNyieshaould like to have her surgery after the week of 9/18 because she [place] and her boyfriend. She will have the MRI and  potentially her consult visit with Dr. Donne Hazel prior to leaving.   Gwendolyne will return in 2 weeks for her 6th and final cycle of treatment. She understands and agrees with this plan. She knows the goal of treatment in her case is cure. She has been encouraged to call with any issues that might arise before her next visit here.   Laurie Panda, NP   08/05/2015 11:56 AM

## 2015-08-13 ENCOUNTER — Other Ambulatory Visit: Payer: Self-pay | Admitting: *Deleted

## 2015-08-13 MED ORDER — VENLAFAXINE HCL ER 75 MG PO CP24: 75.0000 mg | ORAL_CAPSULE | Freq: Every day | ORAL | Status: DC

## 2015-08-18 ENCOUNTER — Encounter: Payer: Self-pay | Admitting: Pharmacist

## 2015-08-19 ENCOUNTER — Other Ambulatory Visit (HOSPITAL_BASED_OUTPATIENT_CLINIC_OR_DEPARTMENT_OTHER): Payer: BLUE CROSS/BLUE SHIELD

## 2015-08-19 ENCOUNTER — Ambulatory Visit (HOSPITAL_BASED_OUTPATIENT_CLINIC_OR_DEPARTMENT_OTHER): Payer: BLUE CROSS/BLUE SHIELD | Admitting: Nurse Practitioner

## 2015-08-19 ENCOUNTER — Telehealth: Payer: Self-pay | Admitting: Oncology

## 2015-08-19 ENCOUNTER — Ambulatory Visit (HOSPITAL_BASED_OUTPATIENT_CLINIC_OR_DEPARTMENT_OTHER): Payer: BLUE CROSS/BLUE SHIELD

## 2015-08-19 ENCOUNTER — Other Ambulatory Visit: Payer: Self-pay | Admitting: Oncology

## 2015-08-19 ENCOUNTER — Encounter: Payer: Self-pay | Admitting: *Deleted

## 2015-08-19 ENCOUNTER — Encounter: Payer: Self-pay | Admitting: Nurse Practitioner

## 2015-08-19 VITALS — BP 133/75 | HR 100 | Temp 97.9°F | Resp 18 | Ht 68.0 in | Wt 174.6 lb

## 2015-08-19 DIAGNOSIS — C50412 Malignant neoplasm of upper-outer quadrant of left female breast: Secondary | ICD-10-CM | POA: Diagnosis not present

## 2015-08-19 DIAGNOSIS — Z5112 Encounter for antineoplastic immunotherapy: Secondary | ICD-10-CM | POA: Diagnosis not present

## 2015-08-19 DIAGNOSIS — Z5111 Encounter for antineoplastic chemotherapy: Secondary | ICD-10-CM | POA: Diagnosis not present

## 2015-08-19 DIAGNOSIS — G43009 Migraine without aura, not intractable, without status migrainosus: Secondary | ICD-10-CM

## 2015-08-19 LAB — COMPREHENSIVE METABOLIC PANEL (CC13)
ALT: 24 U/L (ref 0–55)
ANION GAP: 10 meq/L (ref 3–11)
AST: 17 U/L (ref 5–34)
Albumin: 3.6 g/dL (ref 3.5–5.0)
Alkaline Phosphatase: 85 U/L (ref 40–150)
BUN: 12.6 mg/dL (ref 7.0–26.0)
CHLORIDE: 108 meq/L (ref 98–109)
CO2: 26 meq/L (ref 22–29)
Calcium: 9.5 mg/dL (ref 8.4–10.4)
Creatinine: 0.8 mg/dL (ref 0.6–1.1)
Glucose: 87 mg/dl (ref 70–140)
Potassium: 4.2 mEq/L (ref 3.5–5.1)
Sodium: 144 mEq/L (ref 136–145)
Total Bilirubin: 0.32 mg/dL (ref 0.20–1.20)
Total Protein: 6.3 g/dL — ABNORMAL LOW (ref 6.4–8.3)

## 2015-08-19 LAB — CBC WITH DIFFERENTIAL/PLATELET
BASO%: 0 % (ref 0.0–2.0)
BASOS ABS: 0 10*3/uL (ref 0.0–0.1)
EOS ABS: 0 10*3/uL (ref 0.0–0.5)
EOS%: 0 % (ref 0.0–7.0)
HCT: 34.8 % (ref 34.8–46.6)
HEMOGLOBIN: 11.7 g/dL (ref 11.6–15.9)
LYMPH#: 1.7 10*3/uL (ref 0.9–3.3)
LYMPH%: 20.6 % (ref 14.0–49.7)
MCH: 36.1 pg — AB (ref 25.1–34.0)
MCHC: 33.6 g/dL (ref 31.5–36.0)
MCV: 107.4 fL — AB (ref 79.5–101.0)
MONO#: 1 10*3/uL — ABNORMAL HIGH (ref 0.1–0.9)
MONO%: 12.7 % (ref 0.0–14.0)
NEUT%: 66.7 % (ref 38.4–76.8)
NEUTROS ABS: 5.4 10*3/uL (ref 1.5–6.5)
Platelets: 173 10*3/uL (ref 145–400)
RBC: 3.24 10*6/uL — ABNORMAL LOW (ref 3.70–5.45)
RDW: 15.3 % — AB (ref 11.2–14.5)
WBC: 8 10*3/uL (ref 3.9–10.3)

## 2015-08-19 MED ORDER — GABAPENTIN 300 MG PO CAPS: 300.0000 mg | ORAL_CAPSULE | Freq: Every day | ORAL | Status: DC

## 2015-08-19 MED ORDER — ACETAMINOPHEN 325 MG PO TABS
650.0000 mg | ORAL_TABLET | Freq: Once | ORAL | Status: AC
  Administered 2015-08-19: 650 mg via ORAL

## 2015-08-19 MED ORDER — SODIUM CHLORIDE 0.9 % IJ SOLN
10.0000 mL | INTRAMUSCULAR | Status: DC | PRN
  Administered 2015-08-19: 10 mL
  Filled 2015-08-19: qty 10

## 2015-08-19 MED ORDER — HEPARIN SOD (PORK) LOCK FLUSH 100 UNIT/ML IV SOLN
500.0000 [IU] | Freq: Once | INTRAVENOUS | Status: AC | PRN
  Administered 2015-08-19: 500 [IU]
  Filled 2015-08-19: qty 5

## 2015-08-19 MED ORDER — DIPHENHYDRAMINE HCL 25 MG PO CAPS
25.0000 mg | ORAL_CAPSULE | Freq: Once | ORAL | Status: AC
  Administered 2015-08-19: 25 mg via ORAL

## 2015-08-19 MED ORDER — SODIUM CHLORIDE 0.9 % IV SOLN
Freq: Once | INTRAVENOUS | Status: AC
  Administered 2015-08-19: 13:00:00 via INTRAVENOUS
  Filled 2015-08-19: qty 8

## 2015-08-19 MED ORDER — TRASTUZUMAB CHEMO INJECTION 440 MG
6.0000 mg/kg | Freq: Once | INTRAVENOUS | Status: AC
  Administered 2015-08-19: 483 mg via INTRAVENOUS
  Filled 2015-08-19: qty 23

## 2015-08-19 MED ORDER — SODIUM CHLORIDE 0.9 % IV SOLN
Freq: Once | INTRAVENOUS | Status: AC
  Administered 2015-08-19: 12:00:00 via INTRAVENOUS

## 2015-08-19 MED ORDER — SODIUM CHLORIDE 0.9 % IV SOLN
600.0000 mg | Freq: Once | INTRAVENOUS | Status: AC
  Administered 2015-08-19: 600 mg via INTRAVENOUS
  Filled 2015-08-19: qty 60

## 2015-08-19 MED ORDER — DEXTROSE 5 % IV SOLN
75.0000 mg/m2 | Freq: Once | INTRAVENOUS | Status: AC
  Administered 2015-08-19: 150 mg via INTRAVENOUS
  Filled 2015-08-19: qty 15

## 2015-08-19 MED ORDER — ACETAMINOPHEN 325 MG PO TABS
ORAL_TABLET | ORAL | Status: AC
  Filled 2015-08-19: qty 2

## 2015-08-19 MED ORDER — PEGFILGRASTIM 6 MG/0.6ML ~~LOC~~ PSKT
6.0000 mg | PREFILLED_SYRINGE | Freq: Once | SUBCUTANEOUS | Status: AC
  Administered 2015-08-19: 6 mg via SUBCUTANEOUS
  Filled 2015-08-19: qty 0.6

## 2015-08-19 MED ORDER — DIPHENHYDRAMINE HCL 25 MG PO CAPS
ORAL_CAPSULE | ORAL | Status: AC
Start: 2015-08-19 — End: 2015-08-19
  Filled 2015-08-19: qty 1

## 2015-08-19 NOTE — Telephone Encounter (Signed)
Gave and printed appt sched and avs for pt for Aug thru NOV °

## 2015-08-19 NOTE — Patient Instructions (Signed)
Carboplatin injection What is this medicine? CARBOPLATIN (KAR boe pla tin) is a chemotherapy drug. It targets fast dividing cells, like cancer cells, and causes these cells to die. This medicine is used to treat ovarian cancer and many other cancers. This medicine may be used for other purposes; ask your health care provider or pharmacist if you have questions. COMMON BRAND NAME(S): Paraplatin What should I tell my health care provider before I take this medicine? They need to know if you have any of these conditions: -blood disorders -hearing problems -kidney disease -recent or ongoing radiation therapy -an unusual or allergic reaction to carboplatin, cisplatin, other chemotherapy, other medicines, foods, dyes, or preservatives -pregnant or trying to get pregnant -breast-feeding How should I use this medicine? This drug is usually given as an infusion into a vein. It is administered in a hospital or clinic by a specially trained health care professional. Talk to your pediatrician regarding the use of this medicine in children. Special care may be needed. Overdosage: If you think you have taken too much of this medicine contact a poison control center or emergency room at once. NOTE: This medicine is only for you. Do not share this medicine with others. What if I miss a dose? It is important not to miss a dose. Call your doctor or health care professional if you are unable to keep an appointment. What may interact with this medicine? -medicines for seizures -medicines to increase blood counts like filgrastim, pegfilgrastim, sargramostim -some antibiotics like amikacin, gentamicin, neomycin, streptomycin, tobramycin -vaccines Talk to your doctor or health care professional before taking any of these medicines: -acetaminophen -aspirin -ibuprofen -ketoprofen -naproxen This list may not describe all possible interactions. Give your health care provider a list of all the medicines, herbs,  non-prescription drugs, or dietary supplements you use. Also tell them if you smoke, drink alcohol, or use illegal drugs. Some items may interact with your medicine. What should I watch for while using this medicine? Your condition will be monitored carefully while you are receiving this medicine. You will need important blood work done while you are taking this medicine. This drug may make you feel generally unwell. This is not uncommon, as chemotherapy can affect healthy cells as well as cancer cells. Report any side effects. Continue your course of treatment even though you feel ill unless your doctor tells you to stop. In some cases, you may be given additional medicines to help with side effects. Follow all directions for their use. Call your doctor or health care professional for advice if you get a fever, chills or sore throat, or other symptoms of a cold or flu. Do not treat yourself. This drug decreases your body's ability to fight infections. Try to avoid being around people who are sick. This medicine may increase your risk to bruise or bleed. Call your doctor or health care professional if you notice any unusual bleeding. Be careful brushing and flossing your teeth or using a toothpick because you may get an infection or bleed more easily. If you have any dental work done, tell your dentist you are receiving this medicine. Avoid taking products that contain aspirin, acetaminophen, ibuprofen, naproxen, or ketoprofen unless instructed by your doctor. These medicines may hide a fever. Do not become pregnant while taking this medicine. Women should inform their doctor if they wish to become pregnant or think they might be pregnant. There is a potential for serious side effects to an unborn child. Talk to your health care professional or  pharmacist for more information. Do not breast-feed an infant while taking this medicine. What side effects may I notice from receiving this medicine? Side effects  that you should report to your doctor or health care professional as soon as possible: -allergic reactions like skin rash, itching or hives, swelling of the face, lips, or tongue -signs of infection - fever or chills, cough, sore throat, pain or difficulty passing urine -signs of decreased platelets or bleeding - bruising, pinpoint red spots on the skin, black, tarry stools, nosebleeds -signs of decreased red blood cells - unusually weak or tired, fainting spells, lightheadedness -breathing problems -changes in hearing -changes in vision -chest pain -high blood pressure -low blood counts - This drug may decrease the number of white blood cells, red blood cells and platelets. You may be at increased risk for infections and bleeding. -nausea and vomiting -pain, swelling, redness or irritation at the injection site -pain, tingling, numbness in the hands or feet -problems with balance, talking, walking -trouble passing urine or change in the amount of urine Side effects that usually do not require medical attention (report to your doctor or health care professional if they continue or are bothersome): -hair loss -loss of appetite -metallic taste in the mouth or changes in taste This list may not describe all possible side effects. Call your doctor for medical advice about side effects. You may report side effects to FDA at 1-800-FDA-1088. Where should I keep my medicine? This drug is given in a hospital or clinic and will not be stored at home. NOTE: This sheet is a summary. It may not cover all possible information. If you have questions about this medicine, talk to your doctor, pharmacist, or health care provider.  2015, [employer] Standard. (2008-03-17 14:38:05) Docetaxel injection What is this medicine? DOCETAXEL (doe se TAX el) is a chemotherapy drug. It targets fast dividing cells, like cancer cells, and causes these cells to die. This medicine is used to treat many types of cancers  like breast cancer, certain stomach cancers, head and neck cancer, lung cancer, and prostate cancer. This medicine may be used for other purposes; ask your health care provider or pharmacist if you have questions. COMMON BRAND NAME(S): Docefrez, Taxotere What should I tell my health care provider before I take this medicine? They need to know if you have any of these conditions: -infection (especially a virus infection such as chickenpox, cold sores, or herpes) -liver disease -low blood counts, like low white cell, platelet, or red cell counts -an unusual or allergic reaction to docetaxel, polysorbate 80, other chemotherapy agents, other medicines, foods, dyes, or preservatives -pregnant or trying to get pregnant -breast-feeding How should I use this medicine? This drug is given as an infusion into a vein. It is administered in a hospital or clinic by a specially trained health care professional. Talk to your pediatrician regarding the use of this medicine in children. Special care may be needed. Overdosage: If you think you have taken too much of this medicine contact a poison control center or emergency room at once. NOTE: This medicine is only for you. Do not share this medicine with others. What if I miss a dose? It is important not to miss your dose. Call your doctor or health care professional if you are unable to keep an appointment. What may interact with this medicine? -cyclosporine -erythromycin -ketoconazole -medicines to increase blood counts like filgrastim, pegfilgrastim, sargramostim -vaccines Talk to your doctor or health care professional before taking any of these  medicines: -acetaminophen -aspirin -ibuprofen -ketoprofen -naproxen This list may not describe all possible interactions. Give your health care provider a list of all the medicines, herbs, non-prescription drugs, or dietary supplements you use. Also tell them if you smoke, drink alcohol, or use illegal drugs.  Some items may interact with your medicine. What should I watch for while using this medicine? Your condition will be monitored carefully while you are receiving this medicine. You will need important blood work done while you are taking this medicine. This drug may make you feel generally unwell. This is not uncommon, as chemotherapy can affect healthy cells as well as cancer cells. Report any side effects. Continue your course of treatment even though you feel ill unless your doctor tells you to stop. In some cases, you may be given additional medicines to help with side effects. Follow all directions for their use. Call your doctor or health care professional for advice if you get a fever, chills or sore throat, or other symptoms of a cold or flu. Do not treat yourself. This drug decreases your body's ability to fight infections. Try to avoid being around people who are sick. This medicine may increase your risk to bruise or bleed. Call your doctor or health care professional if you notice any unusual bleeding. Be careful brushing and flossing your teeth or using a toothpick because you may get an infection or bleed more easily. If you have any dental work done, tell your dentist you are receiving this medicine. Avoid taking products that contain aspirin, acetaminophen, ibuprofen, naproxen, or ketoprofen unless instructed by your doctor. These medicines may hide a fever. This medicine contains an alcohol in the product. You may get drowsy or dizzy. Do not drive, use machinery, or do anything that needs mental alertness until you know how this medicine affects you. Do not stand or sit up quickly, especially if you are an older patient. This reduces the risk of dizzy or fainting spells. Avoid alcoholic drinks Do not become pregnant while taking this medicine. Women should inform their doctor if they wish to become pregnant or think they might be pregnant. There is a potential for serious side effects to  an unborn child. Talk to your health care professional or pharmacist for more information. Do not breast-feed an infant while taking this medicine. What side effects may I notice from receiving this medicine? Side effects that you should report to your doctor or health care professional as soon as possible: -allergic reactions like skin rash, itching or hives, swelling of the face, lips, or tongue -low blood counts - This drug may decrease the number of white blood cells, red blood cells and platelets. You may be at increased risk for infections and bleeding. -signs of infection - fever or chills, cough, sore throat, pain or difficulty passing urine -signs of decreased platelets or bleeding - bruising, pinpoint red spots on the skin, black, tarry stools, nosebleeds -signs of decreased red blood cells - unusually weak or tired, fainting spells, lightheadedness -breathing problems -fast or irregular heartbeat -low blood pressure -mouth sores -nausea and vomiting -pain, swelling, redness or irritation at the injection site -pain, tingling, numbness in the hands or feet -swelling of the ankle, feet, hands -weight gain Side effects that usually do not require medical attention (report to your prescriber or health care professional if they continue or are bothersome): -bone pain -complete hair loss including hair on your head, underarms, pubic hair, eyebrows, and eyelashes -diarrhea -excessive tearing -changes in the  color of fingernails -loosening of the fingernails -nausea -muscle pain -red flush to skin -sweating -weak or tired This list may not describe all possible side effects. Call your doctor for medical advice about side effects. You may report side effects to FDA at 1-800-FDA-1088. Where should I keep my medicine? This drug is given in a hospital or clinic and will not be stored at home. NOTE: This sheet is a summary. It may not cover all possible information. If you have  questions about this medicine, talk to your doctor, pharmacist, or health care provider.  2015, [employer] Standard. (2013-11-06 22:21:02) Trastuzumab injection for infusion What is this medicine? TRASTUZUMAB (tras TOO zoo mab) is a monoclonal antibody. It targets a protein called HER2. This protein is found in some stomach and breast cancers. This medicine can stop cancer cell growth. This medicine may be used with other cancer treatments. This medicine may be used for other purposes; ask your health care provider or pharmacist if you have questions. COMMON BRAND NAME(S): Herceptin What should I tell my health care provider before I take this medicine? They need to know if you have any of these conditions: -heart disease -heart failure -infection (especially a virus infection such as chickenpox, cold sores, or herpes) -lung or breathing disease, like asthma -recent or ongoing radiation therapy -an unusual or allergic reaction to trastuzumab, benzyl alcohol, or other medications, foods, dyes, or preservatives -pregnant or trying to get pregnant -breast-feeding How should I use this medicine? This drug is given as an infusion into a vein. It is administered in a hospital or clinic by a specially trained health care professional. Talk to your pediatrician regarding the use of this medicine in children. This medicine is not approved for use in children. Overdosage: If you think you have taken too much of this medicine contact a poison control center or emergency room at once. NOTE: This medicine is only for you. Do not share this medicine with others. What if I miss a dose? It is important not to miss a dose. Call your doctor or health care professional if you are unable to keep an appointment. What may interact with this medicine? -cyclophosphamide -doxorubicin -warfarin This list may not describe all possible interactions. Give your health care provider a list of all the medicines,  herbs, non-prescription drugs, or dietary supplements you use. Also tell them if you smoke, drink alcohol, or use illegal drugs. Some items may interact with your medicine. What should I watch for while using this medicine? Visit your doctor for checks on your progress. Report any side effects. Continue your course of treatment even though you feel ill unless your doctor tells you to stop. Call your doctor or health care professional for advice if you get a fever, chills or sore throat, or other symptoms of a cold or flu. Do not treat yourself. Try to avoid being around people who are sick. You may experience fever, chills and shaking during your first infusion. These effects are usually mild and can be treated with other medicines. Report any side effects during the infusion to your health care professional. Fever and chills usually do not happen with later infusions. What side effects may I notice from receiving this medicine? Side effects that you should report to your doctor or other health care professional as soon as possible: -breathing difficulties -chest pain or palpitations -cough -dizziness or fainting -fever or chills, sore throat -skin rash, itching or hives -swelling of the legs or ankles -unusually weak  or tired Side effects that usually do not require medical attention (report to your doctor or other health care professional if they continue or are bothersome): -loss of appetite -headache -muscle aches -nausea This list may not describe all possible side effects. Call your doctor for medical advice about side effects. You may report side effects to FDA at 1-800-FDA-1088. Where should I keep my medicine? This drug is given in a hospital or clinic and will not be stored at home. NOTE: This sheet is a summary. It may not cover all possible information. If you have questions about this medicine, talk to your doctor, pharmacist, or health care provider.  2015, [employer]  Standard. (2009-10-15 13:43:15)

## 2015-08-19 NOTE — Progress Notes (Signed)
[place]  [phone] Fax:([phone]) [phone]     ID: Kendrea Cerritos DOB: [birth year]  MR#: [id number]  TEL#:[id number]  Patient Care Team: No Pcp Per Patient as PCP - General (General Practice) Rolm Bookbinder, MD as Consulting Physician (General Surgery) Chauncey Cruel, MD as Consulting Physician (Oncology) Kyung Rudd, MD as Consulting Physician (Radiation Oncology) Mauro Kaufmann, RN as Registered Nurse Rockwell Germany, RN as Registered Nurse PCP: No PCP Per Patient OTHER MD: Christene Slates M.D.  CHIEF COMPLAINT: Triple positive breast cancer  CURRENT TREATMENT: Neoadjuvant chemotherapy   BREAST CANCER HISTORY: From the original intake note:  [name] had noted a change in her left breast on self-exam the last week in March. She brought it to Dr. Trellis Paganini attention and on 04/13/2015 underwent diagnostic bilateral mammography and left ultrason[facility] at [facility]. This found the breast composition to be category D. An area of architectural distortion was noted in the left breast upper outer quadrant correlating to the palpable mass. By ultrasound there was a 2 cm lobulated mass in the left breast upper outer quadrant which was hypoechoic. There was a 4 mm nodule immediately adjacent to this felt to be a satellite lesion.  Biopsy of the left breast mass in question 04/14/2015 showed ([id number]) invasive ductal carcinoma, grade 2, estrogen receptor 90% positive, progesterone receptor 90% positive, with an MIB-1 of 20%, and HER-2 amplified, the signals ratio being 5.36.  The patient's subsequent history is as detailed below  INTERVAL HISTORY: Litha returns today for follow up of her breast cancer, accompanied by her youngest daughter. Today is day 1, cycle 6 of 6 cycles planned of carboplatin, docetaxel, and trastuzumab.   REVIEW OF SYSTEMS: Aryannah denies fevers, chills, nausea or vomiting. She has had some mild constipation, but had a normal bowel movement this  morning. She is eating and drinking well. She denies mouth sores or neuropathy symptoms. She wakes up to tingling in her right arm, but believes this is due to how she sleeps on it. The swelling to her ankles has resolved. Her hot flashes are generally controlled during the day on 5m venlafaxine daily, but towards late afternoon into the night they resurge. She is using 3069mgabapentin QHS. A detailed review of systems is otherwise stable.   PAST MEDICAL HISTORY: Past Medical History  Diagnosis Date  . 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 Anxiety   . Hot flashes    [place]ST SURGICAL HISTORY: Past Surgical History  Procedure Laterality Date  . Bladder suspension  2009  . Dnc    . Dilation and curettage of uterus    . Portacath placement Right 04/27/2015    Procedure: INSERTION PORT-A-CATH;  Surgeon: MaRolm BookbinderMD;  Location: [facility] Service: General;  Laterality: Right;    FAMILY HISTORY Family History  Problem Relation Age of Onset  . Breast cancer Maternal Aunt   . Lung cancer Maternal Grandmother   . Lung cancer Paternal Grandfather    the patient's parents are still living as of April 2016. They are divorced. The patient is an only child. The patient's paternal grandfather and maternal grandmother had a history of lung cancer. On the mother's side there is a history of melanoma. The patient's mother's only sister was diagnosed with breast cancer in her 3033sThere is no history of ovarian cancer in the family.   GYNECOLOGIC HISTORY:  No LMP recorded.  menarche age 46, first live birth age 71, the patient is GX P3. Her periods are ongoing but irregular. She has a Mirena in place for contraception. She took oral contraceptives for approximately 15 years in the past with no complications.   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 She is single, and lives  at home with her father, who is disabled and has a history of depression. At home also is the patient's [name], 50 years old as of April 2016. The patient's 2 other daughters are [name], lives in [place] and is a physical therapy technician, and [name], lives in [place] and works in Press photographer.     ADVANCED DIRECTIVES:  not in place.    HEALTH MAINTENANCE: Social History  Substance Use Topics  . Smoking status: Former Smoker    Types: Cigarettes    Quit date: 04/22/2009  . Smokeless tobacco: Not on file  . Alcohol Use: Yes     Comment: occasion     Colonoscopy: never   PAP:  Bone density: never   Lipid panel:  Allergies  Allergen Reactions  . Zofran [Ondansetron Hcl] Other (See Comments)    headache  . Adhesive [Tape] Rash    Steri strip    Current Outpatient Prescriptions  Medication Sig Dispense Refill  . ALPRAZolam (XANAX) 1 MG tablet     . dexamethasone (DECADRON) 4 MG tablet Take 2 tablets (8 mg total) by mouth 2 (two) times daily. Start the day before Taxotere. Then again the day after chemo for 3 days. 30 tablet 1  . famotidine (PEPCID) 20 MG tablet Take 1 tablet (20 mg total) by mouth 2 (two) times daily. 30 tablet 0  . gabapentin (NEURONTIN) 300 MG capsule Take 1 capsule (300 mg total) by mouth at bedtime. 90 capsule 3  . lidocaine-prilocaine (EMLA) cream Apply to affected area once 30 g 3  . loratadine (CLARITIN) 10 MG tablet Take 1 tablet (10 mg total) by mouth daily. Daily for 3-5 days starting on neulasta injection day 30 tablet 2  . minocycline (MINOCIN) 100 MG capsule Take 1 capsule (100 mg total) by mouth 2 (two) times daily. 100 capsule 3  . Multiple Vitamins-Minerals (CENTRUM SILVER PO) Take by mouth.    Vladimir Faster Glycol-Propyl Glycol (SYSTANE OP) Apply 1 drop to eye 2 (two) times daily.    Marland Kitchen venlafaxine XR (EFFEXOR-XR) 75 MG 24 hr capsule Take 1 capsule (75 mg total) by mouth daily with breakfast. 30 capsule 1  . cycloSPORINE  (RESTASIS) 0.05 % ophthalmic emulsion Place 1 drop into both eyes 2 (two) times daily. (Patient not taking: Reported on 07/15/2015) 30 mL 3  . prochlorperazine (COMPAZINE) 10 MG tablet Take 1 tablet (10 mg total) by mouth every 6 (six) hours as needed (Nausea or vomiting). (Patient not taking: Reported on 08/05/2015) 30 tablet 1  . tobramycin-dexamethasone (TOBRADEX) ophthalmic solution Place 2 drops into both eyes every 4 (four) hours while awake. (Patient not taking: Reported on 08/19/2015) 5 mL 0   No current facility-administered medications for this visit.    OBJECTIVE: middle-aged white woman  Filed Vitals:   08/19/15 1035  BP: 133/75  Pulse: 100  Temp: 97.9 F (36.6 C)  Resp: 18     Body mass index is 26.55 kg/(m^2).    ECOG FS:1 - Symptomatic but completely ambulatory  Sclerae unicteric, pupils round and equal Oropharynx clear and moist-- no thrush or other lesions No cervical  or supraclavicular adenopathy Lungs no rales or rhonchi Heart regular rate and rhythm Abd soft, nontender, positive bowel sounds MSK no focal spinal tenderness, no upper extremity lymphedema Neuro: nonfocal, well oriented, appropriate affect Breasts: deferred  LAB RESULTS:  CMP     Component Value Date/Time   NA 144 08/19/2015 1020   K 4.2 08/19/2015 1020   CO2 26 08/19/2015 1020   GLUCOSE 87 08/19/2015 1020   BUN 12.6 08/19/2015 1020   CREATININE 0.8 08/19/2015 1020   CALCIUM 9.5 08/19/2015 1020   PROT 6.3* 08/19/2015 1020   ALBUMIN 3.6 08/19/2015 1020   AST 17 08/19/2015 1020   ALT 24 08/19/2015 1020   ALKPHOS 85 08/19/2015 1020   BILITOT 0.32 08/19/2015 1020    INo results found for: SPEP, UPEP  Lab Results  Component Value Date   WBC 8.0 08/19/2015   NEUTROABS 5.4 08/19/2015   HGB 11.7 08/19/2015   HCT 34.8 08/19/2015   MCV 107.4* 08/19/2015   PLT 173 08/19/2015      Chemistry      Component Value Date/Time   NA 144 08/19/2015 1020   K 4.2 08/19/2015 1020   CO2 26  08/19/2015 1020   BUN 12.6 08/19/2015 1020   CREATININE 0.8 08/19/2015 1020      Component Value Date/Time   CALCIUM 9.5 08/19/2015 1020   ALKPHOS 85 08/19/2015 1020   AST 17 08/19/2015 1020   ALT 24 08/19/2015 1020   BILITOT 0.32 08/19/2015 1020       No results found for: LABCA2  No components found for: YYQMG500  No results for input(s): INR in the last 168 hours.  Urinalysis No results found for: COLORURINE, APPEARANCEUR, LABSPEC, PHURINE, GLUCOSEU, HGBUR, BILIRUBINUR, KETONESUR, PROTEINUR, UROBILINOGEN, NITRITE, LEUKOCYTESUR  STUDIES: No results found.  ASSESSMENT: 49 y.o. [place] woman status post left breast biopsy 04/14/2015 for a clinical T2 N0, stage IIA invasive ductal carcinoma, grade 2, estrogen and progesterone receptor positive, with HER-2 amplified, and an MIB-1 of 20%  (1). Neoadjuvant chemotherapy consisting of carboplatin, docetaxel, trastuzumab and pertuzumab every 21 days 6, with onpro support, satrted 05/06/2015  (a) pertuzumab held after 1st cycle because of rash  (2) trastuzumab to be continued to total one year  (a) most recent echo 04/30/2015 shows EF 55-60%  (3) surgery to follow chemotherapy  (4) radiation to follow surgery  (5) anti-estrogens to follow radiation  (6) genetics testing pending  PLAN: Harshita is excited to be completing chemotherapy today. The labs were reviewed in detail and were stable. She will proceed with her 6th and final cycle of carboplatin, docetaxel, trastuzumab, and pertuzumab as planned today. She still has not had an echocardiogram despite 2 previous efforts. We will reach out to this office again today.   She is scheduled for a final breast MRI tomorrow and will meet with Dr. Donne Hazel on Monday to discuss a surgical plan.  Fable will return in 1 week for labs and a nadir visit . She understands and agrees with this plan. She knows the goal of treatment in her case is cure. She has been encouraged to call with any  issues that might arise before her next visit here.   Laurie Panda, NP   08/19/2015 11:13 AM

## 2015-08-20 ENCOUNTER — Telehealth: Payer: Self-pay | Admitting: Oncology

## 2015-08-20 ENCOUNTER — Other Ambulatory Visit: Payer: BLUE CROSS/BLUE SHIELD

## 2015-08-20 NOTE — Telephone Encounter (Signed)
s.w. pt and confirmed 8.26 appt....pt ok and aware

## 2015-08-23 ENCOUNTER — Ambulatory Visit
Admission: RE | Admit: 2015-08-23 | Discharge: 2015-08-23 | Disposition: A | Discharge status: Home / Self Care | Payer: BLUE CROSS/BLUE SHIELD | Source: Ambulatory Visit | Attending: Nurse Practitioner | Admitting: Nurse Practitioner

## 2015-08-23 ENCOUNTER — Other Ambulatory Visit: Payer: Self-pay | Admitting: General Surgery

## 2015-08-23 ENCOUNTER — Other Ambulatory Visit: Payer: Self-pay | Admitting: Oncology

## 2015-08-23 ENCOUNTER — Other Ambulatory Visit (HOSPITAL_COMMUNITY): Payer: BLUE CROSS/BLUE SHIELD

## 2015-08-23 ENCOUNTER — Other Ambulatory Visit: Payer: Self-pay | Admitting: Nurse Practitioner

## 2015-08-23 DIAGNOSIS — C50412 Malignant neoplasm of upper-outer quadrant of left female breast: Secondary | ICD-10-CM

## 2015-08-23 MED ORDER — GADOBENATE DIMEGLUMINE 529 MG/ML IV SOLN
16.0000 mL | Freq: Once | INTRAVENOUS | Status: AC | PRN
  Administered 2015-08-23: 16 mL via INTRAVENOUS

## 2015-08-24 ENCOUNTER — Encounter: Payer: Self-pay | Admitting: Genetic Counselor

## 2015-08-24 ENCOUNTER — Telehealth: Payer: Self-pay | Admitting: Genetic Counselor

## 2015-08-24 ENCOUNTER — Other Ambulatory Visit (HOSPITAL_BASED_OUTPATIENT_CLINIC_OR_DEPARTMENT_OTHER): Payer: BLUE CROSS/BLUE SHIELD

## 2015-08-24 ENCOUNTER — Ambulatory Visit (HOSPITAL_BASED_OUTPATIENT_CLINIC_OR_DEPARTMENT_OTHER): Payer: BLUE CROSS/BLUE SHIELD | Admitting: Genetic Counselor

## 2015-08-24 DIAGNOSIS — Z315 Encounter for genetic counseling: Secondary | ICD-10-CM | POA: Diagnosis not present

## 2015-08-24 DIAGNOSIS — C50412 Malignant neoplasm of upper-outer quadrant of left female breast: Secondary | ICD-10-CM

## 2015-08-24 DIAGNOSIS — Z803 Family history of malignant neoplasm of breast: Secondary | ICD-10-CM

## 2015-08-24 DIAGNOSIS — Z85828 Personal history of other malignant neoplasm of skin: Secondary | ICD-10-CM

## 2015-08-24 DIAGNOSIS — Z808 Family history of malignant neoplasm of other organs or systems: Secondary | ICD-10-CM

## 2015-08-24 DIAGNOSIS — Z801 Family history of malignant neoplasm of trachea, bronchus and lung: Secondary | ICD-10-CM | POA: Diagnosis not present

## 2015-08-24 DIAGNOSIS — Z8371 Family history of colonic polyps: Secondary | ICD-10-CM

## 2015-08-24 DIAGNOSIS — G43009 Migraine without aura, not intractable, without status migrainosus: Secondary | ICD-10-CM

## 2015-08-24 LAB — COMPREHENSIVE METABOLIC PANEL (CC13)
ALBUMIN: 3.5 g/dL (ref 3.5–5.0)
ALK PHOS: 99 U/L (ref 40–150)
ALT: 43 U/L (ref 0–55)
ANION GAP: 7 meq/L (ref 3–11)
AST: 20 U/L (ref 5–34)
BILIRUBIN TOTAL: 0.35 mg/dL (ref 0.20–1.20)
BUN: 17.9 mg/dL (ref 7.0–26.0)
CALCIUM: 8.8 mg/dL (ref 8.4–10.4)
CO2: 28 meq/L (ref 22–29)
CREATININE: 0.7 mg/dL (ref 0.6–1.1)
Chloride: 103 mEq/L (ref 98–109)
Glucose: 99 mg/dl (ref 70–140)
Potassium: 4 mEq/L (ref 3.5–5.1)
Sodium: 138 mEq/L (ref 136–145)
TOTAL PROTEIN: 6.2 g/dL — AB (ref 6.4–8.3)

## 2015-08-24 LAB — CBC WITH DIFFERENTIAL/PLATELET
BASO%: 0.6 % (ref 0.0–2.0)
BASOS ABS: 0.1 10*3/uL (ref 0.0–0.1)
EOS%: 0.7 % (ref 0.0–7.0)
Eosinophils Absolute: 0.1 10*3/uL (ref 0.0–0.5)
HEMATOCRIT: 34.2 % — AB (ref 34.8–46.6)
HEMOGLOBIN: 11.6 g/dL (ref 11.6–15.9)
LYMPH#: 2.5 10*3/uL (ref 0.9–3.3)
LYMPH%: 27.1 % (ref 14.0–49.7)
MCH: 36.6 pg — AB (ref 25.1–34.0)
MCHC: 33.9 g/dL (ref 31.5–36.0)
MCV: 107.9 fL — ABNORMAL HIGH (ref 79.5–101.0)
MONO#: 0.2 10*3/uL (ref 0.1–0.9)
MONO%: 2.7 % (ref 0.0–14.0)
NEUT%: 68.9 % (ref 38.4–76.8)
NEUTROS ABS: 6.2 10*3/uL (ref 1.5–6.5)
Platelets: 130 10*3/uL — ABNORMAL LOW (ref 145–400)
RBC: 3.17 10*6/uL — ABNORMAL LOW (ref 3.70–5.45)
RDW: 14.6 % — AB (ref 11.2–14.5)
WBC: 9 10*3/uL (ref 3.9–10.3)

## 2015-08-24 NOTE — Telephone Encounter (Signed)
Called Cynthia [name] to let her know that the lab missed her genetic testing kit when they were drawing her blood for her other labwork.  Discussed that she could come back in for another blood draw or come back later this week.  [name] has a lab appt on Thursday morning for a blood draw and she would like to get the sample for genetic testing at that time.

## 2015-08-24 NOTE — Progress Notes (Signed)
REFERRING PROVIDER: WAKEFIELD, MATTHEW, MD  OTHER PROVIDER(S): MAGRINAT, GUSTAV, MD  PRIMARY PROVIDER:  No PCP Per Patient  PRIMARY REASON FOR VISIT:  1. Breast cancer of upper-outer quadrant of left female breast   2. History of skin cancer   3. Family history of breast cancer in female   10. Family history of melanoma   5. Family history of lung cancer   6. Family history of colonic polyps      HISTORY OF PRESENT ILLNESS:   Cynthia [name], a 49 y.o. female, was seen for a [place] cancer genetics consultation at the request of Dr. Donne Hazel due to a personal history of breast cancer at 20, as well as a personal history of skin cancers, and family history of breast and other cancers.  Cynthia [name] presents to clinic today to discuss the possibility of a hereditary predisposition to cancer, genetic testing, and to further clarify her future cancer risks, as well as potential cancer risks for family members.   In April 2016, at the age of 81, Cynthia [name] was diagnosed with invasive ductal carcinoma of the left breast.  Hormone receptor status was triple positive.  This was treated with neoadjuvant chemotherapy.  Surgical decisions will be determined following genetic test results--surgery date is established for September 27th.  CANCER HISTORY:    Breast cancer of upper-outer quadrant of left female breast   04/16/2015 Initial Diagnosis Breast cancer of upper-outer quadrant of left female breast     HORMONAL RISK FACTORS:  Menarche was at age 54.  First live birth at age 41.  OCP use for approximately on and off for approx 25 years (pills and mirena IUD) Ovaries intact: yes.  Hysterectomy: no.  Menopausal status: no periods since May 2016.  HRT use: 0 years. Colonoscopy: no; not examined. Mammogram within the last year: yes. Number of breast biopsies: 1. Up to date with pelvic exams:  yes. Any excessive radiation exposure in the past:  no  Past Medical History  Diagnosis  Date  . 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 Anxiety   . Hot flashes   . Breast cancer 03/2015    IDC of UOQ of left breast; ER/PR+, Her2+  . Skin cancer     Hx of melanoma at 84y; multiple basal cell carcinomas first dx in late 26s    Past Surgical History  Procedure Laterality Date  . Bladder suspension  2009  . Dnc    . Dilation and curettage of uterus    . Portacath placement Right 04/27/2015    Procedure: INSERTION PORT-A-CATH;  Surgeon: Rolm Bookbinder, MD;  Location: [facility];  Service: General;  Laterality: Right;    Social History   Social History  . Marital Status: Divorced    Spouse Name: N/A  . Number of Children: N/A  . Years of Education: N/A   Social History Main Topics  . Smoking status: Former Smoker    Types: Cigarettes    Quit date: 04/22/2009  . Smokeless tobacco: Never Used  . Alcohol Use: Yes     Comment: occasion drink; only smoked cigs socially while in college  . Drug Use: No  . Sexual Activity: Not on file   Other Topics Concern  . Not on file   Social History Narrative     FAMILY HISTORY:  We obtained a detailed, 4-generation family history.  Significant diagnoses are listed below: Family History  Problem Relation Age of Onset  .  Breast cancer Maternal Aunt     dx. 44s  . Lung cancer Maternal Grandmother     dx. late 50s-early 60s; "metastasis to breast"; smoker  . Lung cancer Paternal Grandfather     dx. 72s; smokeless tobacco user  . Other Mother     hx of TAH  . Colon polyps Father     unspecified amt  . Melanoma Maternal Uncle 64  . Heart Problems Paternal Grandmother   . Diabetes Paternal Grandmother   . Melanoma Other 86    Cynthia [name] has two daughters from a previous partnership.  They are 21 and 50 years of age and have not had any cancer diagnoses.  She also has a 50 year old daughter who is cancer-free.  Cynthia [name] is an only child.  Her mother is currently 33  and has not had cancer.  Her father is 33 and has not had cancer, but does have a history of colon polyps (Cynthia [name] is unsure of how many).    There is a maternal family history of breast cancer diagnosed in a maternal aunt in her late 58s.  This aunt, who is a maternal half-sister to Ms. Roxan Diesel mother, is currently 54 and lives in [place].  Cynthia [name] is not sure whether she has had genetic testing, but does not think she has.  The brother of this half-sister is currently 5 and has a recent history of melanoma diagnosed at 57.  There is one additional maternal half-brother from a third partnership, who is currently 89 and has not had cancer.  Ms. Roxan Diesel mother has no full siblings.  Ms. Roxan Diesel maternal grandmother, who was a smoker, died of metastatic lung cancer in her 50s.  Cynthia [name] reports that this lung cancer metastasized to the breast.  This grandmother had one brother who never had cancer to Ms. Roxan Diesel knowledge.  Her mother was diagnosed with melanoma at the age of 12.  Ms. Roxan Diesel has limited information for her maternal grandfather, but believes he may have died of TB.  Cynthia [name] reports no additional cancers for any maternal first cousins or other relatives.  Ms. Roxan Diesel father has one full brother, who is currently 33 and has not been diagnosed with cancer.  He has seven children and none of these children have had cancer.  Ms. Roxan Diesel paternal grandmother died of heart and diabetes-related issues in her 60s.  Her paternal grandfather died of lung cancer in his 8s.  She is aware that he was a smokeless tobacco user.  This grandfather had multiple siblings, for whom Cynthia [name] has limited information; however, she reports no breast cancer in any of his sisters.  Patient's maternal ancestors are of Caucasian descent, and paternal ancestors are of Caucasian/German descent. There is no reported Ashkenazi Jewish ancestry. There is no known  consanguinity.  GENETIC COUNSELING ASSESSMENT: Cynthia [name] is a 50 y.o. female with a personal and family history of breast and other cancers which is somewhat suggestive of a hereditary cancer syndrome and predisposition to cancer. We, therefore, discussed and recommended the following at today's visit.   DISCUSSION: We reviewed the characteristics, features and inheritance patterns of hereditary cancer syndromes, particularly those caused by mutations within the BRCA1/2 genes. We also discussed genetic testing, including the appropriate family members to test, the process of testing, insurance coverage and turn-around-time for results. We discussed the implications of a negative, positive and/or variant of uncertain significant result. We discussed smaller and larger panel testing options  and the benefits and drawbacks of each.  Cynthia [name] decided to proceed with genetic testing for the smaller panel.  Thus, we recommended Cynthia [name] pursue genetic testing for the 8-gene Breast Cancer High/Moderate Risk Panel through [facility] Hope Pigeon, MD).  The Breast Cancer High/Moderate Risk Panel offered by [facility] includes sequencing and deletion/duplication analysis of the following 8 genes: ATM, BRCA1, BRCA2, CDH1, CHEK2, PALB2, PTEN, and TP53.    Based on Cynthia [name] personal and family history of cancer, she meets medical criteria for genetic testing. Despite that she meets criteria, she may still have an out of pocket cost. We discussed that if her out of pocket cost for testing is over $100, the laboratory will call and confirm whether she wants to proceed with testing.  If the out of pocket cost of testing is less than $100 she will be billed by the genetic testing laboratory.   PLAN: After considering the risks, benefits, and limitations, Cynthia [name]  provided informed consent to pursue genetic testing and the blood sample was sent to [facility] for analysis of the 8-gene  Breast Cancer High/Moderate Risk Panel test. Results are generally available within approximately 2-3 weeks' time, however, since surgical decisions are pending these genetic test results and since Cynthia [name] will be out of town the week of September 18th, we will let [facility] know that we would like these results back STAT.   Thus, we can likely expect to have these results back closer to the 2-week time frame.  At that point in time, they will be disclosed by telephone to Cynthia [name], as will any additional recommendations warranted by these results. Cynthia [name] will receive a summary of her genetic counseling visit and a copy of her results once available. This information will also be available in Epic. We encouraged Cynthia [name] to remain in contact with cancer genetics annually so that we can continuously update the family history and inform her of any changes in cancer genetics and testing that may be of benefit for her family. Cynthia [name] questions were answered to her satisfaction today. Our contact information was provided should additional questions or concerns arise.  Thank you for the referral and allowing [place] to share in the care of your patient.   Cynthia Luz, MS Genetic Counselor [email or web address] Phone: [phone]  The patient was seen for a total of 50 minutes in face-to-face genetic counseling.  This patient was discussed with Drs. Magrinat, Lindi Adie and/or Burr Medico who agrees with the above.    _______________________________________________________________________ For Office Staff:  Number of people involved in session: 1 Was an Intern/ student involved with case: no

## 2015-08-25 ENCOUNTER — Ambulatory Visit (HOSPITAL_COMMUNITY)
Admission: RE | Admit: 2015-08-25 | Discharge: 2015-08-25 | Disposition: A | Discharge status: Home / Self Care | Payer: BLUE CROSS/BLUE SHIELD | Source: Ambulatory Visit | Attending: Nurse Practitioner | Admitting: Nurse Practitioner

## 2015-08-25 DIAGNOSIS — C50412 Malignant neoplasm of upper-outer quadrant of left female breast: Secondary | ICD-10-CM | POA: Insufficient documentation

## 2015-08-25 NOTE — Progress Notes (Signed)
  Echocardiogram 2D Echocardiogram has been performed.  Cynthia [name] 08/25/2015, 11:54 AM

## 2015-08-26 ENCOUNTER — Ambulatory Visit (HOSPITAL_BASED_OUTPATIENT_CLINIC_OR_DEPARTMENT_OTHER): Payer: BLUE CROSS/BLUE SHIELD | Admitting: Nurse Practitioner

## 2015-08-26 ENCOUNTER — Encounter: Payer: Self-pay | Admitting: Nurse Practitioner

## 2015-08-26 ENCOUNTER — Other Ambulatory Visit (HOSPITAL_BASED_OUTPATIENT_CLINIC_OR_DEPARTMENT_OTHER): Payer: BLUE CROSS/BLUE SHIELD

## 2015-08-26 ENCOUNTER — Telehealth: Payer: Self-pay | Admitting: Nurse Practitioner

## 2015-08-26 VITALS — BP 116/59 | HR 74 | Temp 98.2°F | Resp 18 | Ht 68.0 in | Wt 178.5 lb

## 2015-08-26 DIAGNOSIS — G43009 Migraine without aura, not intractable, without status migrainosus: Secondary | ICD-10-CM

## 2015-08-26 DIAGNOSIS — C50412 Malignant neoplasm of upper-outer quadrant of left female breast: Secondary | ICD-10-CM | POA: Diagnosis not present

## 2015-08-26 DIAGNOSIS — R21 Rash and other nonspecific skin eruption: Secondary | ICD-10-CM

## 2015-08-26 LAB — CBC WITH DIFFERENTIAL/PLATELET
BASO%: 0.5 % (ref 0.0–2.0)
Basophils Absolute: 0 10*3/uL (ref 0.0–0.1)
EOS%: 0.2 % (ref 0.0–7.0)
Eosinophils Absolute: 0 10*3/uL (ref 0.0–0.5)
HEMATOCRIT: 33.9 % — AB (ref 34.8–46.6)
HGB: 11.4 g/dL — ABNORMAL LOW (ref 11.6–15.9)
LYMPH#: 2 10*3/uL (ref 0.9–3.3)
LYMPH%: 28.6 % (ref 14.0–49.7)
MCH: 36.4 pg — ABNORMAL HIGH (ref 25.1–34.0)
MCHC: 33.6 g/dL (ref 31.5–36.0)
MCV: 108.4 fL — ABNORMAL HIGH (ref 79.5–101.0)
MONO#: 1.3 10*3/uL — AB (ref 0.1–0.9)
MONO%: 17.6 % — ABNORMAL HIGH (ref 0.0–14.0)
NEUT%: 53.1 % (ref 38.4–76.8)
NEUTROS ABS: 3.8 10*3/uL (ref 1.5–6.5)
PLATELETS: 144 10*3/uL — AB (ref 145–400)
RBC: 3.13 10*6/uL — ABNORMAL LOW (ref 3.70–5.45)
RDW: 15.9 % — ABNORMAL HIGH (ref 11.2–14.5)
WBC: 7.1 10*3/uL (ref 3.9–10.3)

## 2015-08-26 MED ORDER — NYSTATIN 100000 UNIT/GM EX CREA: 1.0000 "application " | TOPICAL_CREAM | Freq: Two times a day (BID) | CUTANEOUS | Status: DC

## 2015-08-26 NOTE — Telephone Encounter (Signed)
Gave avs & calendar for September/October. °

## 2015-08-26 NOTE — Progress Notes (Addendum)
[name]  [phone] [phone]     ID: Cynthia [name] DOB: [birth year]  MR#: [id number]  HKV#:[id number]  Patient Care Team: No Pcp Per Patient as PCP - General (General Practice) Rolm Bookbinder, MD as Consulting Physician (General Surgery) Chauncey Cruel, MD as Consulting Physician (Oncology) Kyung Rudd, MD as Consulting Physician (Radiation Oncology) Mauro Kaufmann, RN as Registered Nurse Rockwell Germany, RN as Registered Nurse PCP: No PCP Per Patient OTHER MD: Christene Slates M.D.  CHIEF COMPLAINT: Triple positive breast cancer  CURRENT TREATMENT: Neoadjuvant chemotherapy   BREAST CANCER HISTORY: From the original intake note:  Cynthia [name] had noted a change in her left breast on self-exam the last week in March. She brought it to Dr. Trellis Paganini attention and on 04/13/2015 underwent diagnostic bilateral mammography and left ultrasonography at [facility]. This found the breast composition to be category D. An area of architectural distortion was noted in the left breast upper outer quadrant correlating to the palpable mass. By ultrasound there was a 2 cm lobulated mass in the left breast upper outer quadrant which was hypoechoic. There was a 4 mm nodule immediately adjacent to this felt to be a satellite lesion.  Biopsy of the left breast mass in question 04/14/2015 showed ([id number]) invasive ductal carcinoma, grade 2, estrogen receptor 90% positive, progesterone receptor 90% positive, with an MIB-1 of 20%, and HER-2 amplified, the signals ratio being 5.36.  The patient's subsequent history is as detailed below  INTERVAL HISTORY: Cynthia [name] returns today for follow up of her breast cancer, alone. Today is day 8, cycle 6 of 6 cycles planned of carboplatin, docetaxel, and trastuzumab.   REVIEW OF SYSTEMS: Cynthia [name] feels like this final cycle was worse than all of the previous ones. She is more fatigued that usual. She denies fevers, chills, nausea, or vomiting.  She was constipated but this is better. Her tongue has no thrush or lesions, but "feels rough." her eyes are tearing less. Her hot flashes continue to be moderately intense on 68m venlafaxine daily and gabapentin 3041mQHS. He appetite is somewhat down and her taste is off. She has a pruritic rash to the underside of each breast. A detailed review of systems is otherwise stable.  PAST MEDICAL HISTORY: Past Medical History  Diagnosis Date  . 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 Anxiety   . Hot flashes   . Breast cancer 03/2015    IDC of UOQ of left breast; ER/PR+, Her2+  . Skin cancer     Hx of melanoma at 4470ymultiple basal cell carcinomas first dx in late 3053s [place] PAST SURGICAL HISTORY: Past Surgical History  Procedure Laterality Date  . Bladder suspension  2009  . Dnc    . Dilation and curettage of uterus    . Portacath placement Right 04/27/2015    Procedure: INSERTION PORT-A-CATH;  Surgeon: MaRolm BookbinderMD;  Location: [facility] Service: General;  Laterality: Right;    FAMILY HISTORY Family History  Problem Relation Age of Onset  . Breast cancer Maternal Aunt     dx. 3018s. Lung cancer Maternal Grandmother     dx. late 50s-early 60s; "metastasis to breast"; smoker  . Lung cancer Paternal Grandfather     dx. 6055ssmokeless tobacco user  . Other Mother     hx of TAH  . Colon polyps Father     unspecified amt  .  Melanoma Maternal Uncle 64  . Heart Problems Paternal Grandmother   . Diabetes Paternal Grandmother   . Melanoma Other 4   the patient's parents are still living as of April 2016. They are divorced. The patient is an only child. The patient's paternal grandfather and maternal grandmother had a history of lung cancer. On the mother's side there is a history of melanoma. The patient's mother's only sister was diagnosed with breast cancer in her 59s. There is no history of  ovarian cancer in the family.   GYNECOLOGIC HISTORY:  No LMP recorded.  menarche age 42, first live birth age 72, the patient is GX P3. Her periods are ongoing but irregular. She has a Mirena in place for contraception. She took oral contraceptives for approximately 15 years in the past with no complications.   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 She is single, and lives at home with her father, who is disabled and has a history of depression. At home also is [name] "Cynthia [name], 49 years old as of April 2016. The patient's 2 other daughters are Cynthia [name], lives in [place] and is a physical therapy technician, and Cynthia [name], lives in [facility] and works in Press photographer.     ADVANCED DIRECTIVES:  not in place.    HEALTH MAINTENANCE: Social History  Substance Use Topics  . Smoking status: Former Smoker    Types: Cigarettes    Quit date: 04/22/2009  . Smokeless tobacco: Never Used  . Alcohol Use: Yes     Comment: occasion drink; only smoked cigs socially while in college     Colonoscopy: never   PAP:  Bone density: never   Lipid panel:  Allergies  Allergen Reactions  . Zofran [Ondansetron Hcl] Other (See Comments)    headache  . Adhesive [Tape] Rash    Steri strip    Current Outpatient Prescriptions  Medication Sig Dispense Refill  . cycloSPORINE (RESTASIS) 0.05 % ophthalmic emulsion Place 1 drop into both eyes 2 (two) times daily. 30 mL 3  . dexamethasone (DECADRON) 4 MG tablet Take 2 tablets (8 mg total) by mouth 2 (two) times daily. Start the day before Taxotere. Then again the day after chemo for 3 days. 30 tablet 1  . famotidine (PEPCID) 20 MG tablet Take 1 tablet (20 mg total) by mouth 2 (two) times daily. 30 tablet 0  . gabapentin (NEURONTIN) 300 MG capsule Take 1 capsule (300 mg total) by mouth at bedtime. 90 capsule 3  . lidocaine-prilocaine (EMLA) cream Apply to affected area once 30 g 3  . loratadine  (CLARITIN) 10 MG tablet Take 1 tablet (10 mg total) by mouth daily. Daily for 3-5 days starting on neulasta injection day 30 tablet 2  . Multiple Vitamins-Minerals (CENTRUM SILVER PO) Take by mouth.    Vladimir Faster Glycol-Propyl Glycol (SYSTANE OP) Apply 1 drop to eye 2 (two) times daily.    Marland Kitchen venlafaxine XR (EFFEXOR-XR) 75 MG 24 hr capsule Take 1 capsule (75 mg total) by mouth daily with breakfast. 30 capsule 1  . ALPRAZolam (XANAX) 1 MG tablet     . minocycline (MINOCIN) 100 MG capsule Take 1 capsule (100 mg total) by mouth 2 (two) times daily. (Patient not taking: Reported on 08/26/2015) 100 capsule 3  . prochlorperazine (COMPAZINE) 10 MG tablet Take 1 tablet (10 mg total) by mouth every 6 (six) hours as needed (Nausea or vomiting). (Patient not taking: Reported on 08/05/2015) 30  tablet 1  . tobramycin-dexamethasone (TOBRADEX) ophthalmic solution Place 2 drops into both eyes every 4 (four) hours while awake. (Patient not taking: Reported on 08/19/2015) 5 mL 0   No current facility-administered medications for this visit.    OBJECTIVE: middle-aged white woman  Filed Vitals:   08/26/15 1109  BP: 116/59  Pulse: 74  Temp: 98.2 F (36.8 C)  Resp: 18     Body mass index is 27.15 kg/(m^2).    ECOG FS:1 - Symptomatic but completely ambulatory  Skin: erythematous generalized rash under each breast HEENT: sclerae anicteric, conjunctivae pink, oropharynx clear. No thrush or mucositis.  Lymph Nodes: No cervical or supraclavicular lymphadenopathy  Lungs: clear to auscultation bilaterally, no rales, wheezes, or rhonci  Heart: regular rate and rhythm  Abdomen: round, soft, non tender, positive bowel sounds  Musculoskeletal: No focal spinal tenderness, no peripheral edema  Neuro: non focal, well oriented, positive affect  Breasts: deferred  LAB RESULTS:  CMP     Component Value Date/Time   NA 138 08/24/2015 1207   K 4.0 08/24/2015 1207   CO2 28 08/24/2015 1207   GLUCOSE 99 08/24/2015 1207    BUN 17.9 08/24/2015 1207   CREATININE 0.7 08/24/2015 1207   CALCIUM 8.8 08/24/2015 1207   PROT 6.2* 08/24/2015 1207   ALBUMIN 3.5 08/24/2015 1207   AST 20 08/24/2015 1207   ALT 43 08/24/2015 1207   ALKPHOS 99 08/24/2015 1207   BILITOT 0.35 08/24/2015 1207    INo results found for: SPEP, UPEP  Lab Results  Component Value Date   WBC 7.1 08/26/2015   NEUTROABS 3.8 08/26/2015   HGB 11.4* 08/26/2015   HCT 33.9* 08/26/2015   MCV 108.4* 08/26/2015   PLT 144* 08/26/2015      Chemistry      Component Value Date/Time   NA 138 08/24/2015 1207   K 4.0 08/24/2015 1207   CO2 28 08/24/2015 1207   BUN 17.9 08/24/2015 1207   CREATININE 0.7 08/24/2015 1207      Component Value Date/Time   CALCIUM 8.8 08/24/2015 1207   ALKPHOS 99 08/24/2015 1207   AST 20 08/24/2015 1207   ALT 43 08/24/2015 1207   BILITOT 0.35 08/24/2015 1207       No results found for: LABCA2  No components found for: LABCA125  No results for input(s): INR in the last 168 hours.  Urinalysis No results found for: COLORURINE, APPEARANCEUR, LABSPEC, PHURINE, GLUCOSEU, HGBUR, BILIRUBINUR, KETONESUR, PROTEINUR, UROBILINOGEN, NITRITE, LEUKOCYTESUR  STUDIES: Mr Breast Bilateral W Wo Contrast  08/23/2015   CLINICAL DATA:  Biopsy-proven left breast carcinoma. The patient has undergone neoadjuvant chemotherapy. MRI performed to follow-up response.  LABS:  None performed  EXAM: BILATERAL BREAST MRI WITH AND WITHOUT CONTRAST  TECHNIQUE: Multiplanar, multisequence MR images of both breasts were obtained prior to and following the intravenous administration of 16 ml of MultiHance.  THREE-DIMENSIONAL MR IMAGE RENDERING ON INDEPENDENT WORKSTATION:  Three-dimensional MR images were rendered by post-processing of the original MR data on an independent workstation. The three-dimensional MR images were interpreted, and findings are reported in the following complete MRI report for this study. Three dimensional images were evaluated at  the independent DynaCad workstation  COMPARISON:  Prior breast MRI 04/26/2015 and previous mammogram and ultrasound in 2016 from [facility].  FINDINGS: Breast composition: d. Extreme fibroglandular tissue.  Background parenchymal enhancement: Mild  Right breast: Are scattered T2 bright cysts. A few these have mild peripheral enhancement. There are no suspicious masses or suspicious  enhancement.  Left breast: Biopsy clip artifact is seen in the posterior third of the upper outer left breast. No residual mass or abnormal enhancement is seen at the site of the previously biopsied left breast carcinoma. There are multiple scattered T2 bright cysts in the left breast, a few of which have mild peripheral enhancement.  Lymph nodes: No abnormal appearing lymph nodes.  Ancillary findings:  None.  IMPRESSION: Significant positive response to neoadjuvant chemotherapy. No residual mass or abnormal enhancement is seen in the upper outer left breast posteriorly at the site of the previously biopsied carcinoma. Biopsy clip is present in this region.  Bilateral breast cysts.  RECOMMENDATION: Continue treatment planning and annual mammography.  BI-RADS CATEGORY  6: Known biopsy-proven malignancy.   Electronically Signed   By: Curlene Dolphin M.D.   On: 08/23/2015 10:55    ASSESSMENT: 49 y.o. [place] woman status post left breast biopsy 04/14/2015 for a clinical T2 N0, stage IIA invasive ductal carcinoma, grade 2, estrogen and progesterone receptor positive, with HER-2 amplified, and an MIB-1 of 20%  (1). Neoadjuvant chemotherapy consisting of carboplatin, docetaxel, trastuzumab and pertuzumab every 21 days 6, with onpro support, satrted 05/06/2015  (a) pertuzumab held after 1st cycle because of rash  (2) trastuzumab to be continued to total one year  (a) most recent echo 08/25/2015 shows EF 55-60%  (3) surgery to follow chemotherapy  (4) radiation to follow surgery  (5) anti-estrogens to follow  radiation  (6) genetics testing pending  PLAN: Cynthia [name] is still recovering from her last chemotherapy treatment. We reviewed the results of her final breast MRI which showed a complete radiologic response. The labs were reviewed in detail and were stable.   Her breast rash is likely fungal in nature. I sent in a prescription for nystatin cream to her pharmacy to apply BID to this area.  She met with Dr. Donne Hazel and he is rushing her genetics testing, as this might alter her surgical plan, but for now she anticipates a lumpectomy.  Cynthia [name] is expected to undergo surgery in late September. She will follow up with Dr. Jana Hakim in mid October. She understands and agrees with this plan. She knows the goal of treatment in her case is cure. She has been encouraged to call with any issues that might arise before her next visit here.   Laurie Panda, NP   08/26/2015 12:35 PM

## 2015-08-31 ENCOUNTER — Other Ambulatory Visit: Payer: Self-pay | Admitting: Nurse Practitioner

## 2015-08-31 MED ORDER — MINOCYCLINE HCL 100 MG PO CAPS: 100.0000 mg | ORAL_CAPSULE | Freq: Two times a day (BID) | ORAL | Status: DC

## 2015-09-02 ENCOUNTER — Other Ambulatory Visit: Payer: Self-pay | Admitting: *Deleted

## 2015-09-02 MED ORDER — VENLAFAXINE HCL ER 75 MG PO CP24: 75.0000 mg | ORAL_CAPSULE | Freq: Every day | ORAL | Status: DC

## 2015-09-06 ENCOUNTER — Telehealth: Payer: Self-pay | Admitting: Genetic Counselor

## 2015-09-06 ENCOUNTER — Ambulatory Visit: Payer: Self-pay | Admitting: Genetic Counselor

## 2015-09-06 DIAGNOSIS — Z1379 Encounter for other screening for genetic and chromosomal anomalies: Secondary | ICD-10-CM

## 2015-09-06 NOTE — Telephone Encounter (Signed)
Discussed with [name] that her genetic test results were negative for pathogenic mutations within any of 8 genes that would cause her to be at an increased risk for breast and other related cancers.  Additionally, no variants of uncertain significance (VUSs) were found.  While we still do not have an explanation for the personal and family history of cancer, most cancer just happens by chance.  This result is reassuring that this is the case for Cynthia [name], peth considering that neither of her parents have had cancer.  [name] mentioned that her daughter was worried about the family history of breast cancer in her mother and also in her paternal grandmother who was diagnosed in her 65s and was, herself, considering genetic testing.  We discussed that we have no need to worry about genetic risks for her daughter based on her own negative test results and likely no further reason to worry based on the paternal history because her grandmother was diagnosed with breast cancer at a much later age and also there are no other family members on that side of the family who have had cancer.  We would still say that women in the family are at a somewhat increased risk for breast cancer based on the history, so [name] daughters should begin mammogram screening early, likely at the age of 23 (16 years prior to the age of Ms. Swartzendruber diagnosis).  Though [name] maternal aunt (maternal half-sister to [name] mother) was diagnosed in her late 38s, [name] daughters would still probably begin mammogram screening based on their mom's age of diagnosis and not this more distant relative's diagnosis age.  However, [name] daughters should make their primary doctors aware of the family history, so that, even as screening guidelines change in the future, they may receive the most updated and appropriate breast cancer screening.  We discussed [name] maternal aunt would be  eligible for genetic counseling and testing if interested, and [name] will let me know if I can be any help in coordinating this process for her.  Additionally, her aunt may use the [employer] website to locate a genetic counselor near her.  [name] is welcome to call me with any further questions.  Per request, I will send a copy of her test results to her in a secure email.  Her oncologist and surgeon will also be made aware of these results.

## 2015-09-06 NOTE — Progress Notes (Signed)
GENETIC TEST RESULTS  HPI: [name] was previously seen in the [facility] clinic due to a personal history of breast cancer at 33, family history of breast and other cancers, and concerns regarding a hereditary predisposition to cancer. Please refer to our prior cancer genetics clinic note from August 24, 2015 for more information regarding [name] medical, social and family histories, and our assessment and recommendations, at the time. [name] recent genetic test results were disclosed to her, as were recommendations warranted by these results. These results and recommendations are discussed in more detail below.  GENETIC TEST RESULTS: At the time of [name] visit on 08/24/15, we recommended she pursue genetic testing of the 8-gene Breast High/Moderate Risk Panel through [facility] Hope Pigeon, MD).  The Breast High/Moderate Risk Panel offered by [facility] includes sequencing and deletion/duplication analysis of the following 8 genes: ATM, BRCA1, BRCA2, CDH1, CHEK2, PALB2, PTEN, and TP53.  Those results are now back, the report date for which is September 06, 2015.  Genetic testing was normal, and did not reveal a deleterious mutation in these genes.  Additionally, no variants of uncertain significance (VUSs) were found.  The test report will be scanned into EPIC and will be located under the Results Review tab in the Molecular Pathology section.   We discussed with [name] that since the current genetic testing is not perfect, it is possible there may be a gene mutation in one of these genes that current testing cannot detect, but that chance is small. We also discussed, that it is possible that another gene that has not yet been discovered, or that we have not yet tested, is responsible for the cancer diagnoses in the family, and it is, therefore, important to remain in touch with cancer genetics in the future so that we can continue to offer Ms.  Cynthia [name] the most up to date genetic testing.  CANCER SCREENING RECOMMENDATIONS: While we still do not have an explanation for the personal or family history of cancer, this result is reassuring and indicates that [name] likely does not have an increased risk for a future cancer due to a mutation in one of these genes. This normal test also suggests that [name] cancer was most likely not due to an inherited predisposition associated with one of these genes.  Most cancers happen by chance and this negative test suggests that her cancer falls into this category.  We, therefore, recommended she continue to follow the cancer management and screening guidelines provided by her oncology and primary healthcare providers.   RECOMMENDATIONS FOR FAMILY MEMBERS: Women in this family might be at some increased risk of developing cancer, over the general population risk, simply due to the family history of cancer. We recommended women in this family have a yearly mammogram beginning at age 39, or 11 years younger than the earliest onset of cancer, an annual clinical breast exam, and perform monthly breast self-exams. Thus, [name] would likely begin annual mammogram screening at the age of 41.  [name] should make their primary doctors aware of the family history of cancer so they may receive the most appropriate and up-to-date cancer screening in the future, especially as guidelines may evolve.  Women in this family should also have a gynecological exam as recommended by their primary provider. All family members should have a colonoscopy by age 32.  Based on [name] family history, we recommended her maternal aunt (her mother's maternal half-sister), who  was diagnosed with breast cancer in her late 49s, and who lives in [place], have genetic counseling and testing.  [name] does not think this aunt will be interested, but will let us know if we can be  of any assistance in coordinating genetic counseling and/or testing for this family member.  Additionally, her aunt can use the [employer] Website ([employer]) to locate a cancer Dietitian in her area.  FOLLOW-UP: Lastly, we discussed with [name] that cancer genetics is a rapidly advancing field and it is possible that new genetic tests will be appropriate for her and/or her family members in the future. We encouraged her to remain in contact with cancer genetics on an annual basis so we can update her personal and family histories and let her know of advances in cancer genetics that may benefit this family.   Our contact number was provided. [name] questions were answered to her satisfaction, and she knows she is welcome to call us at anytime with additional questions or concerns.   Cynthia Luz MS Genetic Counselor [email or web address] Phone: [phone]

## 2015-09-10 ENCOUNTER — Other Ambulatory Visit (HOSPITAL_BASED_OUTPATIENT_CLINIC_OR_DEPARTMENT_OTHER): Payer: BLUE CROSS/BLUE SHIELD

## 2015-09-10 ENCOUNTER — Encounter: Payer: Self-pay | Admitting: Nurse Practitioner

## 2015-09-10 ENCOUNTER — Ambulatory Visit (HOSPITAL_BASED_OUTPATIENT_CLINIC_OR_DEPARTMENT_OTHER): Payer: BLUE CROSS/BLUE SHIELD | Admitting: Nurse Practitioner

## 2015-09-10 ENCOUNTER — Ambulatory Visit (HOSPITAL_BASED_OUTPATIENT_CLINIC_OR_DEPARTMENT_OTHER): Payer: BLUE CROSS/BLUE SHIELD

## 2015-09-10 VITALS — BP 126/75 | HR 95 | Temp 97.8°F | Resp 18

## 2015-09-10 DIAGNOSIS — C50412 Malignant neoplasm of upper-outer quadrant of left female breast: Secondary | ICD-10-CM | POA: Diagnosis not present

## 2015-09-10 DIAGNOSIS — Z5111 Encounter for antineoplastic chemotherapy: Secondary | ICD-10-CM

## 2015-09-10 DIAGNOSIS — G43009 Migraine without aura, not intractable, without status migrainosus: Secondary | ICD-10-CM

## 2015-09-10 DIAGNOSIS — W19XXXA Unspecified fall, initial encounter: Secondary | ICD-10-CM | POA: Insufficient documentation

## 2015-09-10 LAB — CBC WITH DIFFERENTIAL/PLATELET
BASO%: 0.2 % (ref 0.0–2.0)
BASOS ABS: 0 10*3/uL (ref 0.0–0.1)
EOS%: 0.6 % (ref 0.0–7.0)
Eosinophils Absolute: 0 10*3/uL (ref 0.0–0.5)
HEMATOCRIT: 34.6 % — AB (ref 34.8–46.6)
HGB: 11.7 g/dL (ref 11.6–15.9)
LYMPH#: 1.6 10*3/uL (ref 0.9–3.3)
LYMPH%: 29.8 % (ref 14.0–49.7)
MCH: 36.9 pg — AB (ref 25.1–34.0)
MCHC: 33.8 g/dL (ref 31.5–36.0)
MCV: 109.1 fL — ABNORMAL HIGH (ref 79.5–101.0)
MONO#: 0.5 10*3/uL (ref 0.1–0.9)
MONO%: 9 % (ref 0.0–14.0)
NEUT#: 3.2 10*3/uL (ref 1.5–6.5)
NEUT%: 60.4 % (ref 38.4–76.8)
PLATELETS: 122 10*3/uL — AB (ref 145–400)
RBC: 3.17 10*6/uL — AB (ref 3.70–5.45)
RDW: 14.8 % — ABNORMAL HIGH (ref 11.2–14.5)
WBC: 5.3 10*3/uL (ref 3.9–10.3)

## 2015-09-10 MED ORDER — SODIUM CHLORIDE 0.9 % IJ SOLN
10.0000 mL | INTRAMUSCULAR | Status: DC | PRN
  Administered 2015-09-10: 10 mL
  Filled 2015-09-10: qty 10

## 2015-09-10 MED ORDER — SODIUM CHLORIDE 0.9 % IV SOLN
Freq: Once | INTRAVENOUS | Status: AC
  Administered 2015-09-10: 11:00:00 via INTRAVENOUS

## 2015-09-10 MED ORDER — HEPARIN SOD (PORK) LOCK FLUSH 100 UNIT/ML IV SOLN
500.0000 [IU] | Freq: Once | INTRAVENOUS | Status: AC | PRN
Start: 2015-09-10 — End: 2015-09-10
  Administered 2015-09-10: 500 [IU]
  Filled 2015-09-10: qty 5

## 2015-09-10 MED ORDER — DIPHENHYDRAMINE HCL 25 MG PO CAPS
ORAL_CAPSULE | ORAL | Status: AC
  Filled 2015-09-10: qty 1

## 2015-09-10 MED ORDER — TRASTUZUMAB CHEMO INJECTION 440 MG
6.0000 mg/kg | Freq: Once | INTRAVENOUS | Status: AC
  Administered 2015-09-10: 483 mg via INTRAVENOUS
  Filled 2015-09-10: qty 23

## 2015-09-10 MED ORDER — ACETAMINOPHEN 325 MG PO TABS
ORAL_TABLET | ORAL | Status: AC
Start: 2015-09-10 — End: 2015-09-10
  Filled 2015-09-10: qty 2

## 2015-09-10 MED ORDER — CEPHALEXIN 500 MG PO CAPS: 500.0000 mg | ORAL_CAPSULE | Freq: Four times a day (QID) | ORAL | Status: DC

## 2015-09-10 MED ORDER — DIPHENHYDRAMINE HCL 25 MG PO CAPS
25.0000 mg | ORAL_CAPSULE | Freq: Once | ORAL | Status: AC
Start: 2015-09-10 — End: 2015-09-10
  Administered 2015-09-10: 25 mg via ORAL

## 2015-09-10 MED ORDER — ACETAMINOPHEN 325 MG PO TABS
650.0000 mg | ORAL_TABLET | Freq: Once | ORAL | Status: AC
  Administered 2015-09-10: 650 mg via ORAL

## 2015-09-10 NOTE — Assessment & Plan Note (Signed)
Patient reports that she tripped over a curb and fell; developing abrasions to her bilateral anterior knees into her left chin.  She denies hitting her head or any loss of consciousness.  She also denies any other complaints.  Following the fall.  Patient is concerned that her bilateral knee abrasions may be slightly infected.  She denies any recent fevers or chills.  Patient states that she is leaving for a vacation in [place] on Monday, 09/13/2015; with like for her wounds to be healed prior to her trip if at all possible.    On exam- the patient has what appears to be road-rash, type abrasions to bilateral anterior knees; and also has healing abrasion to her left chin.  Chin abrasion.  Does appear to be almost completely healed.  Bilateral knee abrasions with mild erythema surrounding the wounds.  There is no specific edema, warmth, or red streaks to the sites.  Bilateral knee abrasions were cleaned and Vaseline gauze dressings applied to both knees.  Per patient's request-we will prescribe Keflex antibiotics in case there is any trace infection to the wounds.  Advised patient that antibiotics may make patient more sun sensitive.  Patient was advised to call/return or go directly to the emergency department for any worsening symptoms whatsoever.

## 2015-09-10 NOTE — Assessment & Plan Note (Signed)
Patient completed her neoadjuvant chemotherapy on 08/26/2015.  She presents to the [facility] today for the Herceptin only portion of her treatment regimen.  She is scheduled for a probable lumpectomy versus mastectomy per Dr. Donne Hazel general surgeon on 09/21/2015.  Following recovery from her surgery.  She is scheduled to initiate radiation treatments.  Patient is scheduled to return on 09/30/2015 for labs and her next Herceptin infusion.

## 2015-09-10 NOTE — Progress Notes (Signed)
Pt fell earlier in the week and has presented to the infusion room with open areas to both knees that appear to be infected.  Michel Harrow NP notified and will come to assess pt in infusion room.

## 2015-09-10 NOTE — Patient Instructions (Signed)
[facility] Discharge Instructions for Patients Receiving Chemotherapy  Today you received the following chemotherapy agents:  Herceptin  To help prevent nausea and vomiting after your treatment, we encourage you to take your nausea medication as ordered per MD.   If you develop nausea and vomiting that is not controlled by your nausea medication, call the clinic.   BELOW ARE SYMPTOMS THAT SHOULD BE REPORTED IMMEDIATELY:  *FEVER GREATER THAN 100.5 F  *CHILLS WITH OR WITHOUT FEVER  NAUSEA AND VOMITING THAT IS NOT CONTROLLED WITH YOUR NAUSEA MEDICATION  *UNUSUAL SHORTNESS OF BREATH  *UNUSUAL BRUISING OR BLEEDING  TENDERNESS IN MOUTH AND THROAT WITH OR WITHOUT PRESENCE OF ULCERS  *URINARY PROBLEMS  *BOWEL PROBLEMS  UNUSUAL RASH Items with * indicate a potential emergency and should be followed up as soon as possible.  Feel free to call the clinic you have any questions or concerns. The clinic phone number is [phone].  Please show the CHEMO ALERT CARD at check-in to the Emergency Department and triage nurse.   

## 2015-09-10 NOTE — Progress Notes (Signed)
SYMPTOM MANAGEMENT CLINIC   HPI: Cynthia [name] 49 y.o. female diagnosed with breast cancer.  Recently completed neoadjuvant chemotherapy on 08/26/2015.  Currently undergoing Herceptin infusions only.  Scheduled for breast surgery on 09/21/2015.  Patient reports that she tripped over a curb and fell; developing abrasions to her bilateral anterior knees into her left chin.  She denies hitting her head or any loss of consciousness.  She also denies any other complaints.  Following the fall.  Patient is concerned that her bilateral knee abrasions may be slightly infected.  She denies any recent fevers or chills.  Patient states that she is leaving for a vacation in [place] on Monday, 09/13/2015; with like for her wounds to be healed prior to her trip if at all possible.    HPI  ROS  Past Medical History  Diagnosis Date  . 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 Anxiety   . Hot flashes   . Breast cancer 03/2015    IDC of UOQ of left breast; ER/PR+, Her2+  . Skin cancer     Hx of melanoma at 61y; multiple basal cell carcinomas first dx in late 82s    Past Surgical History  Procedure Laterality Date  . Bladder suspension  2009  . Dnc    . Dilation and curettage of uterus    . Portacath placement Right 04/27/2015    Procedure: INSERTION PORT-A-CATH;  Surgeon: Rolm Bookbinder, MD;  Location: [facility];  Service: General;  Laterality: Right;    has Breast cancer of upper-outer quadrant of left female breast; Migraines; Rash; Dehydration; Esophagitis; Drug-induced skin rash; Hot flashes; Excessive tearing; History of skin cancer; Genetic testing; and Fall on her problem list.    is allergic to zofran and adhesive.    Medication List       This list is accurate as of: 09/10/15 12:05 PM.  Always use your most recent med list.               ALPRAZolam 1 MG tablet  Commonly known as:  XANAX     CENTRUM SILVER PO  Take by mouth.     cephALEXin 500 MG capsule  Commonly known as:  KEFLEX  Take 1 capsule (500 mg total) by mouth 4 (four) times daily.     cycloSPORINE 0.05 % ophthalmic emulsion  Commonly known as:  RESTASIS  Place 1 drop into both eyes 2 (two) times daily.     dexamethasone 4 MG tablet  Commonly known as:  DECADRON  Take 2 tablets (8 mg total) by mouth 2 (two) times daily. Start the day before Taxotere. Then again the day after chemo for 3 days.     famotidine 20 MG tablet  Commonly known as:  PEPCID  Take 1 tablet (20 mg total) by mouth 2 (two) times daily.     gabapentin 300 MG capsule  Commonly known as:  NEURONTIN  Take 1 capsule (300 mg total) by mouth at bedtime.     lidocaine-prilocaine cream  Commonly known as:  EMLA  Apply to affected area once     loratadine 10 MG tablet  Commonly known as:  CLARITIN  Take 1 tablet (10 mg total) by mouth daily. Daily for 3-5 days starting on neulasta injection day     minocycline 100 MG capsule  Commonly known as:  MINOCIN  Take 1 capsule (100 mg total) by mouth 2 (two) times daily.     nystatin  cream  Commonly known as:  MYCOSTATIN  Apply 1 application topically 2 (two) times daily.     prochlorperazine 10 MG tablet  Commonly known as:  COMPAZINE  Take 1 tablet (10 mg total) by mouth every 6 (six) hours as needed (Nausea or vomiting).     SYSTANE OP  Apply 1 drop to eye 2 (two) times daily.     tobramycin-dexamethasone ophthalmic solution  Commonly known as:  TOBRADEX  Place 2 drops into both eyes every 4 (four) hours while awake.     venlafaxine XR 75 MG 24 hr capsule  Commonly known as:  EFFEXOR-XR  Take 1 capsule (75 mg total) by mouth daily with breakfast.         PHYSICAL EXAMINATION  Oncology Vitals 09/10/2015 08/26/2015 08/19/2015 08/05/2015 07/29/2015 07/15/2015 07/08/2015  Height - 173 cm 173 cm 173 cm - - -  Weight - 80.967 kg 79.198 kg 80.559 kg 81.693 kg 81.829 kg -  Weight (lbs) - 178 lbs 8 oz 174 lbs 10 oz 177 lbs 10 oz  180 lbs 2 oz 180 lbs 6 oz -  BMI (kg/m2) - 27.14 kg/m2 26.55 kg/m2 27 kg/m2 - - -  Temp 97.8 98.2 97.9 98.7 98.4 98.1 98.4  Pulse 95 74 100 77 84 96 90  Resp _0 SpO2 99 99 99 - 97 99 98  BSA (m2) - 1.97 m2 1.95 m2 1.97 m2 - - -   BP Readings from Last 3 Encounters:  09/10/15 126/75  08/26/15 116/59  08/19/15 133/75    Physical Exam  Constitutional: She is oriented to person, place, and time and well-developed, well-nourished, and in no distress.  HENT:  Head: Normocephalic.  Eyes: Conjunctivae and EOM are normal. Pupils are equal, round, and reactive to light. Right eye exhibits no discharge. Left eye exhibits no discharge. No scleral icterus.  Neck: Normal range of motion.  Pulmonary/Chest: Effort normal. No respiratory distress.  Musculoskeletal: Normal range of motion. She exhibits tenderness. She exhibits no edema.  Bilateral anterior knees.  Neurological: She is alert and oriented to person, place, and time.  Skin: Skin is warm and dry.  On exam- the patient has what appears to be road-rash, type abrasions to bilateral anterior knees; and also has healing abrasion to her left chin.  Chin abrasion.  Does appear to be almost completely healed.  Bilateral knee abrasions with mild erythema surrounding the wounds.  There is no specific edema, warmth, or red streaks to the sites.      Nursing note and vitals reviewed.   LABORATORY DATA:. Appointment on 09/10/2015  Component Date Value Ref Range Status  . WBC 09/10/2015 5.3  3.9 - 10.3 10e3/uL Final  . NEUT# 09/10/2015 3.2  1.5 - 6.5 10e3/uL Final  . HGB 09/10/2015 11.7  11.6 - 15.9 g/dL Final  . HCT 09/10/2015 34.6* 34.8 - 46.6 % Final  . Platelets 09/10/2015 122* 145 - 400 10e3/uL Final  . MCV 09/10/2015 109.1* 79.5 - 101.0 fL Final  . MCH 09/10/2015 36.9* 25.1 - 34.0 pg Final  . MCHC 09/10/2015 33.8  31.5 - 36.0 g/dL Final  . RBC 09/10/2015 3.17* 3.70 - 5.45 10e6/uL Final  . RDW 09/10/2015 14.8* 11.2 -  14.5 % Final  . lymph# 09/10/2015 1.6  0.9 - 3.3 10e3/uL Final  . MONO# 09/10/2015 0.5  0.1 - 0.9 10e3/uL Final  . Eosinophils Absolute 09/10/2015 0.0  0.0 - 0.5 10e3/uL Final  . Basophils  Absolute 09/10/2015 0.0  0.0 - 0.1 10e3/uL Final  . NEUT% 09/10/2015 60.4  38.4 - 76.8 % Final  . LYMPH% 09/10/2015 29.8  14.0 - 49.7 % Final  . MONO% 09/10/2015 9.0  0.0 - 14.0 % Final  . EOS% 09/10/2015 0.6  0.0 - 7.0 % Final  . BASO% 09/10/2015 0.2  0.0 - 2.0 % Final     RADIOGRAPHIC STUDIES: No results found.  ASSESSMENT/PLAN:    Fall Patient reports that she tripped over a curb and fell; developing abrasions to her bilateral anterior knees into her left chin.  She denies hitting her head or any loss of consciousness.  She also denies any other complaints.  Following the fall.  Patient is concerned that her bilateral knee abrasions may be slightly infected.  She denies any recent fevers or chills.  Patient states that she is leaving for a vacation in [place] on Monday, 09/13/2015; with like for her wounds to be healed prior to her trip if at all possible.    On exam- the patient has what appears to be road-rash, type abrasions to bilateral anterior knees; and also has healing abrasion to her left chin.  Chin abrasion.  Does appear to be almost completely healed.  Bilateral knee abrasions with mild erythema surrounding the wounds.  There is no specific edema, warmth, or red streaks to the sites.  Bilateral knee abrasions were cleaned and Vaseline gauze dressings applied to both knees.  Per patient's request-we will prescribe Keflex antibiotics in case there is any trace infection to the wounds.  Advised patient that antibiotics may make patient more sun sensitive.  Patient was advised to call/return or go directly to the emergency department for any worsening symptoms whatsoever.  Breast cancer of upper-outer quadrant of left female breast Patient completed her neoadjuvant chemotherapy on  08/26/2015.  She presents to the [facility] today for the Herceptin only portion of her treatment regimen.  She is scheduled for a probable lumpectomy versus mastectomy per Dr. Donne Hazel general surgeon on 09/21/2015.  Following recovery from her surgery.  She is scheduled to initiate radiation treatments.  Patient is scheduled to return on 09/30/2015 for labs and her next Herceptin infusion.  Patient stated understanding of all instructions; and was in agreement with this plan of care. The patient knows to call the clinic with any problems, questions or concerns.   Review/collaboration with Dr. Jana Hakim regarding all aspects of patient's visit today.   Total time spent with patient was 25 minutes;  with greater than 75 percent of that time spent in face to face counseling regarding patient's symptoms,  and coordination of care and follow up.  Disclaimer:This dictation was prepared with Dragon/digital dictation along with [employer]. Any transcriptional errors that result from this process are unintentional.  Drue Second, NP 09/10/2015

## 2015-09-16 ENCOUNTER — Encounter (HOSPITAL_BASED_OUTPATIENT_CLINIC_OR_DEPARTMENT_OTHER): Payer: Self-pay | Admitting: *Deleted

## 2015-09-21 ENCOUNTER — Ambulatory Visit (HOSPITAL_BASED_OUTPATIENT_CLINIC_OR_DEPARTMENT_OTHER): Payer: BLUE CROSS/BLUE SHIELD | Admitting: Anesthesiology

## 2015-09-21 ENCOUNTER — Encounter (HOSPITAL_BASED_OUTPATIENT_CLINIC_OR_DEPARTMENT_OTHER): Payer: Self-pay

## 2015-09-21 ENCOUNTER — Encounter (HOSPITAL_BASED_OUTPATIENT_CLINIC_OR_DEPARTMENT_OTHER)
Admission: RE | Disposition: A | Discharge status: Home / Self Care | Payer: Self-pay | Source: Ambulatory Visit | Attending: General Surgery

## 2015-09-21 ENCOUNTER — Ambulatory Visit (HOSPITAL_COMMUNITY)
Admission: RE | Admit: 2015-09-21 | Discharge: 2015-09-21 | Disposition: A | Discharge status: Home / Self Care | Payer: BLUE CROSS/BLUE SHIELD | Source: Ambulatory Visit | Attending: General Surgery | Admitting: General Surgery

## 2015-09-21 ENCOUNTER — Ambulatory Visit (HOSPITAL_BASED_OUTPATIENT_CLINIC_OR_DEPARTMENT_OTHER)
Admission: RE | Admit: 2015-09-21 | Discharge: 2015-09-21 | Disposition: A | Discharge status: Home / Self Care | Payer: BLUE CROSS/BLUE SHIELD | Source: Ambulatory Visit | Attending: General Surgery | Admitting: General Surgery

## 2015-09-21 DIAGNOSIS — C50912 Malignant neoplasm of unspecified site of left female breast: Secondary | ICD-10-CM | POA: Insufficient documentation

## 2015-09-21 DIAGNOSIS — Z87891 Personal history of nicotine dependence: Secondary | ICD-10-CM | POA: Diagnosis not present

## 2015-09-21 DIAGNOSIS — Z9221 Personal history of antineoplastic chemotherapy: Secondary | ICD-10-CM | POA: Insufficient documentation

## 2015-09-21 DIAGNOSIS — C50412 Malignant neoplasm of upper-outer quadrant of left female breast: Secondary | ICD-10-CM

## 2015-09-21 HISTORY — PX: RADIOACTIVE SEED GUIDED PARTIAL MASTECTOMY WITH AXILLARY SENTINEL LYMPH NODE BIOPSY: SHX6520

## 2015-09-21 SURGERY — RADIOACTIVE SEED GUIDED PARTIAL MASTECTOMY WITH AXILLARY SENTINEL LYMPH NODE BIOPSY
Anesthesia: General | Site: Breast | Laterality: Left

## 2015-09-21 MED ORDER — BUPIVACAINE HCL (PF) 0.25 % IJ SOLN
INTRAMUSCULAR | Status: DC | PRN
  Administered 2015-09-21: 6 mL

## 2015-09-21 MED ORDER — MIDAZOLAM HCL 2 MG/2ML IJ SOLN
1.0000 mg | INTRAMUSCULAR | Status: DC | PRN
  Administered 2015-09-21: 2 mg via INTRAVENOUS

## 2015-09-21 MED ORDER — SODIUM CHLORIDE 0.9 % IJ SOLN
INTRAMUSCULAR | Status: AC
  Filled 2015-09-21: qty 10

## 2015-09-21 MED ORDER — ONDANSETRON HCL 4 MG/2ML IJ SOLN
INTRAMUSCULAR | Status: AC
  Filled 2015-09-21: qty 2

## 2015-09-21 MED ORDER — PROMETHAZINE HCL 25 MG/ML IJ SOLN: 6.2500 mg | INTRAMUSCULAR | Status: DC | PRN

## 2015-09-21 MED ORDER — CEFAZOLIN SODIUM-DEXTROSE 2-3 GM-% IV SOLR
2.0000 g | INTRAVENOUS | Status: AC
  Administered 2015-09-21: 2 g via INTRAVENOUS

## 2015-09-21 MED ORDER — HYDROMORPHONE HCL 1 MG/ML IJ SOLN
INTRAMUSCULAR | Status: AC
  Filled 2015-09-21: qty 1

## 2015-09-21 MED ORDER — MIDAZOLAM HCL 2 MG/2ML IJ SOLN
INTRAMUSCULAR | Status: AC
Start: 2015-09-21 — End: 2015-09-21
  Filled 2015-09-21: qty 2

## 2015-09-21 MED ORDER — SCOPOLAMINE 1 MG/3DAYS TD PT72
1.0000 | MEDICATED_PATCH | Freq: Once | TRANSDERMAL | Status: DC | PRN
  Administered 2015-09-21: 1.5 mg via TRANSDERMAL

## 2015-09-21 MED ORDER — FENTANYL CITRATE (PF) 100 MCG/2ML IJ SOLN
50.0000 ug | INTRAMUSCULAR | Status: AC | PRN
  Administered 2015-09-21: 50 ug via INTRAVENOUS
  Administered 2015-09-21: 100 ug via INTRAVENOUS
  Administered 2015-09-21: 50 ug via INTRAVENOUS

## 2015-09-21 MED ORDER — PROPOFOL 10 MG/ML IV BOLUS
INTRAVENOUS | Status: AC
  Filled 2015-09-21: qty 20

## 2015-09-21 MED ORDER — HYDROMORPHONE HCL 1 MG/ML IJ SOLN
0.2500 mg | INTRAMUSCULAR | Status: DC | PRN
  Administered 2015-09-21: 0.5 mg via INTRAVENOUS

## 2015-09-21 MED ORDER — SUCCINYLCHOLINE CHLORIDE 20 MG/ML IJ SOLN
INTRAMUSCULAR | Status: AC
  Filled 2015-09-21: qty 1

## 2015-09-21 MED ORDER — FENTANYL CITRATE (PF) 100 MCG/2ML IJ SOLN
INTRAMUSCULAR | Status: AC
  Filled 2015-09-21: qty 2

## 2015-09-21 MED ORDER — SCOPOLAMINE 1 MG/3DAYS TD PT72
MEDICATED_PATCH | TRANSDERMAL | Status: AC
  Filled 2015-09-21: qty 1

## 2015-09-21 MED ORDER — LACTATED RINGERS IV SOLN
INTRAVENOUS | Status: DC
  Administered 2015-09-21 (×2): via INTRAVENOUS

## 2015-09-21 MED ORDER — MIDAZOLAM HCL 2 MG/2ML IJ SOLN: 0.5000 mg | Freq: Once | INTRAMUSCULAR | Status: DC | PRN

## 2015-09-21 MED ORDER — FENTANYL CITRATE (PF) 100 MCG/2ML IJ SOLN
INTRAMUSCULAR | Status: AC
  Filled 2015-09-21: qty 4

## 2015-09-21 MED ORDER — DEXAMETHASONE SODIUM PHOSPHATE 10 MG/ML IJ SOLN
INTRAMUSCULAR | Status: AC
  Filled 2015-09-21: qty 1

## 2015-09-21 MED ORDER — BUPIVACAINE-EPINEPHRINE (PF) 0.5% -1:200000 IJ SOLN
INTRAMUSCULAR | Status: DC | PRN
  Administered 2015-09-21: 30 mL

## 2015-09-21 MED ORDER — GLYCOPYRROLATE 0.2 MG/ML IJ SOLN: 0.2000 mg | Freq: Once | INTRAMUSCULAR | Status: DC | PRN

## 2015-09-21 MED ORDER — OXYCODONE-ACETAMINOPHEN 10-325 MG PO TABS: 1.0000 | ORAL_TABLET | Freq: Four times a day (QID) | ORAL | Status: DC | PRN

## 2015-09-21 MED ORDER — BUPIVACAINE HCL (PF) 0.25 % IJ SOLN
INTRAMUSCULAR | Status: AC
  Filled 2015-09-21: qty 30

## 2015-09-21 MED ORDER — DEXAMETHASONE SODIUM PHOSPHATE 4 MG/ML IJ SOLN
INTRAMUSCULAR | Status: DC | PRN
  Administered 2015-09-21: 10 mg via INTRAVENOUS

## 2015-09-21 MED ORDER — DIPHENHYDRAMINE HCL 50 MG/ML IJ SOLN
INTRAMUSCULAR | Status: DC | PRN
  Administered 2015-09-21: 12.5 mg via INTRAVENOUS

## 2015-09-21 MED ORDER — LIDOCAINE HCL (CARDIAC) 20 MG/ML IV SOLN
INTRAVENOUS | Status: AC
  Filled 2015-09-21: qty 5

## 2015-09-21 MED ORDER — GLYCOPYRROLATE 0.2 MG/ML IJ SOLN
INTRAMUSCULAR | Status: AC
  Filled 2015-09-21: qty 1

## 2015-09-21 MED ORDER — LIDOCAINE HCL (CARDIAC) 20 MG/ML IV SOLN
INTRAVENOUS | Status: DC | PRN
  Administered 2015-09-21: 30 mg via INTRAVENOUS

## 2015-09-21 MED ORDER — MEPERIDINE HCL 25 MG/ML IJ SOLN: 6.2500 mg | INTRAMUSCULAR | Status: DC | PRN

## 2015-09-21 MED ORDER — CEFAZOLIN SODIUM-DEXTROSE 2-3 GM-% IV SOLR
INTRAVENOUS | Status: AC
  Filled 2015-09-21: qty 50

## 2015-09-21 MED ORDER — SODIUM CHLORIDE 0.9 % IJ SOLN
INTRAMUSCULAR | Status: DC | PRN
  Administered 2015-09-21: 10:00:00

## 2015-09-21 MED ORDER — PROPOFOL 10 MG/ML IV BOLUS
INTRAVENOUS | Status: DC | PRN
  Administered 2015-09-21: 200 mg via INTRAVENOUS

## 2015-09-21 MED ORDER — METHYLENE BLUE 1 % INJ SOLN
INTRAMUSCULAR | Status: AC
  Filled 2015-09-21: qty 10

## 2015-09-21 SURGICAL SUPPLY — 62 items
APPLIER CLIP 9.375 MED OPEN (MISCELLANEOUS) ×2
BINDER BREAST LRG (GAUZE/BANDAGES/DRESSINGS) ×2 IMPLANT
BINDER BREAST MEDIUM (GAUZE/BANDAGES/DRESSINGS) IMPLANT
BINDER BREAST XLRG (GAUZE/BANDAGES/DRESSINGS) IMPLANT
BINDER BREAST XXLRG (GAUZE/BANDAGES/DRESSINGS) IMPLANT
BLADE SURG 15 STRL LF DISP TIS (BLADE) ×1 IMPLANT
BLADE SURG 15 STRL SS (BLADE) ×1
CANISTER SUC SOCK COL 7IN (MISCELLANEOUS) IMPLANT
CANISTER SUCT 1200ML W/VALVE (MISCELLANEOUS) IMPLANT
CHLORAPREP W/TINT 26ML (MISCELLANEOUS) ×2 IMPLANT
CLIP APPLIE 9.375 MED OPEN (MISCELLANEOUS) ×1 IMPLANT
COVER BACK TABLE 60X90IN (DRAPES) ×2 IMPLANT
COVER MAYO STAND STRL (DRAPES) ×2 IMPLANT
COVER PROBE W GEL 5X96 (DRAPES) ×2 IMPLANT
DECANTER SPIKE VIAL GLASS SM (MISCELLANEOUS) IMPLANT
DEVICE DUBIN W/COMP PLATE 8390 (MISCELLANEOUS) ×2 IMPLANT
DRAPE LAPAROSCOPIC ABDOMINAL (DRAPES) ×2 IMPLANT
DRAPE UTILITY XL STRL (DRAPES) ×2 IMPLANT
DRSG TEGADERM 4X4.75 (GAUZE/BANDAGES/DRESSINGS) IMPLANT
ELECT COATED BLADE 2.86 ST (ELECTRODE) ×2 IMPLANT
ELECT REM PT RETURN 9FT ADLT (ELECTROSURGICAL) ×2
ELECTRODE REM PT RTRN 9FT ADLT (ELECTROSURGICAL) ×1 IMPLANT
GLOVE BIO SURGEON STRL SZ 6.5 (GLOVE) ×4 IMPLANT
GLOVE BIO SURGEON STRL SZ7 (GLOVE) ×4 IMPLANT
GLOVE BIOGEL PI IND STRL 7.0 (GLOVE) ×1 IMPLANT
GLOVE BIOGEL PI IND STRL 7.5 (GLOVE) ×1 IMPLANT
GLOVE BIOGEL PI INDICATOR 7.0 (GLOVE) ×1
GLOVE BIOGEL PI INDICATOR 7.5 (GLOVE) ×1
GOWN STRL REUS W/ TWL LRG LVL3 (GOWN DISPOSABLE) ×2 IMPLANT
GOWN STRL REUS W/TWL LRG LVL3 (GOWN DISPOSABLE) ×2
ILLUMINATOR WAVEGUIDE N/F (MISCELLANEOUS) ×2 IMPLANT
KIT MARKER MARGIN INK (KITS) ×2 IMPLANT
LIGHT WAVEGUIDE WIDE FLAT (MISCELLANEOUS) IMPLANT
LIQUID BAND (GAUZE/BANDAGES/DRESSINGS) ×2 IMPLANT
MARKER SKIN DUAL TIP RULER LAB (MISCELLANEOUS) ×4 IMPLANT
NDL SAFETY ECLIPSE 18X1.5 (NEEDLE) IMPLANT
NEEDLE HYPO 18GX1.5 SHARP (NEEDLE)
NEEDLE HYPO 25X1 1.5 SAFETY (NEEDLE) ×2 IMPLANT
NS IRRIG 1000ML POUR BTL (IV SOLUTION) IMPLANT
PACK BASIN DAY SURGERY FS (CUSTOM PROCEDURE TRAY) ×2 IMPLANT
PENCIL BUTTON HOLSTER BLD 10FT (ELECTRODE) ×2 IMPLANT
SHEET MEDIUM DRAPE 40X70 STRL (DRAPES) IMPLANT
SLEEVE SCD COMPRESS KNEE MED (MISCELLANEOUS) ×2 IMPLANT
SPONGE GAUZE 4X4 12PLY STER LF (GAUZE/BANDAGES/DRESSINGS) ×2 IMPLANT
SPONGE LAP 4X18 X RAY DECT (DISPOSABLE) ×2 IMPLANT
STAPLER VISISTAT 35W (STAPLE) ×2 IMPLANT
STOCKINETTE IMPERVIOUS LG (DRAPES) IMPLANT
STRIP CLOSURE SKIN 1/2X4 (GAUZE/BANDAGES/DRESSINGS) ×2 IMPLANT
SUT ETHILON 2 0 FS 18 (SUTURE) IMPLANT
SUT MNCRL AB 4-0 PS2 18 (SUTURE) ×2 IMPLANT
SUT MON AB 5-0 PS2 18 (SUTURE) IMPLANT
SUT SILK 2 0 SH (SUTURE) IMPLANT
SUT VIC AB 2-0 SH 27 (SUTURE) ×1
SUT VIC AB 2-0 SH 27XBRD (SUTURE) ×1 IMPLANT
SUT VIC AB 3-0 SH 27 (SUTURE) ×1
SUT VIC AB 3-0 SH 27X BRD (SUTURE) ×1 IMPLANT
SUT VIC AB 5-0 PS2 18 (SUTURE) IMPLANT
SYR CONTROL 10ML LL (SYRINGE) ×2 IMPLANT
TOWEL OR 17X24 6PK STRL BLUE (TOWEL DISPOSABLE) ×2 IMPLANT
TOWEL OR NON WOVEN STRL DISP B (DISPOSABLE) ×2 IMPLANT
TUBE CONNECTING 20X1/4 (TUBING) ×2 IMPLANT
YANKAUER SUCT BULB TIP NO VENT (SUCTIONS) IMPLANT

## 2015-09-21 NOTE — Op Note (Signed)
Preoperative diagnosis: clinical stage I left breast cancer s/p primary chemotherapy Postoperative diagnosis: Same as above Procedure: #1 left breast radioactive seed guided lumpectomy #2 left axillary sentinel lymph node biopsy #3 injection blue dye for sentinel node identification Surgeon: Dr. Serita Grammes Anesthesia: Gen. With pectoral block Specimens: #1 left breast tissue containing radioactive seed and wire marked with paint #2 left axillary sentinel lymph nodes with highest count of 119 Complications: None Drains: None Estimated blood loss: Minimal Sponge and needle count was correct at completion Disposition to recovery stable  Indications: This is a 33 yof with diagnosed left breast cancer who has undergone primary chemotherapy. We discussed options and decided to proceed with lumpectomy/sn biopsy. She had radioactive seed placed prior to beginning. I had these mammograms available for review.   Procedure: After informed consent was obtained the patient first underwent a pectoral block as well as injection of technetium in a standard periareolar fashion.  I had her mammograms in the operating room. She was then placed under general anesthesia without complication. Her breast and axilla were then prepped and draped in the standard sterile surgical fashion. A surgical timeout was then performed. I injected a mixture of saline/methylene blue dye around the areola and massaged this.  I located the radioactive seed in the upper portion of the breast. I infiltrated local anesthetic at the incision.  I made an incision near the axilla. I tunneled over the pectoralis muscle and identified the seed. I used the lighted retractors.I then was able to excise the radioactive seed and the surrounding tissue with an attempt to get a clear margin Hemostasis was then observed. I did a mammogram which confirmed removal of both the clip and the radioactive seed. I confirmed seed removal with the  neoprobe as well. There was no background radioactivity. I then placed clips around this cavity. I closed the cavity with 2-0 Vicryl. I closed the skin with 3-0 vicryl and 4-0 monocryl.  Glue was placed.  I made an axillary incision and then located the sentinel node. I went through the axillary fascia. I then located several nodes some of which were blue and these were removed. There was no background radioactivity. These were passed off as well. I closed the axillary fascia with 2-0 Vicryl. The skin was closed with 3-0 Vicryl and 4-0 Monocryl. Glue and Steri-Strips were placed. A binder was placed. She tolerated this well. She was extubated and transferred to recovery in stable condition.

## 2015-09-21 NOTE — Progress Notes (Signed)
Assisted Dr. Jackson with left, ultrasound guided, pectoralis block. Side rails up, monitors on throughout procedure. See vital signs in flow sheet. Tolerated Procedure well. 

## 2015-09-21 NOTE — Anesthesia Postprocedure Evaluation (Signed)
  Anesthesia Post-op Note  Patient: Cynthia [name]  Procedure(s) Performed: Procedure(s): RADIOACTIVE SEED GUIDED LUMPECTOMY WITH AXILLARY SENTINEL LYMPH NODE BIOPSY (Left)  Patient Location: PACU  Anesthesia Type:GA combined with regional for post-op pain  Level of Consciousness: awake, alert , oriented and patient cooperative  Airway and Oxygen Therapy: Patient Spontanous Breathing  Post-op Pain: none  Post-op Assessment: Post-op Vital signs reviewed, Patient's Cardiovascular Status Stable, Respiratory Function Stable, Patent Airway and Pain level controlled, nausea improved              Post-op Vital Signs: Reviewed and stable  Last Vitals:  Filed Vitals:   09/21/15 1200  BP: 125/69  Pulse: 75  Temp:   Resp: 5    Complications: No apparent anesthesia complications

## 2015-09-21 NOTE — Anesthesia Preprocedure Evaluation (Addendum)
Anesthesia Evaluation  Patient identified by MRN, date of birth, ID band Patient awake    Reviewed: Allergy & Precautions, NPO status , Patient's Chart, lab work & pertinent test results  History of Anesthesia Complications Negative for: history of anesthetic complications  Airway Mallampati: II  TM Distance: >3 FB Neck ROM: Full    Dental  (+) Teeth Intact, Dental Advisory Given   Pulmonary former smoker,    breath sounds clear to auscultation       Cardiovascular (-) anginanegative cardio ROS   Rhythm:Regular Rate:Normal  8/16 ECHO: EF 55-60%, valves OK   Neuro/Psych  Headaches,    GI/Hepatic negative GI ROS, Neg liver ROS,   Endo/Other  negative endocrine ROS  Renal/GU negative Renal ROS     Musculoskeletal   Abdominal   Peds  Hematology  (+) Blood dyscrasia (s/p chemo), ,   Anesthesia Other Findings Breast cancer: chemo  Reproductive/Obstetrics                           Anesthesia Physical Anesthesia Plan  ASA: II  Anesthesia Plan: General   Post-op Pain Management: GA combined w/ Regional for post-op pain   Induction: Intravenous  Airway Management Planned: LMA  Additional Equipment:   Intra-op Plan:   Post-operative Plan:   Informed Consent: I have reviewed the patients History and Physical, chart, labs and discussed the procedure including the risks, benefits and alternatives for the proposed anesthesia with the patient or authorized representative who has indicated his/her understanding and acceptance.   Dental advisory given  Plan Discussed with: CRNA and Surgeon  Anesthesia Plan Comments: (Plan routine monitors, GA- LMA OK, pec block for post op analgesia)        Anesthesia Quick Evaluation

## 2015-09-21 NOTE — Discharge Instructions (Signed)
[facility] °Office Phone Number [phone] ° °POST OP INSTRUCTIONS ° °Always review your discharge instruction sheet given to you by the facility where your surgery was performed. ° °IF YOU HAVE DISABILITY OR FAMILY LEAVE FORMS, YOU MUST BRING THEM TO THE OFFICE FOR PROCESSING.  DO NOT GIVE THEM TO YOUR DOCTOR. ° °1. A prescription for pain medication may be given to you upon discharge.  Take your pain medication as prescribed, if needed.  If narcotic pain medicine is not needed, then you may take acetaminophen (Tylenol), naprosyn (Alleve) or ibuprofen (Advil) as needed. °2. Take your usually prescribed medications unless otherwise directed °3. If you need a refill on your pain medication, please contact your pharmacy.  They will contact our office to request authorization.  Prescriptions will not be filled after 5pm or on week-ends. °4. You should eat very light the first 24 hours after surgery, such as soup, crackers, pudding, etc.  Resume your normal diet the day after surgery. °5. Most patients will experience some swelling and bruising in the breast.  Ice packs and a good support bra will help.  Wear the breast binder provided or a sports bra for 72 hours day and night.  After that wear a sports bra during the day until you return to the office. Swelling and bruising can take several days to resolve.  °6. It is common to experience some constipation if taking pain medication after surgery.  Increasing fluid intake and taking a stool softener will usually help or prevent this problem from occurring.  A mild laxative (Milk of Magnesia or Miralax) should be taken according to package directions if there are no bowel movements after 48 hours. °7. Unless discharge instructions indicate otherwise, you may remove your bandages 48 hours after surgery and you may shower at that time.  You may have steri-strips (small skin tapes) in place directly over the incision.  These strips should be left on the  skin for 7-10 days and will come off on their own.  If your surgeon used skin glue on the incision, you may shower in 24 hours.  The glue will flake off over the next 2-3 weeks.  Any sutures or staples will be removed at the office during your follow-up visit. °8. ACTIVITIES:  You may resume regular daily activities (gradually increasing) beginning the next day.  Wearing a good support bra or sports bra minimizes pain and swelling.  You may have sexual intercourse when it is comfortable. °a. You may drive when you no longer are taking prescription pain medication, you can comfortably wear a seatbelt, and you can safely maneuver your car and apply brakes. °b. RETURN TO WORK:  ______________________________________________________________________________________ °9. You should see your doctor in the office for a follow-up appointment approximately two weeks after your surgery.  Your doctor’s nurse will typically make your follow-up appointment when she calls you with your pathology report.  Expect your pathology report 3-4 business days after your surgery.  You may call to check if you do not hear from us after three days. °10. OTHER INSTRUCTIONS: _______________________________________________________________________________________________ _____________________________________________________________________________________________________________________________________ °_____________________________________________________________________________________________________________________________________ °_____________________________________________________________________________________________________________________________________ ° °WHEN TO CALL DR WAKEFIELD: °1. Fever over 101.0 °2. Nausea and/or vomiting. °3. Extreme swelling or bruising. °4. Continued bleeding from incision. °5. Increased pain, redness, or drainage from the incision. ° °The clinic staff is available to answer your questions during regular  business hours.  Please don’t hesitate to call and ask to speak to one of the nurses for clinical concerns.  If   you have a medical emergency, go to the nearest emergency room or call 911.  A surgeon from [facility] is always on call at the hospital. ° °For further questions, please visit [email or web address] ° ° ° °Post Anesthesia Home Care Instructions ° °Activity: °Get plenty of rest for the remainder of the day. A responsible adult should stay with you for 24 hours following the procedure.  °For the next 24 hours, DO NOT: °-Drive a car °-Operate machinery °-Drink alcoholic beverages °-Take any medication unless instructed by your physician °-Make any legal decisions or sign important papers. ° °Meals: °Start with liquid foods such as gelatin or soup. Progress to regular foods as tolerated. Avoid greasy, spicy, heavy foods. If nausea and/or vomiting occur, drink only clear liquids until the nausea and/or vomiting subsides. Call your physician if vomiting continues. ° °Special Instructions/Symptoms: °Your throat may feel dry or sore from the anesthesia or the breathing tube placed in your throat during surgery. If this causes discomfort, gargle with warm salt water. The discomfort should disappear within 24 hours. ° °If you had a scopolamine patch placed behind your ear for the management of post- operative nausea and/or vomiting: ° °1. The medication in the patch is effective for 72 hours, after which it should be removed.  Wrap patch in a tissue and discard in the trash. Wash hands thoroughly with soap and water. °2. You may remove the patch earlier than 72 hours if you experience unpleasant side effects which may include dry mouth, dizziness or visual disturbances. °3. Avoid touching the patch. Wash your hands with soap and water after contact with the patch. °  ° °

## 2015-09-21 NOTE — H&P (Signed)
49 yof who presented in April with a 1.7x1.7x1.8 cm mri tumor that was clinically and radiologically node negative. this was 2 cm mm mass and 4 mm nodule adjacent to this. 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 she stopped perjeta after first dose but has completed [place]. she has overall done well. she couldnt feel this area over a month ago. She underwent repeat mri that shows no residual disease. she is here today to discuss options. She was discussed to go to genetics but has not been.   Past Surgical History Elbert Ewings, CMA; 08/23/2015 11:15 AM) Breast Biopsy Left.  Diagnostic Studies History Elbert Ewings, [place]; 08/23/2015 11:15 AM) Colonoscopy never Mammogram within last year Pap Smear 1-5 years ago  Allergies Elbert Ewings, CMA; 08/23/2015 11:15 AM) Zofran *ANTIEMETICS* Adhesive Tape *MEDICAL DEVICES AND SUPPLIES*  Medication History Elbert Ewings, CMA; 08/23/2015 11:17 AM) ALPRAZolam (1MG  Tablet, Oral) Active. Restasis (0.05% Emulsion, Ophthalmic) Active. Decadron (4MG  Tablet, Oral) Active. Lidocaine (4% Lotion, External) Active. Gabapentin (300MG  Tablet, Oral) Active. Minocin (100MG  Capsule, Oral) Active. Multiple Vitamin (Oral) Active. Compazine (10MG  Tablet, Oral) Active. Venlafaxine HCl ER (75MG  Tablet ER 24HR, Oral) Active. Medications Reconciled  Social History Elbert Ewings, [place]; 08/23/2015 11:15 AM) Alcohol use Occasional alcohol use. Caffeine use Carbonated beverages, Tea. No drug use Tobacco use Former smoker.  Family History Elbert Ewings, [place]; 08/23/2015 11:15 AM) Depression Father. Melanoma Family Members In General. Migraine Headache Mother.  Pregnancy / Birth History Elbert Ewings, CMA; 08/23/2015 11:15 AM) Age at menarche 49 years. Contraceptive History Contraceptive implant, Intrauterine device, Oral contraceptives. Gravida 5 Irregular periods Maternal age 50-25 Para 3  Review of Systems Elbert Ewings CMA; 08/23/2015 11:15 AM) General Present- Chills, Fatigue and Night Sweats. Not Present- Appetite Loss, Fever, Weight Gain and Weight Loss. Skin Present- New Lesions. Not Present- Change in Wart/Mole, Dryness, Hives, Jaundice, Non-Healing Wounds, Rash and Ulcer. HEENT Present- Ringing in the Ears. Not Present- Earache, Hearing Loss, Hoarseness, Nose Bleed, Oral Ulcers, Seasonal Allergies, Sinus Pain, Sore Throat, Visual Disturbances, Wears glasses/contact lenses and Yellow Eyes. Respiratory Not Present- Bloody sputum, Chronic Cough, Difficulty Breathing, Snoring and Wheezing. Breast Present- Breast Mass. Not Present- Breast Pain, Nipple Discharge and Skin Changes. Cardiovascular Present- Rapid Heart Rate and Shortness of Breath. Not Present- Chest Pain, Difficulty Breathing Lying Down, Leg Cramps, Palpitations and Swelling of Extremities. Gastrointestinal Present- Nausea. Not Present- Abdominal Pain, Bloating, Bloody Stool, Change in Bowel Habits, Chronic diarrhea, Constipation, Difficulty Swallowing, Excessive gas, Gets full quickly at meals, Hemorrhoids, Indigestion, Rectal Pain and Vomiting. Female Genitourinary Not Present- Frequency, Nocturia, Painful Urination, Pelvic Pain and Urgency. Musculoskeletal Not Present- Back Pain, Joint Pain, Joint Stiffness, Muscle Pain, Muscle Weakness and Swelling of Extremities. Neurological Present- Decreased Memory and Weakness. Not Present- Fainting, Headaches, Numbness, Seizures, Tingling, Tremor and Trouble walking. Psychiatric Present- Anxiety, Change in Sleep Pattern, Depression and Frequent crying. Not Present- Bipolar and Fearful. Endocrine Present- Hot flashes. Not Present- Cold Intolerance, Excessive Hunger, Hair Changes, Heat Intolerance and New Diabetes. Hematology Not Present- Easy Bruising, Excessive bleeding, Gland problems, HIV and Persistent Infections.   Vitals Elbert Ewings CMA; 08/23/2015 11:17 AM) 08/23/2015 11:17 AM Weight: 177 lb  Height: 68in Body Surface Area: 1.96 m Body Mass Index: 26.91 kg/m Temp.: 97.73F(Temporal)  Pulse: 89 (Regular)  BP: 122/72 (Sitting, Left Arm, Standard)    Physical Exam Rolm Bookbinder MD; 08/23/2015 1:31 PM) General Mental Status-Alert. Orientation-Oriented X3.  Chest and Lung Exam Chest and lung exam reveals -on auscultation,  normal breath sounds, no adventitious sounds and normal vocal resonance.  Breast Nipples-No Discharge. Breast Lump-No Palpable Breast Mass.  Cardiovascular Cardiovascular examination reveals -normal heart sounds, regular rate and rhythm with no murmurs.  Lymphatic Head & Neck  General Head & Neck Lymphatics: Bilateral - Description - Normal. Axillary  General Axillary Region: Bilateral - Description - Normal.    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 We discussed all of the different options for treatment for breast cancer including surgery, chemotherapy, radiation therapy, Herceptin, and antiestrogen therapy. We discussed a sentinel lymph node biopsy as she still does not appear to having lymph node involvement right now. We discussed the performance of that with injection of radioactive tracer and blue dye. We discussed that she would have an incision underneath her axillary hairline. We discussed that there is a chance of having a positive node with a sentinel lymph node biopsy. We discussed up to a 5% risk lifetime of chronic shoulder pain as well as lymphedema associated with a sentinel lymph node biopsy. We discussed the options for treatment of the breast cancer which included lumpectomy versus a mastectomy. We discussed the performance of the lumpectomy with radioactive seed  placement. We discussed a 5-10% chance of a positive margin requiring reexcision in the operating room. We also discussed that she will likely need radiation therapy (this is usually 5-7 weeks) if she undergoes lumpectomy. We discussed the mastectomy (removal of whole breast) and the postoperative care for that as well. Mastectomy can be followed by reconstruction. The decision for lumpectomy vs mastectomy has no impact on decision for chemotherapy. Most mastectomy patients will not need radiation therapy. We discussed that there is no difference in her survival whether she undergoes lumpectomy with radiation therapy or antiestrogen therapy versus a mastectomy. There is also no real difference between her recurrence in the breast.

## 2015-09-21 NOTE — Interval H&P Note (Signed)
History and Physical Interval Note:  09/21/2015 9:36 AM  Delton See  has presented today for surgery, with the diagnosis of BREAST CANCER  The various methods of treatment have been discussed with the patient and family. After consideration of risks, benefits and other options for treatment, the patient has consented to  Procedure(s): RADIOACTIVE SEED GUIDED LUMPECTOMY WITH AXILLARY SENTINEL LYMPH NODE BIOPSY (Left) as a surgical intervention .  The patient's history has been reviewed, patient examined, no change in status, stable for surgery.  I have reviewed the patient's chart and labs.  Questions were answered to the patient's satisfaction.     WAKEFIELD,MATTHEW

## 2015-09-21 NOTE — Anesthesia Procedure Notes (Addendum)
Anesthesia Regional Block:  Pectoralis block  Pre-Anesthetic Checklist: ,, timeout performed, Correct Patient, Correct Site, Correct Laterality, Correct Procedure, Correct Position, site marked, Risks and benefits discussed,  Surgical consent,  Pre-op evaluation,  At surgeon's request and post-op pain management  Laterality: Left  Prep: chloraprep       Needles:   Needle Type: Echogenic Stimulator Needle     Needle Length: 9cm 9 cm Needle Gauge: 21 and 21 G    Additional Needles:  Procedures: ultrasound guided (picture in chart) Pectoralis block Narrative:  Start time: 09/21/2015 9:16 AM End time: 09/21/2015 9:25 AM Injection made incrementally with aspirations every 5 mL.  Performed by: Personally  Anesthesiologist: Glennon Mac, [place]  Additional Notes: Pt identified in Holding room.  Monitors applied. Working IV access confirmed. Sterile prep, drape L pectoralis.  #21ga ECHO)genic needle into sub-pec minor space between ribs 3,4 with US guidance.  25cc 0.5% Bupivacaine with 1:200k epi injected incrementally after negative test dose, good spread of local anesthetic, additional 5cc injected between pec major and minor .  Patient asymptomatic, VSS, no heme aspirated, tolerated well.  Jenita Seashore, MD (Prudencio Pair, MD)   Procedure Name: LMA Insertion Date/Time: 09/21/2015 9:58 AM Performed by: Lyndee Leo Pre-anesthesia Checklist: Patient identified, Emergency Drugs available, Suction available and Patient being monitored Patient Re-evaluated:Patient Re-evaluated prior to inductionOxygen Delivery Method: Circle System Utilized Preoxygenation: Pre-oxygenation with 100% oxygen Intubation Type: IV induction Ventilation: Mask ventilation without difficulty LMA: LMA inserted LMA Size: 4.0 Number of attempts: 1 Airway Equipment and Method: Bite block Placement Confirmation: positive ETCO2 Tube secured with: Tape Dental Injury: Teeth and Oropharynx as per pre-operative  assessment

## 2015-09-21 NOTE — Transfer of Care (Signed)
Immediate Anesthesia Transfer of Care Note  Patient: Cynthia [name]  Procedure(s) Performed: Procedure(s): RADIOACTIVE SEED GUIDED LUMPECTOMY WITH AXILLARY SENTINEL LYMPH NODE BIOPSY (Left)  Patient Location: PACU  Anesthesia Type:GA combined with regional for post-op pain  Level of Consciousness: awake, sedated and patient cooperative  Airway & Oxygen Therapy: Patient Spontanous Breathing and Patient connected to face mask oxygen  Post-op Assessment: Report given to RN and Post -op Vital signs reviewed and stable  Post vital signs: Reviewed and stable  Last Vitals:  Filed Vitals:   09/21/15 0945  BP: 130/70  Pulse: 92  Resp: 14    Complications: No apparent anesthesia complications

## 2015-09-22 ENCOUNTER — Other Ambulatory Visit: Payer: BLUE CROSS/BLUE SHIELD

## 2015-09-22 ENCOUNTER — Ambulatory Visit: Payer: BLUE CROSS/BLUE SHIELD | Admitting: Oncology

## 2015-09-22 ENCOUNTER — Encounter (HOSPITAL_BASED_OUTPATIENT_CLINIC_OR_DEPARTMENT_OTHER): Payer: Self-pay | Admitting: General Surgery

## 2015-09-23 ENCOUNTER — Other Ambulatory Visit: Payer: Self-pay | Admitting: General Surgery

## 2015-09-23 DIAGNOSIS — C50412 Malignant neoplasm of upper-outer quadrant of left female breast: Secondary | ICD-10-CM

## 2015-09-30 ENCOUNTER — Other Ambulatory Visit: Payer: Self-pay

## 2015-09-30 ENCOUNTER — Encounter (HOSPITAL_COMMUNITY): Payer: Self-pay

## 2015-09-30 ENCOUNTER — Ambulatory Visit (HOSPITAL_BASED_OUTPATIENT_CLINIC_OR_DEPARTMENT_OTHER): Payer: BLUE CROSS/BLUE SHIELD

## 2015-09-30 ENCOUNTER — Other Ambulatory Visit (HOSPITAL_BASED_OUTPATIENT_CLINIC_OR_DEPARTMENT_OTHER): Payer: BLUE CROSS/BLUE SHIELD

## 2015-09-30 VITALS — BP 126/82 | HR 93 | Temp 98.5°F

## 2015-09-30 DIAGNOSIS — C50412 Malignant neoplasm of upper-outer quadrant of left female breast: Secondary | ICD-10-CM

## 2015-09-30 DIAGNOSIS — G43009 Migraine without aura, not intractable, without status migrainosus: Secondary | ICD-10-CM

## 2015-09-30 DIAGNOSIS — Z5112 Encounter for antineoplastic immunotherapy: Secondary | ICD-10-CM

## 2015-09-30 LAB — CBC WITH DIFFERENTIAL/PLATELET
BASO%: 0.4 % (ref 0.0–2.0)
Basophils Absolute: 0 10*3/uL (ref 0.0–0.1)
EOS ABS: 0.2 10*3/uL (ref 0.0–0.5)
EOS%: 3.2 % (ref 0.0–7.0)
HCT: 37.7 % (ref 34.8–46.6)
HGB: 12.5 g/dL (ref 11.6–15.9)
LYMPH%: 27.9 % (ref 14.0–49.7)
MCH: 35.9 pg — AB (ref 25.1–34.0)
MCHC: 33.2 g/dL (ref 31.5–36.0)
MCV: 108.3 fL — AB (ref 79.5–101.0)
MONO#: 0.5 10*3/uL (ref 0.1–0.9)
MONO%: 10.8 % (ref 0.0–14.0)
NEUT%: 57.7 % (ref 38.4–76.8)
NEUTROS ABS: 2.9 10*3/uL (ref 1.5–6.5)
Platelets: 202 10*3/uL (ref 145–400)
RBC: 3.48 10*6/uL — AB (ref 3.70–5.45)
RDW: 13.1 % (ref 11.2–14.5)
WBC: 5 10*3/uL (ref 3.9–10.3)
lymph#: 1.4 10*3/uL (ref 0.9–3.3)

## 2015-09-30 LAB — COMPREHENSIVE METABOLIC PANEL (CC13)
ALT: 13 U/L (ref 0–55)
AST: 15 U/L (ref 5–34)
Albumin: 3.3 g/dL — ABNORMAL LOW (ref 3.5–5.0)
Alkaline Phosphatase: 101 U/L (ref 40–150)
Anion Gap: 7 mEq/L (ref 3–11)
BUN: 15.2 mg/dL (ref 7.0–26.0)
CO2: 27 meq/L (ref 22–29)
Calcium: 9.2 mg/dL (ref 8.4–10.4)
Chloride: 107 mEq/L (ref 98–109)
Creatinine: 0.7 mg/dL (ref 0.6–1.1)
GLUCOSE: 78 mg/dL (ref 70–140)
POTASSIUM: 4 meq/L (ref 3.5–5.1)
SODIUM: 141 meq/L (ref 136–145)
TOTAL PROTEIN: 6.1 g/dL — AB (ref 6.4–8.3)
Total Bilirubin: 0.3 mg/dL (ref 0.20–1.20)

## 2015-09-30 MED ORDER — SODIUM CHLORIDE 0.9 % IJ SOLN
10.0000 mL | INTRAMUSCULAR | Status: DC | PRN
  Administered 2015-09-30: 10 mL
  Filled 2015-09-30: qty 10

## 2015-09-30 MED ORDER — ACETAMINOPHEN 325 MG PO TABS
ORAL_TABLET | ORAL | Status: AC
  Filled 2015-09-30: qty 2

## 2015-09-30 MED ORDER — HEPARIN SOD (PORK) LOCK FLUSH 100 UNIT/ML IV SOLN
500.0000 [IU] | Freq: Once | INTRAVENOUS | Status: AC | PRN
  Administered 2015-09-30: 500 [IU]
  Filled 2015-09-30: qty 5

## 2015-09-30 MED ORDER — DIPHENHYDRAMINE HCL 25 MG PO CAPS
25.0000 mg | ORAL_CAPSULE | Freq: Once | ORAL | Status: AC
Start: 2015-09-30 — End: 2015-09-30
  Administered 2015-09-30: 25 mg via ORAL

## 2015-09-30 MED ORDER — DIPHENHYDRAMINE HCL 25 MG PO CAPS
ORAL_CAPSULE | ORAL | Status: AC
  Filled 2015-09-30: qty 1

## 2015-09-30 MED ORDER — SODIUM CHLORIDE 0.9 % IV SOLN
Freq: Once | INTRAVENOUS | Status: AC
  Administered 2015-09-30: 11:00:00 via INTRAVENOUS

## 2015-09-30 MED ORDER — ACETAMINOPHEN 325 MG PO TABS
650.0000 mg | ORAL_TABLET | Freq: Once | ORAL | Status: AC
  Administered 2015-09-30: 650 mg via ORAL

## 2015-09-30 MED ORDER — TRASTUZUMAB CHEMO INJECTION 440 MG
6.0000 mg/kg | Freq: Once | INTRAVENOUS | Status: AC
  Administered 2015-09-30: 483 mg via INTRAVENOUS
  Filled 2015-09-30: qty 23

## 2015-09-30 NOTE — Patient Instructions (Signed)
[facility] Discharge Instructions for Patients Receiving Chemotherapy  Today you received the following chemotherapy agents: Herceptin   To help prevent nausea and vomiting after your treatment, we encourage you to take your nausea medication as directed.    If you develop nausea and vomiting that is not controlled by your nausea medication, call the clinic.   BELOW ARE SYMPTOMS THAT SHOULD BE REPORTED IMMEDIATELY:  *FEVER GREATER THAN 100.5 F  *CHILLS WITH OR WITHOUT FEVER  NAUSEA AND VOMITING THAT IS NOT CONTROLLED WITH YOUR NAUSEA MEDICATION  *UNUSUAL SHORTNESS OF BREATH  *UNUSUAL BRUISING OR BLEEDING  TENDERNESS IN MOUTH AND THROAT WITH OR WITHOUT PRESENCE OF ULCERS  *URINARY PROBLEMS  *BOWEL PROBLEMS  UNUSUAL RASH Items with * indicate a potential emergency and should be followed up as soon as possible.  Feel free to call the clinic you have any questions or concerns. The clinic phone number is [phone].  Please show the CHEMO ALERT CARD at check-in to the Emergency Department and triage nurse.   

## 2015-10-04 ENCOUNTER — Ambulatory Visit (HOSPITAL_BASED_OUTPATIENT_CLINIC_OR_DEPARTMENT_OTHER): Payer: BLUE CROSS/BLUE SHIELD | Admitting: Oncology

## 2015-10-04 ENCOUNTER — Other Ambulatory Visit (HOSPITAL_BASED_OUTPATIENT_CLINIC_OR_DEPARTMENT_OTHER): Payer: BLUE CROSS/BLUE SHIELD

## 2015-10-04 VITALS — BP 109/66 | HR 92 | Temp 98.2°F | Resp 18 | Ht 68.0 in | Wt 177.2 lb

## 2015-10-04 DIAGNOSIS — C50412 Malignant neoplasm of upper-outer quadrant of left female breast: Secondary | ICD-10-CM | POA: Diagnosis not present

## 2015-10-04 DIAGNOSIS — G43009 Migraine without aura, not intractable, without status migrainosus: Secondary | ICD-10-CM

## 2015-10-04 DIAGNOSIS — R232 Flushing: Secondary | ICD-10-CM | POA: Diagnosis not present

## 2015-10-04 LAB — CBC WITH DIFFERENTIAL/PLATELET
BASO%: 0.7 % (ref 0.0–2.0)
BASOS ABS: 0 10*3/uL (ref 0.0–0.1)
EOS%: 1.6 % (ref 0.0–7.0)
Eosinophils Absolute: 0.1 10*3/uL (ref 0.0–0.5)
HCT: 38.8 % (ref 34.8–46.6)
HEMOGLOBIN: 13.2 g/dL (ref 11.6–15.9)
LYMPH%: 25 % (ref 14.0–49.7)
MCH: 36.2 pg — AB (ref 25.1–34.0)
MCHC: 34 g/dL (ref 31.5–36.0)
MCV: 106.6 fL — ABNORMAL HIGH (ref 79.5–101.0)
MONO#: 0.5 10*3/uL (ref 0.1–0.9)
MONO%: 8.1 % (ref 0.0–14.0)
NEUT#: 3.7 10*3/uL (ref 1.5–6.5)
NEUT%: 64.6 % (ref 38.4–76.8)
Platelets: 232 10*3/uL (ref 145–400)
RBC: 3.64 10*6/uL — ABNORMAL LOW (ref 3.70–5.45)
RDW: 13.7 % (ref 11.2–14.5)
WBC: 5.8 10*3/uL (ref 3.9–10.3)
lymph#: 1.4 10*3/uL (ref 0.9–3.3)

## 2015-10-04 MED ORDER — VENLAFAXINE HCL ER 75 MG PO CP24: 75.0000 mg | ORAL_CAPSULE | Freq: Every day | ORAL | Status: DC

## 2015-10-04 NOTE — Progress Notes (Signed)
[facility]  [phone]) [phone] [phone]) [phone]     ID: Cynthia [name] DOB: Feb 22, 1966  MR#: [id number]  HBZ#:[id number]  Patient Care Team: No Pcp Per Patient as PCP - General (General Practice) Rolm Bookbinder, MD as Consulting Physician (General Surgery) Chauncey Cruel, MD as Consulting Physician (Oncology) Kyung Rudd, MD as Consulting Physician (Radiation Oncology) Mauro Kaufmann, RN as Registered Nurse Rockwell Germany, RN as Registered Nurse PCP: No PCP Per Patient OTHER MD: Christene Slates M.D.  CHIEF COMPLAINT: Triple positive breast cancer  CURRENT TREATMENT: trastuzumab; adjuvant radiation   BREAST CANCER HISTORY: From the original intake note:  Cynthia [name] had noted a change in her left breast on self-exam the last week in March. She brought it to Dr. Trellis Paganini attention and on 04/13/2015 underwent diagnostic bilateral mammography and left ultrasonography at [facility]. This found the breast composition to be category D. An area of architectural distortion was noted in the left breast upper outer quadrant correlating to the palpable mass. By ultrasound there was a 2 cm lobulated mass in the left breast upper outer quadrant which was hypoechoic. There was a 4 mm nodule immediately adjacent to this felt to be a satellite lesion.  Biopsy of the left breast mass in question 04/14/2015 showed ([id number]) invasive ductal carcinoma, grade 2, estrogen receptor 90% positive, progesterone receptor 90% positive, with an MIB-1 of 20%, and HER-2 amplified, the signals ratio being 5.36.  The patient's subsequent history is as detailed below  INTERVAL HISTORY: Cynthia [name] returns today for follow up of her breast cancer. After completing her neoadjuvant chemotherapy she proceeded to left lumpectomy and sentinel lymph node sampling 09/21/2015. The final pathology ([id number]) showed a 1.4 cm area of residual invasive ductal carcinoma, grade 2. All 4 sentinel lymph nodes were clear.  Margins were negative.    REVIEW OF SYSTEMS: Cynthia [name] did well with the surgery, without unusual pain, fever, or bleeding problems. She does have a strange sensation in the medial aspect of her left upper arm, which becomes number at the axilla. She has a little bit of tearing, although her tears today are more sympathy for a young woman she met in the hall who was exceedingly anxious about her new breast cancer. Please also has some ringing in her ears and other symptoms of mild seasonal allergies. Her ankles swell. She sleeps on 2 pillows. She feels short of breath when walking up stairs. She still occasionally nauseated. She has a rash between the breasts where the bra hits. She has tried to simply not wear a bra for a while but that has not cleared. She still has rare headaches. She feels anxious and depressed. Hot flashes are "terrible" both daytime and nighttime and "the medicines are not helping). She complains of moderate insomnia. A detailed review of systems was otherwise stable  PAST MEDICAL HISTORY: Past Medical History  Diagnosis Date  . 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 Anxiety   . Hot flashes   . Breast cancer 03/2015    IDC of UOQ of left breast; ER/PR+, Her2+  . Skin cancer     Hx of melanoma at 34y; multiple basal cell carcinomas first dx in late 25s   melanoma, removed in [place]   PAST SURGICAL HISTORY: Past Surgical History  Procedure Laterality Date  . Bladder suspension  2009  . Dnc    . Dilation and curettage of uterus    . Portacath placement Right 04/27/2015  Procedure: INSERTION PORT-A-CATH;  Surgeon: Rolm Bookbinder, MD;  Location: [facility];  Service: General;  Laterality: Right;  . Radioactive seed guided mastectomy with axillary sentinel lymph node biopsy Left 09/21/2015    Procedure: RADIOACTIVE SEED GUIDED LUMPECTOMY WITH AXILLARY SENTINEL LYMPH NODE BIOPSY;  Surgeon: Rolm Bookbinder, MD;  Location:  [facility];  Service: General;  Laterality: Left;    FAMILY HISTORY Family History  Problem Relation Age of Onset  . Breast cancer Maternal Aunt     dx. 31s  . Lung cancer Maternal Grandmother     dx. late 50s-early 60s; "metastasis to breast"; smoker  . Lung cancer Paternal Grandfather     dx. 65s; smokeless tobacco user  . Other Mother     hx of TAH  . Colon polyps Father     unspecified amt  . Melanoma Maternal Uncle 64  . Heart Problems Paternal Grandmother   . Diabetes Paternal Grandmother   . Melanoma Other 52   the patient's parents are still living as of April 2016. They are divorced. The patient is an only child. The patient's paternal grandfather and maternal grandmother had a history of lung cancer. On the mother's side there is a history of melanoma. The patient's mother's only sister was diagnosed with breast cancer in her 58s. There is no history of ovarian cancer in the family.   GYNECOLOGIC HISTORY:  No LMP recorded.  menarche age 35, first live birth age 74, the patient is GX P3. Her periods are ongoing but irregular. She has a Mirena in place for contraception. She took oral contraceptives for approximately 15 years in the past with no complications.   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 She is single, and lives at home with her father, who is disabled and has a history of depression. At home also is the patient's [name], 50 years old as of April 2016. The patient's 2 other daughters are [name], lives in [place] and is a physical therapy technician, and [name], lives in [facility] and works in Press photographer.     ADVANCED DIRECTIVES:  not in place.    HEALTH MAINTENANCE: Social History  Substance Use Topics  . Smoking status: Never Smoker   . Smokeless tobacco: Never Used  . Alcohol Use: Yes     Comment: occasion drink; only smoked cigs socially while in college     Colonoscopy: never    PAP:  Bone density: never   Lipid panel:  Allergies  Allergen Reactions  . Zofran [Ondansetron Hcl] Other (See Comments)    headache  . Adhesive [Tape] Rash    Steri strip    Current Outpatient Prescriptions  Medication Sig Dispense Refill  . ALPRAZolam (XANAX) 1 MG tablet     . famotidine (PEPCID) 20 MG tablet Take 1 tablet (20 mg total) by mouth 2 (two) times daily. 30 tablet 0  . gabapentin (NEURONTIN) 300 MG capsule Take 1 capsule (300 mg total) by mouth at bedtime. 90 capsule 3  . loratadine (CLARITIN) 10 MG tablet Take 1 tablet (10 mg total) by mouth daily. Daily for 3-5 days starting on neulasta injection day 30 tablet 2  . minocycline (MINOCIN) 100 MG capsule Take 1 capsule (100 mg total) by mouth 2 (two) times daily. 100 capsule 3  . Multiple Vitamins-Minerals (CENTRUM SILVER PO) Take by mouth.    . nystatin cream (MYCOSTATIN) Apply 1 application topically 2 (two) times  daily. 30 g 0  . oxyCODONE-acetaminophen (PERCOCET) 10-325 MG tablet Take 1 tablet by mouth every 6 (six) hours as needed for pain. 20 tablet 0  . venlafaxine XR (EFFEXOR-XR) 75 MG 24 hr capsule Take 1 capsule (75 mg total) by mouth daily with breakfast. 90 capsule 1   No current facility-administered medications for this visit.    OBJECTIVE: middle-aged white woman in no acute distress Filed Vitals:   10/04/15 1233  BP: 109/66  Pulse: 92  Temp: 98.2 F (36.8 C)  Resp: 18     Body mass index is 26.95 kg/(m^2).    ECOG FS:1 - Symptomatic but completely ambulatory  Sclerae unicteric, pupils round and equal Oropharynx clear and moist-- no thrush or other lesions No cervical or supraclavicular adenopathy Lungs no rales or rhonchi Heart regular rate and rhythm Abd soft, nontender, positive bowel sounds MSK no focal spinal tenderness, no upper extremity lymphedema Neuro: nonfocal, well oriented, appropriate affect Breasts: the left breast is status post lumpectomy. It is slightly larger than the  right. The overall cosmetic result is excellent. The incisions are healing well, without dehiscence, erythema, or swelling of the left axilla is benign. Skin: There is a maculopapular acneform rash scattered between the breasts. It crosses the midline  LAB RESULTS:  CMP     Component Value Date/Time   NA 141 09/30/2015 1008   K 4.0 09/30/2015 1008   CO2 27 09/30/2015 1008   GLUCOSE 78 09/30/2015 1008   BUN 15.2 09/30/2015 1008   CREATININE 0.7 09/30/2015 1008   CALCIUM 9.2 09/30/2015 1008   PROT 6.1* 09/30/2015 1008   ALBUMIN 3.3* 09/30/2015 1008   AST 15 09/30/2015 1008   ALT 13 09/30/2015 1008   ALKPHOS 101 09/30/2015 1008   BILITOT 0.30 09/30/2015 1008    INo results found for: SPEP, UPEP  Lab Results  Component Value Date   WBC 5.8 10/04/2015   NEUTROABS 3.7 10/04/2015   HGB 13.2 10/04/2015   HCT 38.8 10/04/2015   MCV 106.6* 10/04/2015   PLT 232 10/04/2015      Chemistry      Component Value Date/Time   NA 141 09/30/2015 1008   K 4.0 09/30/2015 1008   CO2 27 09/30/2015 1008   BUN 15.2 09/30/2015 1008   CREATININE 0.7 09/30/2015 1008      Component Value Date/Time   CALCIUM 9.2 09/30/2015 1008   ALKPHOS 101 09/30/2015 1008   AST 15 09/30/2015 1008   ALT 13 09/30/2015 1008   BILITOT 0.30 09/30/2015 1008       No results found for: LABCA2  No components found for: LABCA125  No results for input(s): INR in the last 168 hours.  Urinalysis No results found for: COLORURINE, APPEARANCEUR, LABSPEC, PHURINE, GLUCOSEU, HGBUR, BILIRUBINUR, KETONESUR, PROTEINUR, UROBILINOGEN, NITRITE, LEUKOCYTESUR  STUDIES: Nm Sentinel Node Inj-no Rpt (breast)  09/21/2015   CLINICAL DATA: 48 yof for left breast seed guided lumpectomy, left  axillary sn biopsy   Sulfur colloid was injected intradermally by the nuclear medicine  technologist for breast cancer sentinel node localization.     ASSESSMENT: 49 y.o. [place] woman status post left breast biopsy 04/14/2015 for a  clinical T2 N0, stage IIA invasive ductal carcinoma, grade 2, estrogen and progesterone receptor positive, with HER-2 amplified, and an MIB-1 of 20%  (1). Neoadjuvant chemotherapy consisting of carboplatin, docetaxel, trastuzumab and pertuzumab every 21 days 6, with onpro support, satrted 05/06/2015, completed 08/19/2015  (a) pertuzumab held after 1st cycle because of  rash  (2) trastuzumab to be continued to total one year (to mid-May 2017)  (a) most recent echo 08/25/2015 shows EF 55-60%  (3) status post left lumpectomy and sentinel lung lymph node sampling 09/21/2015 for a ypT1c ypN0 Invasive ductal carcinoma, grade 2, with negative margins.  (4) radiation to follow surgery  (5) anti-estrogens to follow radiation  (6) genetics testing 09/06/2015 through the 8-gene Breast High/Moderate Risk Panel offered [facility] (Hope Pigeon, MD) found no deleterious mutations in ATM, BRCA1, BRCA2, CDH1, CHEK2, PALB2, PTEN, and TP53.   PLAN: Cynthia [name] did well with her definitive surgery and is now ready for radiation to complete her local treatment. She understands that estrogen receptor positive tumors treated neoadjuvantly generally do not achieve a complete pathologic response, but that in general her prognosis is still very good.  She will continue on trastuzumab through May of next year. She will need a repeat echocardiogram in November. This was ordered.  She will then return to see me in December and at that time we will begin anti-estrogens.  As for her hot flashes, we could opt the dose of her gabapentin and venlafaxine or add clonidine. She was reluctant to make these changes and hopes as I do that with a little bit more time the hot flashes will "burnout of themselves".  She knows the goal of treatment in her case is cure. She will call with any problems that may develop before her next visit here.   Chauncey Cruel, MD   10/04/2015 1:05 PM

## 2015-10-06 NOTE — Progress Notes (Signed)
   Department of Radiation Oncology  Phone:  [phone] Fax:        [phone]   Name: Cynthia [name] MRN: [id number]  DOB: May 24, 1966  Date: 10/07/2015  Follow Up Visit Note  Diagnosis: Breast cancer of upper-outer quadrant of left female breast [facility])   Staging form: Breast, AJCC 7th Edition     Clinical stage from 04/21/2015: Stage IIA (T2, N0, M0) - Unsigned  Interval History: Cynthia [name] presents today for routine followup.  She finished chemotherapy and took some time off to visit [place] with her daughter.  She had her surgery on 9/27 which showed residual carcinoma measuring 1.4 cm with 0/4 LN.  Her margins were negative with one focally close anterior margin. She has healed up well and sees Dr. Donne Hazel next week. She is off work in October and will return in November. She has some numbness and stretching type pain under her left arm.   Physical Exam:  Filed Vitals:   10/07/15 0948  BP: 123/96  Pulse: 102  Temp: 98.3 F (36.8 C)  Weight: 180 lb 12.8 oz (82.01 kg)   Pleasant female. Healing lumpectomy incision  IMPRESSION: Cynthia [name] is a 49 y.o. female  S.p breast conservation , now ready for radiation.   PLAN:  I spoke to the patient today regarding her diagnosis and options for treatment. We discussed the equivalence in terms of survival and local failure between mastectomy and breast conservation. We discussed the role of radiation in decreasing local failures in patients who undergo lumpectomy. We discussed the process of simulation and the placement tattoos. We discussed 6 weeks of treatment as an outpatient. We discussed the possibility of asymptomatic lung damage. We discussed the low likelihood of secondary malignancies. We discussed the possible side effects including but not limited to skin redness, fatigue, permanent skin darkening, and breast swelling.  We discussed the use of cardiac sparing with deep inspiration breath hold if needed.  I scheduled her for simulation next  week. She will continue Herceptin.   Thea Silversmith, MD

## 2015-10-06 NOTE — Progress Notes (Signed)
Location of Breast Cancer:Left breast cancer, grade 2.  Histology per Pathology Report: 09/21/15  Diagnosis 1. 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 - LYMPH/VASCULAR INVASION IS PRESENT. - HIGH GRADE DUCTAL CARCINOMA IN SITU IS FOCALLY 0.1 TO 0.2 CM FROM ANTERIOR MARGIN. - OTHER MARGINS ARE NEGATIVE. - SEE ONCOLOGY TEMPLATE. 2. Lymph node, sentinel, biopsy, left axillary - ONE BENIGN LYMPH NODE WITH NO TUMOR SEEN (0/1). 3. Lymph node, sentinel, biopsy, left axillary - ONE BENIGN LYMPH NODE WITH NO TUMOR SEEN (0/1). 4. Lymph node, sentinel, biopsy, left axillary - ONE BENIGN LYMPH NODE WITH NO TUMOR SEEN (0/1). 5. Lymph node, sentinel, biopsy, left axillary - ONE BENIGN LYMPH NODE WITH NO TUMOR SEEN (0/1).  Receptor Status: ER(+), PR (+), Her2-neu ()  Did patient present with symptoms (if so, please note symptoms) or was this found on screening mammography?: found by patient on palpation.States she was itching.  Past/Anticipated interventions by surgeon, if any:09/21/15 RADIOACTIVE SEED GUIDED PARTIAL MASTECTOMY WITH AXILLARY SENTINEL LYMPH NODE BIOPSY by Dr.Matthew Durward Fortes  Past/Anticipated interventions by medical oncology, if any: Chemotherapy carboplatin,docetaxel,trastuzumab and pertuzumab completed on but continues on herceptin for one year.  Lymphedema issues, if any: No  Pain issues, if any:No.tender.  SAFETY ISSUES:  Prior radiation? No  Pacemaker/ICD? NO  Possible current pregnancy? mirena d/c'd April 2016..took oral contraceptives for 15 years.  Is the patient on methotrexate? 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 Last menstrual period April 2016. Family cancer history:Maternal aunt had  breast cancer.maternal grandmother and paternal grandfather had lung cancer. Allergies;zofran, adhesive tape Former smoker    Audiological scientist, Publishing rights manager, RN 10/06/2015,6:59 PM  BP 123/96 mmHg  Pulse 102  Temp(Src) 98.3 F (36.8 C)  Wt 180 lb 12.8 oz (82.01 kg)

## 2015-10-07 ENCOUNTER — Ambulatory Visit
Admission: RE | Admit: 2015-10-07 | Discharge: 2015-10-07 | Disposition: A | Discharge status: Home / Self Care | Payer: BLUE CROSS/BLUE SHIELD | Source: Ambulatory Visit | Attending: Radiation Oncology | Admitting: Radiation Oncology

## 2015-10-07 VITALS — BP 123/96 | HR 102 | Temp 98.3°F | Wt 180.8 lb

## 2015-10-07 DIAGNOSIS — C50412 Malignant neoplasm of upper-outer quadrant of left female breast: Secondary | ICD-10-CM

## 2015-10-07 DIAGNOSIS — Z170 Estrogen receptor positive status [ER+]: Secondary | ICD-10-CM | POA: Insufficient documentation

## 2015-10-07 DIAGNOSIS — Z510 Encounter for antineoplastic radiation therapy: Secondary | ICD-10-CM | POA: Insufficient documentation

## 2015-10-12 ENCOUNTER — Ambulatory Visit: Payer: BLUE CROSS/BLUE SHIELD | Admitting: Radiation Oncology

## 2015-10-12 ENCOUNTER — Encounter (HOSPITAL_COMMUNITY): Payer: Self-pay

## 2015-10-13 ENCOUNTER — Ambulatory Visit
Admission: RE | Admit: 2015-10-13 | Discharge: 2015-10-13 | Disposition: A | Discharge status: Home / Self Care | Payer: BLUE CROSS/BLUE SHIELD | Source: Ambulatory Visit | Attending: Radiation Oncology | Admitting: Radiation Oncology

## 2015-10-13 DIAGNOSIS — C50412 Malignant neoplasm of upper-outer quadrant of left female breast: Secondary | ICD-10-CM

## 2015-10-13 DIAGNOSIS — Z510 Encounter for antineoplastic radiation therapy: Secondary | ICD-10-CM | POA: Diagnosis not present

## 2015-10-13 NOTE — Progress Notes (Signed)
Name: Capria Cartaya   MRN: [id number]  Date:  10/13/2015  DOB: 10/29/1966  Status:outpatient   DIAGNOSIS: Left Breast cancer.  CONSENT VERIFIED: yes SET UP: Patient is setup supine  IMMOBILIZATION:  The following immobilization was used:Custom Moldable Pillow, breast board.  NARRATIVE: Ms. Omahoney was brought to the [place].  Identity was confirmed.  All relevant records and images related to the planned course of therapy were reviewed.  Then, the patient was positioned in a stable reproducible clinical set-up for radiation therapy.  Wires were placed to delineate the clinical extent of breast tissue. A wire was placed on the scar as well.  CT images were obtained.  An isocenter was placed. Skin markings were placed.  The position of the heart was then analyzed.  Due to the proximity of the heart to the chest wall, I felt she would benefit from deep inspiration breath hold for cardiac sparing.  She was then coached and rescanned in the breath hold position.  Acceptable cardiac sparing was achieved. The CT images were loaded into the planning software where the target and avoidance structures were contoured.  The radiation prescription was entered and confirmed. The patient was discharged in stable condition and tolerated simulation well.    TREATMENT PLANNING NOTE/3D Simulation Note Treatment planning then occurred. I have requested : MLC's, isodose plan, basic dose calculation  3D simulation was performed.  I personally designed and supervised the construction of 3 medically necessary complex treatment devices in the form of MLCs which will be used for beam modification and to protect critical structures including the heart and lung as well as the immobilization device which is necessary for reproducible set up.  I have requested a dose volume histogram of the heart, lung and tumor cavity.    ------------------------------------------------  Thea Silversmith, MD  This  document serves as a record of services personally performed by Thea Silversmith, MD. It was created on her behalf by Derek Mound, a trained medical scribe. The creation of this record is based on the scribe's personal observations and the provider's statements to them. This document has been checked and approved by the attending provider.

## 2015-10-14 NOTE — Addendum Note (Signed)
Encounter addended by: Thea Silversmith, MD on: 10/14/2015  8:13 AM<BR>     Documentation filed: Notes Section

## 2015-10-14 NOTE — Progress Notes (Signed)
Radiation Oncology         [phone]) [phone] ________________________________  Name: Cynthia [name]      MRN: [id number]          Date: 10/14/2015              DOB: 12/27/1965  Optical Surface Tracking Plan:  Since intensity modulated radiotherapy (IMRT) and 3D conformal radiation treatment methods are predicated on accurate and precise positioning for treatment, intrafraction motion monitoring is medically necessary to ensure accurate and safe treatment delivery.  The ability to quantify intrafraction motion without excessive ionizing radiation dose can only be performed with optical surface tracking. Accordingly, surface imaging offers the opportunity to obtain 3D measurements of patient position throughout IMRT and 3D treatments without excessive radiation exposure.  I am ordering optical surface tracking for this patient's upcoming course of radiotherapy. ________________________________ Signature   Reference:   Ursula Alert, J, et al. Surface imaging-based analysis of intrafraction motion for breast radiotherapy patients.Journal of [place], n. 6, nov. 2014. ISSN [id number].   Available at: <http://www.jacmp.org/index.php/jacmp/article/view/4957>.

## 2015-10-19 DIAGNOSIS — Z510 Encounter for antineoplastic radiation therapy: Secondary | ICD-10-CM | POA: Diagnosis not present

## 2015-10-20 ENCOUNTER — Ambulatory Visit
Admission: RE | Admit: 2015-10-20 | Discharge: 2015-10-20 | Disposition: A | Discharge status: Home / Self Care | Payer: BLUE CROSS/BLUE SHIELD | Source: Ambulatory Visit | Attending: Radiation Oncology | Admitting: Radiation Oncology

## 2015-10-20 VITALS — BP 143/73 | HR 89 | Temp 98.0°F | Ht 68.0 in | Wt 180.0 lb

## 2015-10-20 DIAGNOSIS — C50412 Malignant neoplasm of upper-outer quadrant of left female breast: Secondary | ICD-10-CM

## 2015-10-20 DIAGNOSIS — Z510 Encounter for antineoplastic radiation therapy: Secondary | ICD-10-CM | POA: Diagnosis not present

## 2015-10-20 NOTE — Progress Notes (Signed)
I was asked to see the patient today after her port films. The patient has a mild rash centrally in the chest extending in the inframammary regions bilaterally. She also has some central erythema in the left breast which is slightly warm to touch. The patient feels that the rash has been persistent for approximately one month. She has been using antifungal cream. In regards to the central erythema, the patient notes that she was told to begin Keflex by medical oncology. There is some confusion regarding this as this originally was prescribed for her to take if needed for an issue with her legs. She has begun to take this twice a day and has been taking this she feels for quite some time. In reviewing her records, this was supposed to be a 10 day course for her to take 4 times per day. She will begin now taking this for times a day as prescribed and we discussed that she would not receive a full dose of this for the entire prescribed course given that she was only taking half the dose until now. I discussed with her that Dr. Pablo Ledger could reassess how the breast area looks when she completes the antibiotics which she has and it could be decided whether to continue a longer course at full dose or to switch her to another antibiotic, versus continued observation if this improves.  In my opinion, there is no significant infection or worrisome rash at this time. The patient has been having some hot flashes and she may be experiencing a rash related to sweating underneath the breast which has increased. I believe that she is suitable to proceed with her first fraction of radiation treatment tomorrow. However, we will contact Dr. Pablo Ledger regarding this issue and she is scheduled to see her early next week for her regular weekly visit.

## 2015-10-20 NOTE — Progress Notes (Signed)
Cynthia [name] has noted redness and warmth in her left breast and note swelling.  Very concerned about this.  Also has a itchy rash between her breast near the inframmary fold on the right.  She has used Nystatin cream, then Keflex per [place].

## 2015-10-21 ENCOUNTER — Telehealth: Payer: Self-pay | Admitting: *Deleted

## 2015-10-21 ENCOUNTER — Ambulatory Visit (HOSPITAL_BASED_OUTPATIENT_CLINIC_OR_DEPARTMENT_OTHER): Payer: BLUE CROSS/BLUE SHIELD

## 2015-10-21 ENCOUNTER — Other Ambulatory Visit (HOSPITAL_BASED_OUTPATIENT_CLINIC_OR_DEPARTMENT_OTHER): Payer: BLUE CROSS/BLUE SHIELD

## 2015-10-21 ENCOUNTER — Ambulatory Visit
Admission: RE | Admit: 2015-10-21 | Discharge: 2015-10-21 | Disposition: A | Discharge status: Home / Self Care | Payer: BLUE CROSS/BLUE SHIELD | Source: Ambulatory Visit | Attending: Radiation Oncology | Admitting: Radiation Oncology

## 2015-10-21 ENCOUNTER — Encounter: Payer: Self-pay | Admitting: *Deleted

## 2015-10-21 VITALS — BP 121/68 | HR 85 | Temp 98.0°F | Resp 16

## 2015-10-21 DIAGNOSIS — Z5112 Encounter for antineoplastic immunotherapy: Secondary | ICD-10-CM | POA: Diagnosis not present

## 2015-10-21 DIAGNOSIS — C50412 Malignant neoplasm of upper-outer quadrant of left female breast: Secondary | ICD-10-CM

## 2015-10-21 DIAGNOSIS — G43009 Migraine without aura, not intractable, without status migrainosus: Secondary | ICD-10-CM

## 2015-10-21 DIAGNOSIS — Z510 Encounter for antineoplastic radiation therapy: Secondary | ICD-10-CM | POA: Diagnosis not present

## 2015-10-21 LAB — CBC WITH DIFFERENTIAL/PLATELET
BASO%: 1 % (ref 0.0–2.0)
Basophils Absolute: 0 10*3/uL (ref 0.0–0.1)
EOS%: 3.1 % (ref 0.0–7.0)
Eosinophils Absolute: 0.1 10*3/uL (ref 0.0–0.5)
HCT: 38.1 % (ref 34.8–46.6)
HGB: 12.7 g/dL (ref 11.6–15.9)
LYMPH#: 1.4 10*3/uL (ref 0.9–3.3)
LYMPH%: 35.2 % (ref 14.0–49.7)
MCH: 35.3 pg — ABNORMAL HIGH (ref 25.1–34.0)
MCHC: 33.3 g/dL (ref 31.5–36.0)
MCV: 106.1 fL — ABNORMAL HIGH (ref 79.5–101.0)
MONO#: 0.5 10*3/uL (ref 0.1–0.9)
MONO%: 13 % (ref 0.0–14.0)
NEUT#: 1.9 10*3/uL (ref 1.5–6.5)
NEUT%: 47.7 % (ref 38.4–76.8)
Platelets: 192 10*3/uL (ref 145–400)
RBC: 3.59 10*6/uL — AB (ref 3.70–5.45)
RDW: 12.6 % (ref 11.2–14.5)
WBC: 4 10*3/uL (ref 3.9–10.3)

## 2015-10-21 MED ORDER — SODIUM CHLORIDE 0.9 % IJ SOLN
10.0000 mL | INTRAMUSCULAR | Status: DC | PRN
  Administered 2015-10-21: 10 mL
  Filled 2015-10-21: qty 10

## 2015-10-21 MED ORDER — HEPARIN SOD (PORK) LOCK FLUSH 100 UNIT/ML IV SOLN
500.0000 [IU] | Freq: Once | INTRAVENOUS | Status: AC | PRN
  Administered 2015-10-21: 500 [IU]
  Filled 2015-10-21: qty 5

## 2015-10-21 MED ORDER — ACETAMINOPHEN 325 MG PO TABS
650.0000 mg | ORAL_TABLET | Freq: Once | ORAL | Status: AC
  Administered 2015-10-21: 650 mg via ORAL

## 2015-10-21 MED ORDER — DIPHENHYDRAMINE HCL 25 MG PO CAPS
ORAL_CAPSULE | ORAL | Status: AC
Start: 2015-10-21 — End: 2015-10-21
  Filled 2015-10-21: qty 1

## 2015-10-21 MED ORDER — TRASTUZUMAB CHEMO INJECTION 440 MG
6.0000 mg/kg | Freq: Once | INTRAVENOUS | Status: AC
  Administered 2015-10-21: 483 mg via INTRAVENOUS
  Filled 2015-10-21: qty 23

## 2015-10-21 MED ORDER — ALRA NON-METALLIC DEODORANT (RAD-ONC)
1.0000 "application " | Freq: Once | TOPICAL | Status: AC
  Administered 2015-10-21: 1 via TOPICAL

## 2015-10-21 MED ORDER — RADIAPLEXRX EX GEL
Freq: Once | CUTANEOUS | Status: AC
  Administered 2015-10-21: 13:00:00 via TOPICAL

## 2015-10-21 MED ORDER — SODIUM CHLORIDE 0.9 % IV SOLN
Freq: Once | INTRAVENOUS | Status: AC
  Administered 2015-10-21: 11:00:00 via INTRAVENOUS

## 2015-10-21 MED ORDER — ACETAMINOPHEN 325 MG PO TABS
ORAL_TABLET | ORAL | Status: AC
  Filled 2015-10-21: qty 2

## 2015-10-21 MED ORDER — DIPHENHYDRAMINE HCL 25 MG PO CAPS
25.0000 mg | ORAL_CAPSULE | Freq: Once | ORAL | Status: AC
  Administered 2015-10-21: 25 mg via ORAL

## 2015-10-21 NOTE — Progress Notes (Addendum)
Patient edcucation done, radiation therapy and you book given along with alra deodorant,radiaplex gel, my busines card, discussed ways to manage side effects, pain, fatitue, skin irritation, do not use radaiplex where mild rash in centrally on breast . Looks like sweat bumps, patient stated this has been like this 2 months, on antibiotics, increase protein in diet, stay hydrated, drink plenty water, patient using gold bond powder as well , not to use any products 4 hours before radiation treatments, patient has a mixed schedule of time  Verbal understanding, teach back given, will start radaiplex on breast today after rad txs, Per Patric Dykes, RN saw patient  Wilburn Mylar and today as well and stated rash has improved, patient agreed with her, will proceed with radiation today 5:31 PM

## 2015-10-21 NOTE — Patient Instructions (Signed)
[facility] Discharge Instructions for Patients Receiving Chemotherapy  Today you received the following chemotherapy agents: Herceptin   To help prevent nausea and vomiting after your treatment, we encourage you to take your nausea medication as directed.    If you develop nausea and vomiting that is not controlled by your nausea medication, call the clinic.   BELOW ARE SYMPTOMS THAT SHOULD BE REPORTED IMMEDIATELY:  *FEVER GREATER THAN 100.5 F  *CHILLS WITH OR WITHOUT FEVER  NAUSEA AND VOMITING THAT IS NOT CONTROLLED WITH YOUR NAUSEA MEDICATION  *UNUSUAL SHORTNESS OF BREATH  *UNUSUAL BRUISING OR BLEEDING  TENDERNESS IN MOUTH AND THROAT WITH OR WITHOUT PRESENCE OF ULCERS  *URINARY PROBLEMS  *BOWEL PROBLEMS  UNUSUAL RASH Items with * indicate a potential emergency and should be followed up as soon as possible.  Feel free to call the clinic you have any questions or concerns. The clinic phone number is [phone].  Please show the CHEMO ALERT CARD at check-in to the Emergency Department and triage nurse.   

## 2015-10-21 NOTE — Telephone Encounter (Signed)
Went upstairs to see if patient  Was there still for Herceptin infusion,but had already left, called and asked if she wanted to come back and she could be fited in for her rad txs,  So she wouldn't have to come in at 545pm, <she stated she was already at work but could be back at 59 , will do patient education first then rad tx, patient agreed,

## 2015-10-21 NOTE — Addendum Note (Signed)
Encounter addended by: Doreen Beam, RN on: 10/21/2015  5:57 PM<BR>     Documentation filed: Notes Section

## 2015-10-22 ENCOUNTER — Ambulatory Visit
Admission: RE | Admit: 2015-10-22 | Discharge: 2015-10-22 | Disposition: A | Discharge status: Home / Self Care | Payer: BLUE CROSS/BLUE SHIELD | Source: Ambulatory Visit | Attending: Radiation Oncology | Admitting: Radiation Oncology

## 2015-10-22 DIAGNOSIS — Z510 Encounter for antineoplastic radiation therapy: Secondary | ICD-10-CM | POA: Diagnosis not present

## 2015-10-24 ENCOUNTER — Ambulatory Visit: Admission: RE | Admit: 2015-10-24 | Payer: BLUE CROSS/BLUE SHIELD | Source: Ambulatory Visit

## 2015-10-25 ENCOUNTER — Ambulatory Visit: Payer: BLUE CROSS/BLUE SHIELD

## 2015-10-25 ENCOUNTER — Ambulatory Visit
Admission: RE | Admit: 2015-10-25 | Discharge: 2015-10-25 | Disposition: A | Discharge status: Home / Self Care | Payer: BLUE CROSS/BLUE SHIELD | Source: Ambulatory Visit | Attending: Radiation Oncology | Admitting: Radiation Oncology

## 2015-10-25 DIAGNOSIS — Z510 Encounter for antineoplastic radiation therapy: Secondary | ICD-10-CM | POA: Diagnosis not present

## 2015-10-26 ENCOUNTER — Encounter: Payer: Self-pay | Admitting: Radiation Oncology

## 2015-10-26 ENCOUNTER — Ambulatory Visit
Admission: RE | Admit: 2015-10-26 | Discharge: 2015-10-26 | Disposition: A | Discharge status: Home / Self Care | Payer: BLUE CROSS/BLUE SHIELD | Source: Ambulatory Visit | Attending: Radiation Oncology | Admitting: Radiation Oncology

## 2015-10-26 VITALS — BP 102/84 | HR 84 | Temp 98.0°F | Ht 68.0 in | Wt 182.5 lb

## 2015-10-26 DIAGNOSIS — Z510 Encounter for antineoplastic radiation therapy: Secondary | ICD-10-CM | POA: Diagnosis not present

## 2015-10-26 DIAGNOSIS — C50412 Malignant neoplasm of upper-outer quadrant of left female breast: Secondary | ICD-10-CM

## 2015-10-26 NOTE — Progress Notes (Addendum)
Weekly Management Note Current Dose: 7.2  Gy  Projected Dose: 61 Gy   Narrative:  The patient presents for routine under treatment assessment.  CBCT/MVCT images/Port film x-rays were reviewed.  The chart was checked. Pink around left nipple. Rash in inframammary fold and in between breasts continues. Stopped nystatin powder due to radiation. Rash is worse when she is working out. Bad dreams.   Physical Findings: Weight: 182 lb 8 oz (82.781 kg). Mild red raised rash in between breasts and left > right inframammary fold. Pink around nipple.   Impression:  The patient is tolerating radiation.  Plan:  Continue treatment as planned. Restart nystatin. Place before bed. Monitor breast redness.

## 2015-10-26 NOTE — Progress Notes (Signed)
Cynthia [name] is here for her 4th fraction of radiation to her Left Breast. She reports some fatigue, but is back to working full-time. She does report some difficultly sleeping at night and just feeling wiped out sometimes during the day. Her Left Breast is normal in color without tenderness. She does report she occasionally has a shooting pain in her Left Breast through her nipple. There are two raised red areas between her breasts which she reports itch at times.   BP 102/84 mmHg  Pulse 84  Temp(Src) 98 F (36.7 C)  Ht 5\' 8"  (1.727 m)  Wt 182 lb 8 oz (82.781 kg)  BMI 27.76 kg/m2

## 2015-10-26 NOTE — Addendum Note (Signed)
Encounter addended by: Thea Silversmith, MD on: 10/26/2015  2:34 PM<BR>     Documentation filed: Notes Section

## 2015-10-27 ENCOUNTER — Ambulatory Visit
Admission: RE | Admit: 2015-10-27 | Discharge: 2015-10-27 | Disposition: A | Discharge status: Home / Self Care | Payer: BLUE CROSS/BLUE SHIELD | Source: Ambulatory Visit | Attending: Radiation Oncology | Admitting: Radiation Oncology

## 2015-10-27 DIAGNOSIS — Z510 Encounter for antineoplastic radiation therapy: Secondary | ICD-10-CM | POA: Diagnosis not present

## 2015-10-28 ENCOUNTER — Ambulatory Visit
Admission: RE | Admit: 2015-10-28 | Discharge: 2015-10-28 | Disposition: A | Discharge status: Home / Self Care | Payer: BLUE CROSS/BLUE SHIELD | Source: Ambulatory Visit | Attending: Radiation Oncology | Admitting: Radiation Oncology

## 2015-10-28 ENCOUNTER — Encounter: Payer: Self-pay | Admitting: *Deleted

## 2015-10-28 DIAGNOSIS — Z510 Encounter for antineoplastic radiation therapy: Secondary | ICD-10-CM | POA: Diagnosis not present

## 2015-10-29 ENCOUNTER — Ambulatory Visit
Admission: RE | Admit: 2015-10-29 | Discharge: 2015-10-29 | Disposition: A | Discharge status: Home / Self Care | Payer: BLUE CROSS/BLUE SHIELD | Source: Ambulatory Visit | Attending: Radiation Oncology | Admitting: Radiation Oncology

## 2015-10-29 DIAGNOSIS — Z510 Encounter for antineoplastic radiation therapy: Secondary | ICD-10-CM | POA: Diagnosis not present

## 2015-11-01 ENCOUNTER — Ambulatory Visit
Admission: RE | Admit: 2015-11-01 | Discharge: 2015-11-01 | Disposition: A | Discharge status: Home / Self Care | Payer: BLUE CROSS/BLUE SHIELD | Source: Ambulatory Visit | Attending: Radiation Oncology | Admitting: Radiation Oncology

## 2015-11-01 DIAGNOSIS — Z510 Encounter for antineoplastic radiation therapy: Secondary | ICD-10-CM | POA: Diagnosis not present

## 2015-11-02 ENCOUNTER — Ambulatory Visit
Admission: RE | Admit: 2015-11-02 | Discharge: 2015-11-02 | Disposition: A | Discharge status: Home / Self Care | Payer: BLUE CROSS/BLUE SHIELD | Source: Ambulatory Visit | Attending: Radiation Oncology | Admitting: Radiation Oncology

## 2015-11-02 ENCOUNTER — Encounter: Payer: Self-pay | Admitting: Radiation Oncology

## 2015-11-02 VITALS — BP 109/68 | HR 88 | Temp 97.9°F | Resp 20 | Wt 180.3 lb

## 2015-11-02 DIAGNOSIS — Z510 Encounter for antineoplastic radiation therapy: Secondary | ICD-10-CM | POA: Diagnosis not present

## 2015-11-02 DIAGNOSIS — C50412 Malignant neoplasm of upper-outer quadrant of left female breast: Secondary | ICD-10-CM

## 2015-11-02 NOTE — Progress Notes (Signed)
Weekly rad txs left breast. 9/33 completed,  Erythema on breast,  Rash in between breasts, using radaiplex bid, appetite okay, has tinnitis both ears now 1 month main c/o 11:44 AM BP 109/68 mmHg  Pulse 88  Temp(Src) 97.9 F (36.6 C) (Oral)  Resp 20  Wt 180 lb 4.8 oz (81.784 kg)  Wt Readings from Last 3 Encounters:  11/02/15 180 lb 4.8 oz (81.784 kg)  10/26/15 182 lb 8 oz (82.781 kg)  10/20/15 180 lb (81.647 kg)

## 2015-11-02 NOTE — Progress Notes (Signed)
Weekly Management Note Current Dose: 16.2  Gy  Projected Dose: 61 Gy   Narrative:  The patient presents for routine under treatment assessment.  CBCT/MVCT images/Port film x-rays were reviewed.  The chart was checked. Left breast stable. Rash between breasts continues. Sinus headache and tinnitus. Is going to make appt with ENT  Physical Findings: Weight: 180 lb 4.8 oz (81.784 kg). Mild red raised rash in between breasts and left > right inframammary fold. Pink around nipple. All stable to improved.   Impression:  The patient is tolerating radiation.  Plan:  Continue treatment as planned. Continue nystatin. Make appt with ENT or call Medical Oncology re: tinnitus.

## 2015-11-03 ENCOUNTER — Ambulatory Visit: Payer: BLUE CROSS/BLUE SHIELD

## 2015-11-03 DIAGNOSIS — Z510 Encounter for antineoplastic radiation therapy: Secondary | ICD-10-CM | POA: Diagnosis not present

## 2015-11-04 ENCOUNTER — Ambulatory Visit: Payer: BLUE CROSS/BLUE SHIELD

## 2015-11-04 ENCOUNTER — Other Ambulatory Visit: Payer: Self-pay | Admitting: *Deleted

## 2015-11-04 DIAGNOSIS — Z510 Encounter for antineoplastic radiation therapy: Secondary | ICD-10-CM | POA: Diagnosis not present

## 2015-11-04 DIAGNOSIS — C50412 Malignant neoplasm of upper-outer quadrant of left female breast: Secondary | ICD-10-CM

## 2015-11-04 NOTE — Progress Notes (Signed)
Spoke with patient and she states she still has ringing in both ears.  Per Dr. Jana Hakim to refer her to Dr. Erik Obey (ENT).  Referral placed and POF generated.  Gave phone # to patient as well so she can get an appointment that works with her schedule.

## 2015-11-05 ENCOUNTER — Ambulatory Visit: Payer: BLUE CROSS/BLUE SHIELD

## 2015-11-05 DIAGNOSIS — Z510 Encounter for antineoplastic radiation therapy: Secondary | ICD-10-CM | POA: Diagnosis not present

## 2015-11-08 ENCOUNTER — Ambulatory Visit: Payer: BLUE CROSS/BLUE SHIELD

## 2015-11-08 DIAGNOSIS — Z510 Encounter for antineoplastic radiation therapy: Secondary | ICD-10-CM | POA: Diagnosis not present

## 2015-11-09 ENCOUNTER — Ambulatory Visit
Admission: RE | Admit: 2015-11-09 | Discharge: 2015-11-09 | Disposition: A | Discharge status: Home / Self Care | Payer: BLUE CROSS/BLUE SHIELD | Source: Ambulatory Visit | Attending: Radiation Oncology | Admitting: Radiation Oncology

## 2015-11-09 ENCOUNTER — Encounter: Payer: Self-pay | Admitting: Radiation Oncology

## 2015-11-09 VITALS — BP 117/72 | HR 80 | Temp 98.2°F | Wt 182.9 lb

## 2015-11-09 DIAGNOSIS — Z510 Encounter for antineoplastic radiation therapy: Secondary | ICD-10-CM | POA: Diagnosis not present

## 2015-11-09 DIAGNOSIS — C50412 Malignant neoplasm of upper-outer quadrant of left female breast: Secondary | ICD-10-CM

## 2015-11-09 NOTE — Progress Notes (Signed)
Mrs. Serlin has received 14 fractions. Skin with slight sun burn appearance, denies any pain or discomfort today.  Having some fatique on and off.

## 2015-11-09 NOTE — Progress Notes (Signed)
Weekly Management Note Current Dose: 25.2  Gy  Projected Dose: 61 Gy   Narrative:  The patient presents for routine under treatment assessment.  CBCT/MVCT images/Port film x-rays were reviewed.  The chart was checked. Left breast stable. Rash between breasts continues. More fatigue.   Physical Findings: Weight: 182 lb 14.4 oz (82.963 kg). Mild red raised rash in between breasts and left > right inframammary fold. Pink around nipple. All stable. Dermatitis medially and superiorly.   Impression:  The patient is tolerating radiation.  Plan:  Continue treatment as planned. Continue nystatin. Continue radiaplex.

## 2015-11-10 ENCOUNTER — Other Ambulatory Visit: Payer: Self-pay | Admitting: Nurse Practitioner

## 2015-11-10 ENCOUNTER — Other Ambulatory Visit: Payer: Self-pay

## 2015-11-10 ENCOUNTER — Ambulatory Visit
Admission: RE | Admit: 2015-11-10 | Discharge: 2015-11-10 | Disposition: A | Discharge status: Home / Self Care | Payer: BLUE CROSS/BLUE SHIELD | Source: Ambulatory Visit | Attending: Radiation Oncology | Admitting: Radiation Oncology

## 2015-11-10 DIAGNOSIS — C50412 Malignant neoplasm of upper-outer quadrant of left female breast: Secondary | ICD-10-CM

## 2015-11-10 DIAGNOSIS — Z510 Encounter for antineoplastic radiation therapy: Secondary | ICD-10-CM | POA: Diagnosis not present

## 2015-11-10 NOTE — Progress Notes (Signed)
Ok to treat patient on 11/11/15 without a recent echo per Dr. Jana Hakim.

## 2015-11-11 ENCOUNTER — Other Ambulatory Visit (HOSPITAL_BASED_OUTPATIENT_CLINIC_OR_DEPARTMENT_OTHER): Payer: BLUE CROSS/BLUE SHIELD

## 2015-11-11 ENCOUNTER — Ambulatory Visit
Admission: RE | Admit: 2015-11-11 | Discharge: 2015-11-11 | Disposition: A | Discharge status: Home / Self Care | Payer: BLUE CROSS/BLUE SHIELD | Source: Ambulatory Visit | Attending: Radiation Oncology | Admitting: Radiation Oncology

## 2015-11-11 ENCOUNTER — Ambulatory Visit (HOSPITAL_BASED_OUTPATIENT_CLINIC_OR_DEPARTMENT_OTHER): Payer: BLUE CROSS/BLUE SHIELD

## 2015-11-11 VITALS — BP 124/80 | HR 83 | Temp 97.8°F

## 2015-11-11 DIAGNOSIS — G43009 Migraine without aura, not intractable, without status migrainosus: Secondary | ICD-10-CM

## 2015-11-11 DIAGNOSIS — Z5112 Encounter for antineoplastic immunotherapy: Secondary | ICD-10-CM | POA: Diagnosis not present

## 2015-11-11 DIAGNOSIS — Z510 Encounter for antineoplastic radiation therapy: Secondary | ICD-10-CM | POA: Diagnosis not present

## 2015-11-11 DIAGNOSIS — C50412 Malignant neoplasm of upper-outer quadrant of left female breast: Secondary | ICD-10-CM

## 2015-11-11 LAB — CBC WITH DIFFERENTIAL/PLATELET
BASO%: 0.5 % (ref 0.0–2.0)
Basophils Absolute: 0 10*3/uL (ref 0.0–0.1)
EOS%: 3.5 % (ref 0.0–7.0)
Eosinophils Absolute: 0.1 10*3/uL (ref 0.0–0.5)
HEMATOCRIT: 38.6 % (ref 34.8–46.6)
HEMOGLOBIN: 12.8 g/dL (ref 11.6–15.9)
LYMPH#: 1.2 10*3/uL (ref 0.9–3.3)
LYMPH%: 30 % (ref 14.0–49.7)
MCH: 34.6 pg — ABNORMAL HIGH (ref 25.1–34.0)
MCHC: 33.2 g/dL (ref 31.5–36.0)
MCV: 104.3 fL — ABNORMAL HIGH (ref 79.5–101.0)
MONO#: 0.4 10*3/uL (ref 0.1–0.9)
MONO%: 8.9 % (ref 0.0–14.0)
NEUT%: 57.1 % (ref 38.4–76.8)
NEUTROS ABS: 2.3 10*3/uL (ref 1.5–6.5)
PLATELETS: 178 10*3/uL (ref 145–400)
RBC: 3.7 10*6/uL (ref 3.70–5.45)
RDW: 12 % (ref 11.2–14.5)
WBC: 4 10*3/uL (ref 3.9–10.3)

## 2015-11-11 MED ORDER — SODIUM CHLORIDE 0.9 % IV SOLN
6.0000 mg/kg | Freq: Once | INTRAVENOUS | Status: AC
  Administered 2015-11-11: 483 mg via INTRAVENOUS
  Filled 2015-11-11: qty 23

## 2015-11-11 MED ORDER — ACETAMINOPHEN 325 MG PO TABS
650.0000 mg | ORAL_TABLET | Freq: Once | ORAL | Status: AC
  Administered 2015-11-11: 650 mg via ORAL

## 2015-11-11 MED ORDER — DIPHENHYDRAMINE HCL 25 MG PO CAPS
ORAL_CAPSULE | ORAL | Status: AC
  Filled 2015-11-11: qty 1

## 2015-11-11 MED ORDER — HEPARIN SOD (PORK) LOCK FLUSH 100 UNIT/ML IV SOLN
500.0000 [IU] | Freq: Once | INTRAVENOUS | Status: AC | PRN
  Administered 2015-11-11: 500 [IU]
  Filled 2015-11-11: qty 5

## 2015-11-11 MED ORDER — SODIUM CHLORIDE 0.9 % IJ SOLN
10.0000 mL | INTRAMUSCULAR | Status: DC | PRN
Start: 2015-11-11 — End: 2015-11-11
  Administered 2015-11-11: 10 mL
  Filled 2015-11-11: qty 10

## 2015-11-11 MED ORDER — SODIUM CHLORIDE 0.9 % IV SOLN
Freq: Once | INTRAVENOUS | Status: AC
  Administered 2015-11-11: 11:00:00 via INTRAVENOUS

## 2015-11-11 MED ORDER — DIPHENHYDRAMINE HCL 25 MG PO CAPS
25.0000 mg | ORAL_CAPSULE | Freq: Once | ORAL | Status: AC
  Administered 2015-11-11: 25 mg via ORAL

## 2015-11-11 MED ORDER — ACETAMINOPHEN 325 MG PO TABS
ORAL_TABLET | ORAL | Status: AC
  Filled 2015-11-11: qty 2

## 2015-11-11 NOTE — Patient Instructions (Signed)
[facility] Discharge Instructions for Patients Receiving Chemotherapy  Today you received the following chemotherapy agents :  herceptin  To help prevent nausea and vomiting after your treatment, we encourage you to take your nausea medication as prescribed.   If you develop nausea and vomiting that is not controlled by your nausea medication, call the clinic.   BELOW ARE SYMPTOMS THAT SHOULD BE REPORTED IMMEDIATELY:  *FEVER GREATER THAN 100.5 F  *CHILLS WITH OR WITHOUT FEVER  NAUSEA AND VOMITING THAT IS NOT CONTROLLED WITH YOUR NAUSEA MEDICATION  *UNUSUAL SHORTNESS OF BREATH  *UNUSUAL BRUISING OR BLEEDING  TENDERNESS IN MOUTH AND THROAT WITH OR WITHOUT PRESENCE OF ULCERS  *URINARY PROBLEMS  *BOWEL PROBLEMS  UNUSUAL RASH Items with * indicate a potential emergency and should be followed up as soon as possible.  Feel free to call the clinic you have any questions or concerns. The clinic phone number is [phone].  Please show the CHEMO ALERT CARD at check-in to the Emergency Department and triage nurse.   

## 2015-11-12 ENCOUNTER — Ambulatory Visit: Payer: BLUE CROSS/BLUE SHIELD

## 2015-11-12 DIAGNOSIS — Z510 Encounter for antineoplastic radiation therapy: Secondary | ICD-10-CM | POA: Diagnosis not present

## 2015-11-14 ENCOUNTER — Encounter: Payer: Self-pay | Admitting: Radiation Oncology

## 2015-11-14 ENCOUNTER — Ambulatory Visit
Admission: RE | Admit: 2015-11-14 | Discharge: 2015-11-14 | Disposition: A | Discharge status: Home / Self Care | Payer: BLUE CROSS/BLUE SHIELD | Source: Ambulatory Visit | Attending: Radiation Oncology | Admitting: Radiation Oncology

## 2015-11-14 DIAGNOSIS — Z510 Encounter for antineoplastic radiation therapy: Secondary | ICD-10-CM | POA: Diagnosis not present

## 2015-11-15 ENCOUNTER — Ambulatory Visit
Admission: RE | Admit: 2015-11-15 | Discharge: 2015-11-15 | Disposition: A | Discharge status: Home / Self Care | Payer: BLUE CROSS/BLUE SHIELD | Source: Ambulatory Visit | Attending: Radiation Oncology | Admitting: Radiation Oncology

## 2015-11-15 DIAGNOSIS — Z510 Encounter for antineoplastic radiation therapy: Secondary | ICD-10-CM | POA: Diagnosis not present

## 2015-11-16 ENCOUNTER — Ambulatory Visit: Payer: BLUE CROSS/BLUE SHIELD | Admitting: Physical Therapy

## 2015-11-16 ENCOUNTER — Telehealth: Payer: Self-pay | Admitting: *Deleted

## 2015-11-16 ENCOUNTER — Ambulatory Visit
Admission: RE | Admit: 2015-11-16 | Discharge: 2015-11-16 | Disposition: A | Discharge status: Home / Self Care | Payer: BLUE CROSS/BLUE SHIELD | Source: Ambulatory Visit | Attending: Radiation Oncology | Admitting: Radiation Oncology

## 2015-11-16 ENCOUNTER — Encounter: Payer: Self-pay | Admitting: Radiation Oncology

## 2015-11-16 VITALS — BP 117/81 | HR 76 | Temp 98.0°F | Ht 68.0 in | Wt 180.0 lb

## 2015-11-16 DIAGNOSIS — C50412 Malignant neoplasm of upper-outer quadrant of left female breast: Secondary | ICD-10-CM

## 2015-11-16 DIAGNOSIS — Z510 Encounter for antineoplastic radiation therapy: Secondary | ICD-10-CM | POA: Diagnosis not present

## 2015-11-16 NOTE — Telephone Encounter (Signed)
CALLED PATIENT TO ASK ABOUT SCHEDULING PT APPT., PATIENT STATED THAT SHE NEEDS TO LOOK AT HER SCHEDULE AND CALL ME BACK , SHE STATES THAT SHE DOESN'T KNOW WHEN SHE MIGHT BE ABLE TO DO THIS, NOTIFIED Victoria AND SPOKE WITH ROSE

## 2015-11-16 NOTE — Progress Notes (Addendum)
Ms. Newsum presents for her 20th fraction of radiation to her Left Breast. She reports her left arm is cold, and she almost dropped something from her hand this weekend because of a weak grip. She has also noticed "tightness" in her Left arm. She also reports she had gotten sensation back under her upper arm after surgery, but now numbness has returned under her upper arm almost to her elbow. She also feels tingling in her fingers on her Left hand. Her Left anterior breast is red, and she reports itching to this area. She is using the radiaplex cream as directed. She reports some fatigue, and is tired at the end of the day, she is still working full time.  She also reports ringing in her Left ear which is chronic, and occasional sharp pains behind her ear, and she has an appointment with an ENT coming up.   BP 117/81 mmHg  Pulse 76  Temp(Src) 98 F (36.7 C)  Ht 5\' 8"  (1.727 m)  Wt 180 lb (81.647 kg)  BMI 27.38 kg/m2

## 2015-11-17 ENCOUNTER — Telehealth: Payer: Self-pay | Admitting: *Deleted

## 2015-11-17 ENCOUNTER — Ambulatory Visit
Admission: RE | Admit: 2015-11-17 | Discharge: 2015-11-17 | Disposition: A | Discharge status: Home / Self Care | Payer: BLUE CROSS/BLUE SHIELD | Source: Ambulatory Visit | Attending: Radiation Oncology | Admitting: Radiation Oncology

## 2015-11-17 DIAGNOSIS — Z510 Encounter for antineoplastic radiation therapy: Secondary | ICD-10-CM | POA: Diagnosis not present

## 2015-11-17 NOTE — Telephone Encounter (Signed)
Called patient to inform of PT appt. On 11-24-15 @ 8:45 am @ [facility], spoke with patient and she is aware of this appt.

## 2015-11-19 ENCOUNTER — Other Ambulatory Visit: Payer: Self-pay | Admitting: Nurse Practitioner

## 2015-11-22 ENCOUNTER — Ambulatory Visit
Admission: RE | Admit: 2015-11-22 | Discharge: 2015-11-22 | Disposition: A | Discharge status: Home / Self Care | Payer: BLUE CROSS/BLUE SHIELD | Source: Ambulatory Visit | Attending: Radiation Oncology | Admitting: Radiation Oncology

## 2015-11-22 DIAGNOSIS — Z510 Encounter for antineoplastic radiation therapy: Secondary | ICD-10-CM | POA: Diagnosis not present

## 2015-11-23 ENCOUNTER — Other Ambulatory Visit: Payer: Self-pay | Admitting: *Deleted

## 2015-11-23 ENCOUNTER — Ambulatory Visit
Admission: RE | Admit: 2015-11-23 | Discharge: 2015-11-23 | Disposition: A | Discharge status: Home / Self Care | Payer: BLUE CROSS/BLUE SHIELD | Source: Ambulatory Visit | Attending: Radiation Oncology | Admitting: Radiation Oncology

## 2015-11-23 DIAGNOSIS — C50412 Malignant neoplasm of upper-outer quadrant of left female breast: Secondary | ICD-10-CM

## 2015-11-23 DIAGNOSIS — Z510 Encounter for antineoplastic radiation therapy: Secondary | ICD-10-CM | POA: Diagnosis not present

## 2015-11-23 MED ORDER — LORATADINE 10 MG PO TABS: 10.0000 mg | ORAL_TABLET | Freq: Every day | ORAL | Status: DC

## 2015-11-23 NOTE — Progress Notes (Signed)
Weekly Management Note Current Dose: 41.4  Gy  Projected Dose: 61 Gy   Narrative:  The patient presents for routine under treatment assessment.  CBCT/MVCT images/Port film x-rays were reviewed.  The chart was checked. Left breast stable. Rash between breasts continues. More fatigue. Arm still numb with some weakness in hand.  Daughter is PTA and sent exercises so she cancelled PT appt.   Physical Findings: Weight:  . Mild red raised rash in between breasts and left > right inframammary fold. Pink around nipple. All stable. Dermatitis medially and superiorly.   Impression:  The patient is tolerating radiation.  Plan:  Continue treatment as planned. Continue nystatin. Continue radiaplex. Will call PT if she thinks arm/hand is getting worse.

## 2015-11-24 ENCOUNTER — Ambulatory Visit: Payer: BLUE CROSS/BLUE SHIELD | Admitting: Physical Therapy

## 2015-11-24 ENCOUNTER — Ambulatory Visit
Admission: RE | Admit: 2015-11-24 | Discharge: 2015-11-24 | Disposition: A | Discharge status: Home / Self Care | Payer: BLUE CROSS/BLUE SHIELD | Source: Ambulatory Visit | Attending: Radiation Oncology | Admitting: Radiation Oncology

## 2015-11-24 DIAGNOSIS — Z510 Encounter for antineoplastic radiation therapy: Secondary | ICD-10-CM | POA: Diagnosis not present

## 2015-11-25 ENCOUNTER — Ambulatory Visit
Admission: RE | Admit: 2015-11-25 | Discharge: 2015-11-25 | Disposition: A | Discharge status: Home / Self Care | Payer: BLUE CROSS/BLUE SHIELD | Source: Ambulatory Visit | Attending: Radiation Oncology | Admitting: Radiation Oncology

## 2015-11-25 DIAGNOSIS — Z510 Encounter for antineoplastic radiation therapy: Secondary | ICD-10-CM | POA: Diagnosis not present

## 2015-11-26 ENCOUNTER — Ambulatory Visit
Admission: RE | Admit: 2015-11-26 | Discharge: 2015-11-26 | Disposition: A | Discharge status: Home / Self Care | Payer: BLUE CROSS/BLUE SHIELD | Source: Ambulatory Visit | Attending: Radiation Oncology | Admitting: Radiation Oncology

## 2015-11-26 ENCOUNTER — Ambulatory Visit: Payer: BLUE CROSS/BLUE SHIELD

## 2015-11-26 ENCOUNTER — Telehealth: Payer: Self-pay | Admitting: Oncology

## 2015-11-26 DIAGNOSIS — Z510 Encounter for antineoplastic radiation therapy: Secondary | ICD-10-CM | POA: Diagnosis not present

## 2015-11-26 NOTE — Telephone Encounter (Signed)
Spoke with patient on 11/30 - patient returned my call re echo and cannot do 12/7. Per patient she can do anytime 12/5 or 12/26 - patient informed that I would r/s to 12/5 or 12/6 and try to coordinate with xrt. Appointment r/s to 12/5 @ [facility] @ 3 pm - not able to coordinate an AM appointment with xrt on either day. Left message for patient re new date/time/location for echo.

## 2015-11-29 ENCOUNTER — Ambulatory Visit (HOSPITAL_COMMUNITY)
Admission: RE | Admit: 2015-11-29 | Discharge: 2015-11-29 | Disposition: A | Discharge status: Home / Self Care | Payer: BLUE CROSS/BLUE SHIELD | Source: Ambulatory Visit | Attending: Oncology | Admitting: Oncology

## 2015-11-29 ENCOUNTER — Ambulatory Visit
Admission: RE | Admit: 2015-11-29 | Discharge: 2015-11-29 | Disposition: A | Discharge status: Home / Self Care | Payer: BLUE CROSS/BLUE SHIELD | Source: Ambulatory Visit | Attending: Radiation Oncology | Admitting: Radiation Oncology

## 2015-11-29 ENCOUNTER — Ambulatory Visit: Payer: BLUE CROSS/BLUE SHIELD

## 2015-11-29 DIAGNOSIS — Z09 Encounter for follow-up examination after completed treatment for conditions other than malignant neoplasm: Secondary | ICD-10-CM | POA: Diagnosis not present

## 2015-11-29 DIAGNOSIS — I071 Rheumatic tricuspid insufficiency: Secondary | ICD-10-CM | POA: Insufficient documentation

## 2015-11-29 DIAGNOSIS — C50412 Malignant neoplasm of upper-outer quadrant of left female breast: Secondary | ICD-10-CM | POA: Diagnosis not present

## 2015-11-29 NOTE — Progress Notes (Signed)
  Echocardiogram 2D Echocardiogram has been performed.  Darlina Sicilian M 11/29/2015, 3:26 PM

## 2015-11-30 ENCOUNTER — Ambulatory Visit
Admission: RE | Admit: 2015-11-30 | Discharge: 2015-11-30 | Disposition: A | Discharge status: Home / Self Care | Payer: BLUE CROSS/BLUE SHIELD | Source: Ambulatory Visit | Attending: Radiation Oncology | Admitting: Radiation Oncology

## 2015-11-30 ENCOUNTER — Encounter: Payer: Self-pay | Admitting: Radiation Oncology

## 2015-11-30 VITALS — BP 107/52 | HR 68 | Temp 98.4°F | Resp 20 | Wt 182.3 lb

## 2015-11-30 DIAGNOSIS — Z510 Encounter for antineoplastic radiation therapy: Secondary | ICD-10-CM | POA: Diagnosis not present

## 2015-11-30 DIAGNOSIS — C50412 Malignant neoplasm of upper-outer quadrant of left female breast: Secondary | ICD-10-CM

## 2015-11-30 NOTE — Progress Notes (Signed)
Weekly rd tx left breast, very bright erythema, mild itching but has gotten better stated,  Using radaiplex daily, has sharp  pains in breast occasionally, appetite great. energy level is slowing down,  BP 107/52 mmHg  Pulse 68  Temp(Src) 98.4 F (36.9 C) (Oral)  Resp 20  Wt 182 lb 4.8 oz (82.691 kg)  Wt Readings from Last 3 Encounters:  11/30/15 182 lb 4.8 oz (82.691 kg)  11/16/15 180 lb (81.647 kg)  11/09/15 182 lb 14.4 oz (82.963 kg)

## 2015-11-30 NOTE — Progress Notes (Signed)
Weekly Management Note Current Dose: 51 Gy  Projected Dose: 61 Gy   Narrative:  The patient presents for routine under treatment assessment.  CBCT/MVCT images/Port film x-rays were reviewed.  The chart was checked. Left breast stable. Rash between breasts continues. Breast is sore.   Physical Findings: Weight: 182 lb 4.8 oz (82.691 kg). Mild red raised rash in between breasts and left > right inframammary fold. Pink around nipple and breast Dermatitis medially and superiorly.   Impression:  The patient is tolerating radiation.  Plan:  Continue treatment as planned. Continue nystatin. Continue radiaplex.

## 2015-11-30 NOTE — Progress Notes (Signed)
Weekly Management Note Current Dose: 41.4  Gy  Projected Dose: 61 Gy   Narrative:  The patient presents for routine under treatment assessment.  CBCT/MVCT images/Port film x-rays were reviewed.  The chart was checked. Left breast stable. Rash between breasts continues. More fatigue. Arm still numb with some weakness in hand.  Daughter is PTA and sent exercises so she cancelled PT appt.   Physical Findings: Weight: 182 lb 4.8 oz (82.691 kg). Mild red raised rash in between breasts and left > right inframammary fold. Pink around nipple. All stable. Dermatitis medially and superiorly.   Impression:  The patient is tolerating radiation.  Plan:  Continue treatment as planned. Continue nystatin. Continue radiaplex. Will call PT if she thinks arm/hand is getting worse.

## 2015-12-01 ENCOUNTER — Other Ambulatory Visit (HOSPITAL_COMMUNITY): Payer: BLUE CROSS/BLUE SHIELD

## 2015-12-01 ENCOUNTER — Ambulatory Visit
Admission: RE | Admit: 2015-12-01 | Discharge: 2015-12-01 | Disposition: A | Discharge status: Home / Self Care | Payer: BLUE CROSS/BLUE SHIELD | Source: Ambulatory Visit | Attending: Radiation Oncology | Admitting: Radiation Oncology

## 2015-12-01 DIAGNOSIS — Z510 Encounter for antineoplastic radiation therapy: Secondary | ICD-10-CM | POA: Diagnosis not present

## 2015-12-02 ENCOUNTER — Ambulatory Visit (HOSPITAL_BASED_OUTPATIENT_CLINIC_OR_DEPARTMENT_OTHER): Payer: BLUE CROSS/BLUE SHIELD

## 2015-12-02 ENCOUNTER — Encounter: Payer: Self-pay | Admitting: *Deleted

## 2015-12-02 ENCOUNTER — Ambulatory Visit
Admission: RE | Admit: 2015-12-02 | Discharge: 2015-12-02 | Disposition: A | Discharge status: Home / Self Care | Payer: BLUE CROSS/BLUE SHIELD | Source: Ambulatory Visit | Attending: Radiation Oncology | Admitting: Radiation Oncology

## 2015-12-02 ENCOUNTER — Other Ambulatory Visit (HOSPITAL_BASED_OUTPATIENT_CLINIC_OR_DEPARTMENT_OTHER): Payer: BLUE CROSS/BLUE SHIELD

## 2015-12-02 ENCOUNTER — Other Ambulatory Visit: Payer: Self-pay | Admitting: Oncology

## 2015-12-02 ENCOUNTER — Ambulatory Visit (HOSPITAL_BASED_OUTPATIENT_CLINIC_OR_DEPARTMENT_OTHER): Payer: BLUE CROSS/BLUE SHIELD | Admitting: Oncology

## 2015-12-02 VITALS — BP 118/77 | HR 96 | Temp 98.7°F | Resp 18 | Ht 68.0 in | Wt 180.3 lb

## 2015-12-02 DIAGNOSIS — Z5112 Encounter for antineoplastic immunotherapy: Secondary | ICD-10-CM

## 2015-12-02 DIAGNOSIS — C50412 Malignant neoplasm of upper-outer quadrant of left female breast: Secondary | ICD-10-CM

## 2015-12-02 DIAGNOSIS — H918X3 Other specified hearing loss, bilateral: Secondary | ICD-10-CM | POA: Diagnosis not present

## 2015-12-02 DIAGNOSIS — G43009 Migraine without aura, not intractable, without status migrainosus: Secondary | ICD-10-CM

## 2015-12-02 DIAGNOSIS — Z510 Encounter for antineoplastic radiation therapy: Secondary | ICD-10-CM | POA: Diagnosis not present

## 2015-12-02 DIAGNOSIS — H9193 Unspecified hearing loss, bilateral: Secondary | ICD-10-CM

## 2015-12-02 LAB — CBC WITH DIFFERENTIAL/PLATELET
BASO%: 0.6 % (ref 0.0–2.0)
BASOS ABS: 0 10*3/uL (ref 0.0–0.1)
EOS ABS: 0.1 10*3/uL (ref 0.0–0.5)
EOS%: 2.2 % (ref 0.0–7.0)
HEMATOCRIT: 39.1 % (ref 34.8–46.6)
HEMOGLOBIN: 13.2 g/dL (ref 11.6–15.9)
LYMPH#: 0.9 10*3/uL (ref 0.9–3.3)
LYMPH%: 19.7 % (ref 14.0–49.7)
MCH: 34.4 pg — AB (ref 25.1–34.0)
MCHC: 33.8 g/dL (ref 31.5–36.0)
MCV: 101.6 fL — AB (ref 79.5–101.0)
MONO#: 0.5 10*3/uL (ref 0.1–0.9)
MONO%: 10 % (ref 0.0–14.0)
NEUT%: 67.5 % (ref 38.4–76.8)
NEUTROS ABS: 3.1 10*3/uL (ref 1.5–6.5)
Platelets: 192 10*3/uL (ref 145–400)
RBC: 3.85 10*6/uL (ref 3.70–5.45)
RDW: 12.1 % (ref 11.2–14.5)
WBC: 4.5 10*3/uL (ref 3.9–10.3)

## 2015-12-02 MED ORDER — DIPHENHYDRAMINE HCL 25 MG PO CAPS
25.0000 mg | ORAL_CAPSULE | Freq: Once | ORAL | Status: AC
  Administered 2015-12-02: 25 mg via ORAL

## 2015-12-02 MED ORDER — ACETAMINOPHEN 325 MG PO TABS
650.0000 mg | ORAL_TABLET | Freq: Once | ORAL | Status: AC
  Administered 2015-12-02: 650 mg via ORAL

## 2015-12-02 MED ORDER — TRASTUZUMAB CHEMO INJECTION 440 MG
6.0000 mg/kg | Freq: Once | INTRAVENOUS | Status: AC
  Administered 2015-12-02: 483 mg via INTRAVENOUS
  Filled 2015-12-02: qty 23

## 2015-12-02 MED ORDER — HEPARIN SOD (PORK) LOCK FLUSH 100 UNIT/ML IV SOLN
500.0000 [IU] | Freq: Once | INTRAVENOUS | Status: AC | PRN
  Administered 2015-12-02: 500 [IU]
  Filled 2015-12-02: qty 5

## 2015-12-02 MED ORDER — SODIUM CHLORIDE 0.9 % IJ SOLN
10.0000 mL | INTRAMUSCULAR | Status: DC | PRN
  Administered 2015-12-02: 10 mL
  Filled 2015-12-02: qty 10

## 2015-12-02 MED ORDER — DIPHENHYDRAMINE HCL 25 MG PO CAPS
ORAL_CAPSULE | ORAL | Status: AC
  Filled 2015-12-02: qty 1

## 2015-12-02 MED ORDER — ACETAMINOPHEN 325 MG PO TABS
ORAL_TABLET | ORAL | Status: AC
  Filled 2015-12-02: qty 2

## 2015-12-02 MED ORDER — SODIUM CHLORIDE 0.9 % IV SOLN
Freq: Once | INTRAVENOUS | Status: AC
  Administered 2015-12-02: 11:00:00 via INTRAVENOUS

## 2015-12-02 NOTE — Patient Instructions (Signed)
[facility] Discharge Instructions for Patients Receiving Chemotherapy  Today you received the following chemotherapy agents herceptin   To help prevent nausea and vomiting after your treatment, we encourage you to take your nausea medication as directed   If you develop nausea and vomiting that is not controlled by your nausea medication, call the clinic.   BELOW ARE SYMPTOMS THAT SHOULD BE REPORTED IMMEDIATELY:  *FEVER GREATER THAN 100.5 F  *CHILLS WITH OR WITHOUT FEVER  NAUSEA AND VOMITING THAT IS NOT CONTROLLED WITH YOUR NAUSEA MEDICATION  *UNUSUAL SHORTNESS OF BREATH  *UNUSUAL BRUISING OR BLEEDING  TENDERNESS IN MOUTH AND THROAT WITH OR WITHOUT PRESENCE OF ULCERS  *URINARY PROBLEMS  *BOWEL PROBLEMS  UNUSUAL RASH Items with * indicate a potential emergency and should be followed up as soon as possible.  Feel free to call the clinic you have any questions or concerns. The clinic phone number is [phone].  

## 2015-12-02 NOTE — Progress Notes (Signed)
Cynthia [name]  [phone] [phone]     ID: Cynthia [name] DOB: November 06, 1966  MR#: [id number]  XNA#:[id number]  Patient Care Team: No Pcp Per Patient as PCP - General (General Practice) Rolm Bookbinder, MD as Consulting Physician (General Surgery) Chauncey Cruel, MD as Consulting Physician (Oncology) Kyung Rudd, MD as Consulting Physician (Radiation Oncology) Mauro Kaufmann, RN as Registered Nurse Rockwell Germany, RN as Registered Nurse PCP: No PCP Per Patient OTHER MD: Christene Slates M.D.  CHIEF COMPLAINT: Triple positive breast cancer  CURRENT TREATMENT: trastuzumab; adjuvant radiation   BREAST CANCER HISTORY: From the original intake note:  Cynthia [name] had noted a change in her left breast on self-exam the last week in March. She brought it to Dr. Trellis Paganini attention and on 04/13/2015 underwent diagnostic bilateral mammography and left ultrasonography at [facility]. This found the breast composition to be category D. An area of architectural distortion was noted in the left breast upper outer quadrant correlating to the palpable mass. By ultrasound there was a 2 cm lobulated mass in the left breast upper outer quadrant which was hypoechoic. There was a 4 mm nodule immediately adjacent to this felt to be a satellite lesion.  Biopsy of the left breast mass in question 04/14/2015 showed ([id number]) invasive ductal carcinoma, grade 2, estrogen receptor 90% positive, progesterone receptor 90% positive, with an MIB-1 of 20%, and HER-2 amplified, the signals ratio being 5.36.  The patient's subsequent history is as detailed below  INTERVAL HISTORY: Cynthia [name] returns today for follow up of her triple positive breast cancer. She is currently receiving radiation, which she tolerates generally well. She has a little bit more fatigued than usual and feels a little irritable as a result, but she is managing to do everything that she normally does. Her skin is "holding up. She  is also on Herceptin every 3 weeks. She has no side effects from that and her port is working well.    Importantly, she had a period last month. At the same time her hot flashes went away. She is likely recovering her ovarian function.  REVIEW OF SYSTEMS: Cynthia [name] noted some tinnitus and a was evaluated by ENT who documented some high decibel hearing loss. They felt this could be possibly due to her Platinol treatments. She does have ringing in her ears which makes it a little difficult for her at night when she is trying to go to sleep. She is going to get a tape to "cover that up". Aside from that she has mild stress urinary incontinence. A detailed review of systems today was otherwise noncontributory  PAST MEDICAL HISTORY: Past Medical History  Diagnosis Date  . Breast cancer of upper-outer quadrant of left female breast ([place]) 04/16/2015  . Anxiety   . Hot flashes   . Breast cancer ([place]) 03/2015    IDC of UOQ of left breast; ER/PR+, Her2+  . Skin cancer     Hx of melanoma at 14y; multiple basal cell carcinomas first dx in late 52s   melanoma, removed in [place]   PAST SURGICAL HISTORY: Past Surgical History  Procedure Laterality Date  . Bladder suspension  2009  . Dnc    . Dilation and curettage of uterus    . Portacath placement Right 04/27/2015    Procedure: INSERTION PORT-A-CATH;  Surgeon: Rolm Bookbinder, MD;  Location: [place];  Service: General;  Laterality: Right;  . Radioactive seed guided mastectomy with axillary sentinel lymph node biopsy Left 09/21/2015  Procedure: RADIOACTIVE SEED GUIDED LUMPECTOMY WITH AXILLARY SENTINEL LYMPH NODE BIOPSY;  Surgeon: Rolm Bookbinder, MD;  Location: [place];  Service: General;  Laterality: Left;    FAMILY HISTORY Family History  Problem Relation Age of Onset  . Breast cancer Maternal Aunt     dx. 20s  . Lung cancer Maternal Grandmother     dx. late 50s-early 60s; "metastasis to breast"; smoker    . Lung cancer Paternal Grandfather     dx. 28s; smokeless tobacco user  . Other Mother     hx of TAH  . Colon polyps Father     unspecified amt  . Melanoma Maternal Uncle 64  . Heart Problems Paternal Grandmother   . Diabetes Paternal Grandmother   . Melanoma Other 76   the patient's parents are still living as of April 2016. They are divorced. The patient is an only child. The patient's paternal grandfather and maternal grandmother had a history of lung cancer. On the mother's side there is a history of melanoma. The patient's mother's only sister was diagnosed with breast cancer in her 37s. There is no history of ovarian cancer in the family.   GYNECOLOGIC HISTORY:  No LMP recorded.  menarche age 45, first live birth age 79, the patient is GX P3. Her periods are ongoing but irregular. She has a Mirena in place for contraception. She took oral contraceptives for approximately 15 years in the past with no complications.   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 She is single, and lives at home with her father, who is disabled and has a history of depression. At home also is the patient's [name], 50 years old as of April 2016. The patient's 2 other daughters are [name], lives in [place] and is a physical therapy technician, and [name], lives in [facility] and works in Press photographer.     ADVANCED DIRECTIVES:  not in place.    HEALTH MAINTENANCE: Social History  Substance Use Topics  . Smoking status: Never Smoker   . Smokeless tobacco: Never Used  . Alcohol Use: Yes     Comment: occasion drink; only smoked cigs socially while in college     Colonoscopy: never   PAP:  Bone density: never   Lipid panel:  Allergies  Allergen Reactions  . Zofran [Ondansetron Hcl] Other (See Comments)    headache  . Adhesive [Tape] Rash    Steri strip    Current Outpatient Prescriptions  Medication Sig Dispense Refill  . ALPRAZolam  (XANAX) 1 MG tablet     . famotidine (PEPCID) 20 MG tablet Take 1 tablet (20 mg total) by mouth 2 (two) times daily. (Patient not taking: Reported on 11/16/2015) 30 tablet 0  . gabapentin (NEURONTIN) 300 MG capsule Take 1 capsule (300 mg total) by mouth at bedtime. 90 capsule 3  . hyaluronate sodium (RADIAPLEXRX) GEL Apply 1 application topically 2 (two) times daily.    Marland Kitchen loratadine (CLARITIN) 10 MG tablet Take 1 tablet (10 mg total) by mouth daily. 90 tablet 2  . minocycline (MINOCIN) 100 MG capsule Take 1 capsule (100 mg total) by mouth 2 (two) times daily. 100 capsule 3  . Multiple Vitamins-Minerals (CENTRUM SILVER PO) Take by mouth.    . non-metallic deodorant Jethro Poling) MISC Apply 1 application topically daily as needed.    . nystatin cream (MYCOSTATIN) Apply 1 application topically 2 (two) times daily. 30 g 0  . venlafaxine XR (  EFFEXOR-XR) 75 MG 24 hr capsule Take 1 capsule (75 mg total) by mouth daily with breakfast. 90 capsule 1   No current facility-administered medications for this visit.    OBJECTIVE: middle-aged white woman in no acute distress Filed Vitals:   12/02/15 1037  BP: 118/77  Pulse: 96  Temp: 98.7 F (37.1 C)  Resp: 18     Body mass index is 27.42 kg/(m^2).    ECOG FS:1 - Symptomatic but completely ambulatory  Sclerae unicteric, pupils round and equal Oropharynx clear and moist-- no thrush or other lesions No cervical or supraclavicular adenopathy Lungs no rales or rhonchi Heart regular rate and rhythm Abd soft, nontender, positive bowel sounds MSK no focal spinal tenderness, no upper extremity lymphedema Neuro: nonfocal, well oriented, appropriate affect Breasts: the left breast is status post lumpectomy. It is slightly larger than the right. The overall cosmetic result is excellent. The incisions are healing well, without dehiscence, erythema, or swelling of the left axilla is benign. Skin: There is a maculopapular acneform rash scattered between the breasts. It  crosses the midline  LAB RESULTS:  CMP     Component Value Date/Time   NA 141 09/30/2015 1008   K 4.0 09/30/2015 1008   CO2 27 09/30/2015 1008   GLUCOSE 78 09/30/2015 1008   BUN 15.2 09/30/2015 1008   CREATININE 0.7 09/30/2015 1008   CALCIUM 9.2 09/30/2015 1008   PROT 6.1* 09/30/2015 1008   ALBUMIN 3.3* 09/30/2015 1008   AST 15 09/30/2015 1008   ALT 13 09/30/2015 1008   ALKPHOS 101 09/30/2015 1008   BILITOT 0.30 09/30/2015 1008    INo results found for: SPEP, UPEP  Lab Results  Component Value Date   WBC 4.5 12/02/2015   NEUTROABS 3.1 12/02/2015   HGB 13.2 12/02/2015   HCT 39.1 12/02/2015   MCV 101.6* 12/02/2015   PLT 192 12/02/2015      Chemistry      Component Value Date/Time   NA 141 09/30/2015 1008   K 4.0 09/30/2015 1008   CO2 27 09/30/2015 1008   BUN 15.2 09/30/2015 1008   CREATININE 0.7 09/30/2015 1008      Component Value Date/Time   CALCIUM 9.2 09/30/2015 1008   ALKPHOS 101 09/30/2015 1008   AST 15 09/30/2015 1008   ALT 13 09/30/2015 1008   BILITOT 0.30 09/30/2015 1008       No results found for: LABCA2  No components found for: LABCA125  No results for input(s): INR in the last 168 hours.  Urinalysis No results found for: COLORURINE, APPEARANCEUR, LABSPEC, PHURINE, GLUCOSEU, HGBUR, BILIRUBINUR, KETONESUR, PROTEINUR, UROBILINOGEN, NITRITE, LEUKOCYTESUR  STUDIES: ------------------------------------------------------------------- Transthoracic Echocardiography  Patient:  Cynthia [name], Cynthia [name] MR #:    [id number] Study Date: 11/29/2015 Gender:   F Age:    49 Height:   172.7 cm Weight:   80.3 kg BSA:    1.98 m^2 Pt. Status: Room:  ATTENDING  Jerah Esty, Valli Glance   Johaan Ryser, Virgie Dad REFERRING  Detta Mellin, Virgie Dad PERFORMING  [facility], Outpatient SONOGRAPHER Darlina Sicilian, RDCS  cc:  ------------------------------------------------------------------- LV EF: 60% -  65%  ASSESSMENT: 49 y.o. [place] woman status post left breast biopsy 04/14/2015 for a clinical T2 N0, stage IIA invasive ductal carcinoma, grade 2, estrogen and progesterone receptor positive, with HER-2 amplified, and an MIB-1 of 20%  (1). 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  (3) status post left lumpectomy and sentinel lung lymph node sampling 09/21/2015 for a ypT1c ypN0 Invasive ductal carcinoma, grade 2, with negative margins.  (4) radiation to be completed 12/07/2015  (5) anti-estrogens to follow radiation  (6) genetics testing 09/06/2015 through the 8-gene Breast High/Moderate Risk Panel offered [facility] (Hope Pigeon, MD) found no deleterious mutations in ATM, BRCA1, BRCA2, CDH1, CHEK2, PALB2, PTEN, and TP53.   PLAN: Trenna is tolerating the radiation generally well, with erythema but no significant desquamation. She remains very functional. She is also tolerating the Herceptin with no side effects that she is aware of. Her most recent echo remains normal  I do think the hearing loss that she experiences, and tinnitus, may be related to the carboplatin. This is the least October toxic platinum compound but nevertheless hearing problems have been associated with this drug. I do not think she is going to have significant improvement. We discussed some tapes she can use at night to distract her hearing and help her sleep  Since her ovaries now appear to be back in working condition, she does need to consider contraception. She had a Mirena previously and tolerated this well. Arnoldo Hooker works  particularly well with tamoxifen which is what were going to start her on in mid January. She will be seeing her gynecologist shortly after that to get the Mirenareplaced.  I am going to see her again late January, and at that time we will start tamoxifen. The fact that she just had a period tells [place] we are not ready to go to an aromatase inhibitor, but we will probably make that change after tamoxifen for 2 years.  She knows to call for any problems that may develop before her next visit here. Chauncey Cruel, MD   12/02/2015 10:43 AM

## 2015-12-03 ENCOUNTER — Ambulatory Visit
Admission: RE | Admit: 2015-12-03 | Discharge: 2015-12-03 | Disposition: A | Discharge status: Home / Self Care | Payer: BLUE CROSS/BLUE SHIELD | Source: Ambulatory Visit | Attending: Radiation Oncology | Admitting: Radiation Oncology

## 2015-12-03 ENCOUNTER — Other Ambulatory Visit: Payer: Self-pay | Admitting: *Deleted

## 2015-12-03 DIAGNOSIS — Z510 Encounter for antineoplastic radiation therapy: Secondary | ICD-10-CM | POA: Diagnosis not present

## 2015-12-06 ENCOUNTER — Ambulatory Visit: Payer: BLUE CROSS/BLUE SHIELD | Admitting: Radiation Oncology

## 2015-12-06 ENCOUNTER — Encounter: Payer: BLUE CROSS/BLUE SHIELD | Admitting: Radiation Oncology

## 2015-12-06 ENCOUNTER — Ambulatory Visit
Admission: RE | Admit: 2015-12-06 | Discharge: 2015-12-06 | Disposition: A | Discharge status: Home / Self Care | Payer: BLUE CROSS/BLUE SHIELD | Source: Ambulatory Visit | Attending: Radiation Oncology | Admitting: Radiation Oncology

## 2015-12-06 DIAGNOSIS — Z510 Encounter for antineoplastic radiation therapy: Secondary | ICD-10-CM | POA: Diagnosis not present

## 2015-12-07 ENCOUNTER — Ambulatory Visit
Admission: RE | Admit: 2015-12-07 | Discharge: 2015-12-07 | Disposition: A | Discharge status: Home / Self Care | Payer: BLUE CROSS/BLUE SHIELD | Source: Ambulatory Visit | Attending: Radiation Oncology | Admitting: Radiation Oncology

## 2015-12-07 ENCOUNTER — Ambulatory Visit: Payer: BLUE CROSS/BLUE SHIELD

## 2015-12-07 ENCOUNTER — Encounter: Payer: Self-pay | Admitting: Radiation Oncology

## 2015-12-07 VITALS — BP 119/55 | HR 86 | Temp 97.9°F | Ht 60.8 in | Wt 182.6 lb

## 2015-12-07 DIAGNOSIS — C50412 Malignant neoplasm of upper-outer quadrant of left female breast: Secondary | ICD-10-CM

## 2015-12-07 DIAGNOSIS — Z510 Encounter for antineoplastic radiation therapy: Secondary | ICD-10-CM | POA: Diagnosis not present

## 2015-12-07 NOTE — Progress Notes (Signed)
Cynthia [name] has received 33 fractions.  Reports her appetite is good. Energy level is not bad.  Skin color is red to dark hyperpigmentation using the Radiaplex gel.  Has not been contacted yet about by the survivorship nurse told that she would receive a call in the next week.  Asked to purchase a lotion with vitamin E.  Has a F/U appointment with medical oncologist  12-23-15.  One month F/U appointment made for 01-20-2016 .  BP 119/55 mmHg  Pulse 86  Temp(Src) 97.9 F (36.6 C) (Oral)  Ht 5' 0.8" (1.544 m)  Wt 182 lb 9.6 oz (82.827 kg)  BMI 34.74 kg/m2  SpO2 98%  Wt Readings from Last 3 Encounters:  12/07/15 182 lb 9.6 oz (82.827 kg)  12/02/15 180 lb 4.8 oz (81.784 kg)  11/30/15 182 lb 4.8 oz (82.691 kg)

## 2015-12-07 NOTE — Addendum Note (Signed)
Encounter addended by: Malena Edman, RN on: 12/07/2015  5:40 PM<BR>     Documentation filed: Inpatient Patient Education

## 2015-12-07 NOTE — Progress Notes (Signed)
  Radiation Oncology         [phone] ________________________________  Name: Cynthia [name] MRN: [id number]  Date: 12/07/2015  DOB: July 26, 1966  End of Treatment Note  Diagnosis:   Breast cancer of upper-outer quadrant of left female breast [facility])   Staging form: Breast, AJCC 7th Edition     Clinical stage from 04/21/2015: Stage IIA (T2, N0, M0) - Unsigned      Indication for treatment:  Curative      Radiation treatment dates:   10/27-12/13/16  Site/dose:   Left breast/ 45 Gy at 1.8 Gy per fraction x 25 fractions.  Left breast boost/ 16 Gy at 2 Gy per fraction x 8 fractions  Beams/energy:  Opposed tangents with reduced fields / 6 MV photons Enface electrons / 15 MeV electrons.   Narrative: The patient tolerated radiation treatment relatively well.   She had some hyperpigmentation as expected and low energy. She was able to complete treatment as scheduled. Her periods had returned at the end of her treatment. She was weaning off Effexor and gabapentin.   Plan: The patient has completed radiation treatment. The patient will return to radiation oncology clinic for routine followup in one month. I advised them to call or return sooner if they have any questions or concerns related to their recovery or treatment.  ------------------------------------------------  Thea Silversmith, MD

## 2015-12-08 ENCOUNTER — Ambulatory Visit: Payer: BLUE CROSS/BLUE SHIELD

## 2015-12-09 ENCOUNTER — Telehealth: Payer: Self-pay | Admitting: *Deleted

## 2015-12-09 NOTE — Telephone Encounter (Signed)
Spoke with patient to follow up after radiation completion.  She is so glad to be done with radiation.  She is continuing herceptin to complete a year.  Encouraged her to call with any needs or concerns.

## 2015-12-13 DIAGNOSIS — Z510 Encounter for antineoplastic radiation therapy: Secondary | ICD-10-CM | POA: Diagnosis not present

## 2015-12-23 ENCOUNTER — Other Ambulatory Visit (HOSPITAL_BASED_OUTPATIENT_CLINIC_OR_DEPARTMENT_OTHER): Payer: BLUE CROSS/BLUE SHIELD

## 2015-12-23 ENCOUNTER — Ambulatory Visit (HOSPITAL_BASED_OUTPATIENT_CLINIC_OR_DEPARTMENT_OTHER): Payer: BLUE CROSS/BLUE SHIELD

## 2015-12-23 VITALS — BP 119/45 | HR 84 | Temp 97.9°F | Resp 18

## 2015-12-23 DIAGNOSIS — Z5111 Encounter for antineoplastic chemotherapy: Secondary | ICD-10-CM

## 2015-12-23 DIAGNOSIS — C50412 Malignant neoplasm of upper-outer quadrant of left female breast: Secondary | ICD-10-CM | POA: Diagnosis not present

## 2015-12-23 DIAGNOSIS — G43009 Migraine without aura, not intractable, without status migrainosus: Secondary | ICD-10-CM

## 2015-12-23 LAB — CBC WITH DIFFERENTIAL/PLATELET
BASO%: 0.4 % (ref 0.0–2.0)
BASOS ABS: 0 10*3/uL (ref 0.0–0.1)
EOS%: 2.6 % (ref 0.0–7.0)
Eosinophils Absolute: 0.1 10*3/uL (ref 0.0–0.5)
HEMATOCRIT: 39.7 % (ref 34.8–46.6)
HGB: 13.4 g/dL (ref 11.6–15.9)
LYMPH#: 1.4 10*3/uL (ref 0.9–3.3)
LYMPH%: 29.2 % (ref 14.0–49.7)
MCH: 33.5 pg (ref 25.1–34.0)
MCHC: 33.8 g/dL (ref 31.5–36.0)
MCV: 99.3 fL (ref 79.5–101.0)
MONO#: 0.4 10*3/uL (ref 0.1–0.9)
MONO%: 9.5 % (ref 0.0–14.0)
NEUT#: 2.7 10*3/uL (ref 1.5–6.5)
NEUT%: 58.3 % (ref 38.4–76.8)
PLATELETS: 193 10*3/uL (ref 145–400)
RBC: 4 10*6/uL (ref 3.70–5.45)
RDW: 12 % (ref 11.2–14.5)
WBC: 4.7 10*3/uL (ref 3.9–10.3)

## 2015-12-23 MED ORDER — HEPARIN SOD (PORK) LOCK FLUSH 100 UNIT/ML IV SOLN
500.0000 [IU] | Freq: Once | INTRAVENOUS | Status: AC | PRN
  Administered 2015-12-23: 500 [IU]
  Filled 2015-12-23: qty 5

## 2015-12-23 MED ORDER — ACETAMINOPHEN 325 MG PO TABS
650.0000 mg | ORAL_TABLET | Freq: Once | ORAL | Status: AC
  Administered 2015-12-23: 650 mg via ORAL

## 2015-12-23 MED ORDER — DIPHENHYDRAMINE HCL 25 MG PO CAPS: 25.0000 mg | ORAL_CAPSULE | Freq: Once | ORAL | Status: DC

## 2015-12-23 MED ORDER — ACETAMINOPHEN 325 MG PO TABS
ORAL_TABLET | ORAL | Status: AC
  Filled 2015-12-23: qty 2

## 2015-12-23 MED ORDER — TRASTUZUMAB CHEMO INJECTION 440 MG
6.0000 mg/kg | Freq: Once | INTRAVENOUS | Status: AC
  Administered 2015-12-23: 483 mg via INTRAVENOUS
  Filled 2015-12-23: qty 23

## 2015-12-23 MED ORDER — SODIUM CHLORIDE 0.9 % IV SOLN
Freq: Once | INTRAVENOUS | Status: AC
  Administered 2015-12-23: 11:00:00 via INTRAVENOUS

## 2015-12-23 MED ORDER — SODIUM CHLORIDE 0.9 % IJ SOLN
10.0000 mL | INTRAMUSCULAR | Status: DC | PRN
  Administered 2015-12-23: 10 mL
  Filled 2015-12-23: qty 10

## 2015-12-23 NOTE — Patient Instructions (Signed)
[facility] Discharge Instructions for Patients  Today you received the following: Herceptin   To help prevent nausea and vomiting after your treatment, we encourage you to take your nausea medication as directed.   If you develop nausea and vomiting that is not controlled by your nausea medication, call the clinic.   BELOW ARE SYMPTOMS THAT SHOULD BE REPORTED IMMEDIATELY:  *FEVER GREATER THAN 100.5 F  *CHILLS WITH OR WITHOUT FEVER  NAUSEA AND VOMITING THAT IS NOT CONTROLLED WITH YOUR NAUSEA MEDICATION  *UNUSUAL SHORTNESS OF BREATH  *UNUSUAL BRUISING OR BLEEDING  TENDERNESS IN MOUTH AND THROAT WITH OR WITHOUT PRESENCE OF ULCERS  *URINARY PROBLEMS  *BOWEL PROBLEMS  UNUSUAL RASH Items with * indicate a potential emergency and should be followed up as soon as possible.  Feel free to call the clinic you have any questions or concerns. The clinic phone number is [phone].  Please show the CHEMO ALERT CARD at check-in to the Emergency Department and triage nurse.   

## 2015-12-26 DIAGNOSIS — Z923 Personal history of irradiation: Secondary | ICD-10-CM

## 2015-12-26 DIAGNOSIS — K5792 Diverticulitis of intestine, part unspecified, without perforation or abscess without bleeding: Secondary | ICD-10-CM

## 2015-12-26 DIAGNOSIS — Z862 Personal history of diseases of the blood and blood-forming organs and certain disorders involving the immune mechanism: Secondary | ICD-10-CM

## 2015-12-26 HISTORY — DX: Personal history of irradiation: Z92.3

## 2015-12-26 HISTORY — DX: Diverticulitis of intestine, part unspecified, without perforation or abscess without bleeding: K57.92

## 2015-12-26 HISTORY — PX: ORIF CLAVICLE FRACTURE: SUR924

## 2015-12-26 HISTORY — DX: Personal history of diseases of the blood and blood-forming organs and certain disorders involving the immune mechanism: Z86.2

## 2015-12-28 NOTE — Progress Notes (Signed)
Name: Cynthia [name]   MRN: [id number]  Date:  11/14/2015   DOB: June 22, 1966  Status:outpatient    DIAGNOSIS: Breast cancer of upper-outer quadrant of left female breast [facility])   Staging form: Breast, AJCC 7th Edition     Clinical stage from 04/21/2015: Stage IIA (T2, N0, M0) - Unsigned   CONSENT VERIFIED: yes   SET UP: Patient is setup supine   IMMOBILIZATION:  The following immobilization was used:Custom Moldable Pillow, breast board.   NARRATIVE: Cynthia [name] underwent complex simulation and treatment planning for her boost treatment today.  Her tumor volume was outlined on the planning CT scan. The depth of her cavity was felt to be appropriate for treatment with electrons    15  MeV electrons will be prescribed to the 100%isodose line.   I personally oversaw and approved the construction of a unique block which will be used for beam modification purposes.  An isodose plan is requested.

## 2016-01-12 ENCOUNTER — Ambulatory Visit (HOSPITAL_BASED_OUTPATIENT_CLINIC_OR_DEPARTMENT_OTHER): Payer: BLUE CROSS/BLUE SHIELD | Admitting: Oncology

## 2016-01-12 ENCOUNTER — Telehealth: Payer: Self-pay | Admitting: Oncology

## 2016-01-12 VITALS — BP 137/80 | HR 89 | Temp 98.2°F | Resp 20 | Ht 60.8 in | Wt 184.5 lb

## 2016-01-12 DIAGNOSIS — C50412 Malignant neoplasm of upper-outer quadrant of left female breast: Secondary | ICD-10-CM

## 2016-01-12 MED ORDER — TAMOXIFEN CITRATE 20 MG PO TABS: 20.0000 mg | ORAL_TABLET | Freq: Every day | ORAL | Status: AC

## 2016-01-12 MED ORDER — GABAPENTIN 300 MG PO CAPS: 300.0000 mg | ORAL_CAPSULE | Freq: Every day | ORAL | Status: DC

## 2016-01-12 NOTE — Telephone Encounter (Signed)
Appointments made and avs printed for patient and echo to [place] for precert

## 2016-01-12 NOTE — Progress Notes (Signed)
[place]  Telephone:([phone]) [phone] Fax:([phone]) [phone]     ID: Cynthia [name] DOB: 03-03-1966  MR#: [id number]  DDU#:[id number]  Patient Care Team: No Pcp Per Patient as PCP - General (General Practice) Rolm Bookbinder, MD as Consulting Physician (General Surgery) Chauncey Cruel, MD as Consulting Physician (Oncology) Kyung Rudd, MD as Consulting Physician (Radiation Oncology) Mauro Kaufmann, RN as Registered Nurse Rockwell Germany, RN as Registered Nurse PCP: No PCP Per Patient OTHER MD: Christene Slates M.D.  CHIEF COMPLAINT: Triple positive breast cancer  CURRENT TREATMENT: trastuzumab; pending antiestrogen therapy   BREAST CANCER HISTORY: From the original intake note:  Cynthia [name] had noted a change in her left breast on self-exam the last week in March. She brought it to Dr. Trellis Paganini attention and on 04/13/2015 underwent diagnostic bilateral mammography and left ultrasonography at [facility]. This found the breast composition to be category D. An area of architectural distortion was noted in the left breast upper outer quadrant correlating to the palpable mass. By ultrasound there was a 2 cm lobulated mass in the left breast upper outer quadrant which was hypoechoic. There was a 4 mm nodule immediately adjacent to this felt to be a satellite lesion.  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  The patient's subsequent history is as detailed below  INTERVAL HISTORY: Cynthia [name] returns today for follow up of her triple positive, early stage breast cancer. Since her last visit here she completed her radiation treatments. In general she did well with those, with minimal fatigue. Since completing the treatments however she has become much more "emotional". She will have a lot of  energy 1 day and be really down the next day. When she has a lot of energy, like this weekend, she went to 1 activity after another including watching monster trucks.when she is down she cries a lot and in fact she cries very easily. She is also irritable. She took herself off the venlafaxine some weeks ago. She also took herself off gabapentin. As the hot flashes resumed she put herself back on the nighttime gabapentin and that is helping with the nighttime hot flashes.   REVIEW OF SYSTEMS: Cynthia [name] still has some ringing in her years. When she is very active she stops noticing it. She has gained a little bit of weight. She still feels tired at times. All the time she has excess energy. Just a little bit or any nose, can be short of breath at times, and sleeps on 2 pillows. She has a strange feeling over the dorsum of both hands. It is more like swelling, although visually there is no swelling. She doesn't have neuropathy otherwise.On the plus side she really likes her hair. A detailed review of systems today was otherwise stable  PAST MEDICAL HISTORY: Past Medical History  Diagnosis Date  . Breast cancer of upper-outer quadrant of left female breast ([place]) 04/16/2015  . Anxiety   . Hot flashes   . Breast cancer ([place]) 03/2015    IDC of UOQ of left breast; ER/PR+, Her2+  . Skin cancer     Hx of melanoma at 50y; multiple basal cell carcinomas first dx in late 48s   melanoma, removed in [place]   PAST SURGICAL HISTORY: Past Surgical History  Procedure Laterality Date  . Bladder suspension  2009  .  Dnc    . Dilation and curettage of uterus    . Portacath placement Right 04/27/2015    Procedure: INSERTION PORT-A-CATH;  Surgeon: Rolm Bookbinder, MD;  Location: [facility];  Service: General;  Laterality: Right;  . Radioactive seed guided mastectomy with axillary sentinel lymph node biopsy Left 09/21/2015    Procedure: RADIOACTIVE SEED GUIDED LUMPECTOMY WITH AXILLARY SENTINEL LYMPH  NODE BIOPSY;  Surgeon: Rolm Bookbinder, MD;  Location: [facility];  Service: General;  Laterality: Left;    FAMILY HISTORY Family History  Problem Relation Age of Onset  . Breast cancer Maternal Aunt     dx. 36s  . Lung cancer Maternal Grandmother     dx. late 50s-early 60s; "metastasis to breast"; smoker  . Lung cancer Paternal Grandfather     dx. 47s; smokeless tobacco user  . Other Mother     hx of TAH  . Colon polyps Father     unspecified amt  . Melanoma Maternal Uncle 64  . Heart Problems Paternal Grandmother   . Diabetes Paternal Grandmother   . Melanoma Other 47   the patient's parents are still living as of April 2016. They are divorced. The patient is an only child. The patient's paternal grandfather and maternal grandmother had a history of lung cancer. On the mother's side there is a history of melanoma. The patient's mother's only sister was diagnosed with breast cancer in her 26s. There is no history of ovarian cancer in the family.   GYNECOLOGIC HISTORY:  No LMP recorded.  menarche age 64, first live birth age 72, the patient is GX P3. Her periods are ongoing but irregular. She has a Mirena in place for contraception. She took oral contraceptives for approximately 15 years in the past with no complications.   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 She is single, and lives at home with her father, who is disabled and has a history of depression. At home also is the patient's daughter [name] "Cynthia [name], 14 years old as of April 2016. The patient's 2 other daughters are Cynthia [name], lives in [place] and is a physical therapy technician, and Cynthia [name], lives in [facility] and works in Press photographer.     ADVANCED DIRECTIVES:  not in place.    HEALTH MAINTENANCE: Social History  Substance Use Topics  . Smoking status: Never Smoker   . Smokeless tobacco: Never Used  . Alcohol Use: Yes     Comment: occasion drink; only  smoked cigs socially while in college     Colonoscopy: never   PAP:  Bone density: never   Lipid panel:  Allergies  Allergen Reactions  . Zofran [Ondansetron Hcl] Other (See Comments)    headache  . Adhesive [Tape] Rash    Steri strip    Current Outpatient Prescriptions  Medication Sig Dispense Refill  . ALPRAZolam (XANAX) 1 MG tablet     . famotidine (PEPCID) 20 MG tablet Take 1 tablet (20 mg total) by mouth 2 (two) times daily. (Patient not taking: Reported on 11/16/2015) 30 tablet 0  . hyaluronate sodium (RADIAPLEXRX) GEL Apply 1 application topically 2 (two) times daily.    Marland Kitchen loratadine (CLARITIN) 10 MG tablet Take 1 tablet (10 mg total) by mouth daily. 90 tablet 2  . minocycline (MINOCIN) 100 MG capsule Take 1 capsule (100 mg total) by mouth 2 (two) times daily. 100 capsule 3  . Multiple Vitamins-Minerals (CENTRUM SILVER PO) Take by  mouth.    . non-metallic deodorant (ALRA) MISC Apply 1 application topically daily as needed.    . nystatin cream (MYCOSTATIN) Apply 1 application topically 2 (two) times daily. 30 g 0  . venlafaxine XR (EFFEXOR-XR) 75 MG 24 hr capsule Take 1 capsule (75 mg total) by mouth daily with breakfast. 90 capsule 1   No current facility-administered medications for this visit.    OBJECTIVE: middle-aged white woman who appears well  Filed Vitals:   01/12/16 1558  BP: 137/80  Pulse: 89  Temp: 98.2 F (36.8 C)  Resp: 20     Body mass index is 35.11 kg/(m^2).    ECOG FS:1 - Symptomatic but completely ambulatory  Sclerae unicteric, pupils round and equal Oropharynx clear and moist-- no thrush or other lesions No cervical or supraclavicular adenopathy Lungs no rales or rhonchi Heart regular rate and rhythm Abd soft, nontender, positive bowel sounds MSK no focal spinal tenderness, no upper extremity lymphedema Neuro: nonfocal, well oriented, appropriate affect Breasts: the right breast is unremarkable. The left breast is status post lumpectomy and  radiation. There is mild hyperpigmentation or the boost site. Overall the cosmetic result is excellent. There is no evidence of local recurrence. The left axilla is benign.   LAB RESULTS:  CMP     Component Value Date/Time   NA 141 09/30/2015 1008   K 4.0 09/30/2015 1008   CO2 27 09/30/2015 1008   GLUCOSE 78 09/30/2015 1008   BUN 15.2 09/30/2015 1008   CREATININE 0.7 09/30/2015 1008   CALCIUM 9.2 09/30/2015 1008   PROT 6.1* 09/30/2015 1008   ALBUMIN 3.3* 09/30/2015 1008   AST 15 09/30/2015 1008   ALT 13 09/30/2015 1008   ALKPHOS 101 09/30/2015 1008   BILITOT 0.30 09/30/2015 1008    INo results found for: SPEP, UPEP  Lab Results  Component Value Date   WBC 4.7 12/23/2015   NEUTROABS 2.7 12/23/2015   HGB 13.4 12/23/2015   HCT 39.7 12/23/2015   MCV 99.3 12/23/2015   PLT 193 12/23/2015      Chemistry      Component Value Date/Time   NA 141 09/30/2015 1008   K 4.0 09/30/2015 1008   CO2 27 09/30/2015 1008   BUN 15.2 09/30/2015 1008   CREATININE 0.7 09/30/2015 1008      Component Value Date/Time   CALCIUM 9.2 09/30/2015 1008   ALKPHOS 101 09/30/2015 1008   AST 15 09/30/2015 1008   ALT 13 09/30/2015 1008   BILITOT 0.30 09/30/2015 1008       No results found for: LABCA2  No components found for: LABCA125  No results for input(s): INR in the last 168 hours.  Urinalysis No results found for: COLORURINE, APPEARANCEUR, LABSPEC, PHURINE, GLUCOSEU, HGBUR, BILIRUBINUR, KETONESUR, PROTEINUR, UROBILINOGEN, NITRITE, LEUKOCYTESUR  STUDIES: ------------------------------------------------------------------- Transthoracic Echocardiography  Patient:  Cynthia [name], Cynthia [name] MR #:    [id number] Study Date: 11/29/2015 Gender:   F Age:    50 Height:   172.7 cm Weight:   80.3 kg BSA:    1.98 m^2 Pt. Status: Room:  ATTENDING  Magrinat, Valli Glance   Magrinat, Virgie Dad REFERRING  Magrinat, Virgie Dad PERFORMING  [facility],  Outpatient SONOGRAPHER Darlina Sicilian, RDCS  cc:  ------------------------------------------------------------------- LV EF: 60% -  65%  ASSESSMENT: 50 y.o. [place] woman status post left breast biopsy 04/14/2015 for a clinical T2 N0, stage IIA invasive ductal carcinoma, grade 2, estrogen and progesterone receptor positive, with HER-2 amplified, and an MIB-1 of 20%  (  1). 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  (3) status post left lumpectomy and sentinel lung lymph node sampling 09/21/2015 for a ypT1c ypN0 Invasive ductal carcinoma, grade 2, with negative margins.  (4) radiation 10/27-12/13/16; Left breast/ 45 Gy at 1.8 Gy per fraction x 25 fractions.  Left breast boost/ 16 Gy at 2 Gy per fraction x 8 fractions  (5) tamoxifen to start 02/08/2016  (6) genetics testing 09/06/2015 through the 8-gene Breast High/Moderate Risk Panel offered [facility] (Hope Pigeon, MD) found no deleterious mutations in ATM, BRCA1, BRCA2, CDH1, CHEK2, PALB2, PTEN, and TP53.   PLAN: Lashondra is going through a difficult time emotionally at this point. This is not at all uncommon at this point in her journey. In addition to some "fogginess" or mild cognitive impairment, and emotional lability, there is some evidence of posttraumatic stress.  I think it would be a good idea for her to get back on the venlafaxine at 75 mg daily. It would make things much more stable. She already put herself back on the nighttime gabapentin and that is helping the nighttime hot flashes.  I'm reluctant  to start tamoxifen at this point because she may well attribute some of the symptoms she is experiencing to this drug. I went ahead and placed a prescription was suggested she not started until [place] today.  She will then see me again in late March. She will have a repeat echocardiogram prior to that visit. Of course we are continuing the Herceptin through mid May of this year.  She knows to call for any problems that may develop before her next visit here.  Chauncey Cruel, MD   01/12/2016 4:22 PM

## 2016-01-13 ENCOUNTER — Inpatient Hospital Stay
Admission: RE | Admit: 2016-01-13 | Payer: BLUE CROSS/BLUE SHIELD | Source: Ambulatory Visit | Admitting: Radiation Oncology

## 2016-01-13 ENCOUNTER — Ambulatory Visit (HOSPITAL_BASED_OUTPATIENT_CLINIC_OR_DEPARTMENT_OTHER): Payer: BLUE CROSS/BLUE SHIELD

## 2016-01-13 ENCOUNTER — Ambulatory Visit: Payer: BLUE CROSS/BLUE SHIELD | Admitting: Radiation Oncology

## 2016-01-13 ENCOUNTER — Other Ambulatory Visit (HOSPITAL_BASED_OUTPATIENT_CLINIC_OR_DEPARTMENT_OTHER): Payer: BLUE CROSS/BLUE SHIELD

## 2016-01-13 VITALS — BP 117/74 | HR 80 | Temp 98.3°F | Resp 18

## 2016-01-13 DIAGNOSIS — C50412 Malignant neoplasm of upper-outer quadrant of left female breast: Secondary | ICD-10-CM

## 2016-01-13 DIAGNOSIS — Z5112 Encounter for antineoplastic immunotherapy: Secondary | ICD-10-CM | POA: Diagnosis not present

## 2016-01-13 DIAGNOSIS — G43009 Migraine without aura, not intractable, without status migrainosus: Secondary | ICD-10-CM

## 2016-01-13 LAB — CBC WITH DIFFERENTIAL/PLATELET
BASO%: 1 % (ref 0.0–2.0)
Basophils Absolute: 0.1 10*3/uL (ref 0.0–0.1)
EOS%: 2.5 % (ref 0.0–7.0)
Eosinophils Absolute: 0.1 10*3/uL (ref 0.0–0.5)
HEMATOCRIT: 39.5 % (ref 34.8–46.6)
HGB: 13.2 g/dL (ref 11.6–15.9)
LYMPH#: 1.2 10*3/uL (ref 0.9–3.3)
LYMPH%: 22.1 % (ref 14.0–49.7)
MCH: 32.9 pg (ref 25.1–34.0)
MCHC: 33.6 g/dL (ref 31.5–36.0)
MCV: 98.2 fL (ref 79.5–101.0)
MONO#: 0.5 10*3/uL (ref 0.1–0.9)
MONO%: 9.7 % (ref 0.0–14.0)
NEUT%: 64.7 % (ref 38.4–76.8)
NEUTROS ABS: 3.6 10*3/uL (ref 1.5–6.5)
PLATELETS: 201 10*3/uL (ref 145–400)
RBC: 4.02 10*6/uL (ref 3.70–5.45)
RDW: 12.4 % (ref 11.2–14.5)
WBC: 5.6 10*3/uL (ref 3.9–10.3)

## 2016-01-13 MED ORDER — ACETAMINOPHEN 325 MG PO TABS
ORAL_TABLET | ORAL | Status: AC
  Filled 2016-01-13: qty 2

## 2016-01-13 MED ORDER — ACETAMINOPHEN 325 MG PO TABS
650.0000 mg | ORAL_TABLET | Freq: Once | ORAL | Status: AC
  Administered 2016-01-13: 650 mg via ORAL

## 2016-01-13 MED ORDER — SODIUM CHLORIDE 0.9 % IJ SOLN
10.0000 mL | INTRAMUSCULAR | Status: DC | PRN
  Administered 2016-01-13: 10 mL
  Filled 2016-01-13: qty 10

## 2016-01-13 MED ORDER — TRASTUZUMAB CHEMO INJECTION 440 MG
6.0000 mg/kg | Freq: Once | INTRAVENOUS | Status: AC
  Administered 2016-01-13: 483 mg via INTRAVENOUS
  Filled 2016-01-13: qty 23

## 2016-01-13 MED ORDER — SODIUM CHLORIDE 0.9 % IV SOLN
Freq: Once | INTRAVENOUS | Status: AC
  Administered 2016-01-13: 11:00:00 via INTRAVENOUS

## 2016-01-13 MED ORDER — HEPARIN SOD (PORK) LOCK FLUSH 100 UNIT/ML IV SOLN
500.0000 [IU] | Freq: Once | INTRAVENOUS | Status: AC | PRN
  Administered 2016-01-13: 500 [IU]
  Filled 2016-01-13: qty 5

## 2016-01-13 NOTE — Patient Instructions (Signed)
[facility] Discharge Instructions for Patients Receiving Chemotherapy  Today you received the following chemotherapy agents: Herceptin   To help prevent nausea and vomiting after your treatment, we encourage you to take your nausea medication as directed.    If you develop nausea and vomiting that is not controlled by your nausea medication, call the clinic.   BELOW ARE SYMPTOMS THAT SHOULD BE REPORTED IMMEDIATELY:  *FEVER GREATER THAN 100.5 F  *CHILLS WITH OR WITHOUT FEVER  NAUSEA AND VOMITING THAT IS NOT CONTROLLED WITH YOUR NAUSEA MEDICATION  *UNUSUAL SHORTNESS OF BREATH  *UNUSUAL BRUISING OR BLEEDING  TENDERNESS IN MOUTH AND THROAT WITH OR WITHOUT PRESENCE OF ULCERS  *URINARY PROBLEMS  *BOWEL PROBLEMS  UNUSUAL RASH Items with * indicate a potential emergency and should be followed up as soon as possible.  Feel free to call the clinic you have any questions or concerns. The clinic phone number is [phone].  Please show the CHEMO ALERT CARD at check-in to the Emergency Department and triage nurse.   

## 2016-01-13 NOTE — Progress Notes (Signed)
Patient declined taking her Benadryl today "it makes me drowsy and I have a lot to do" and Dr. Jana Hakim aware and agreed that was ok. Patient educated on potential reactions and will notify us if she feels any symptoms during infusion.

## 2016-01-14 ENCOUNTER — Telehealth: Payer: Self-pay | Admitting: Oncology

## 2016-01-14 NOTE — Telephone Encounter (Signed)
CALLED AND LEFT A MESSAGE WITH HER ECHO APPT

## 2016-01-20 ENCOUNTER — Encounter: Payer: Self-pay | Admitting: Radiation Oncology

## 2016-01-20 ENCOUNTER — Ambulatory Visit
Admission: RE | Admit: 2016-01-20 | Discharge: 2016-01-20 | Disposition: A | Discharge status: Home / Self Care | Payer: BLUE CROSS/BLUE SHIELD | Source: Ambulatory Visit | Attending: Radiation Oncology | Admitting: Radiation Oncology

## 2016-01-20 VITALS — BP 118/63 | HR 84 | Temp 98.5°F | Ht 60.8 in | Wt 184.4 lb

## 2016-01-20 DIAGNOSIS — C50412 Malignant neoplasm of upper-outer quadrant of left female breast: Secondary | ICD-10-CM

## 2016-01-20 NOTE — Progress Notes (Addendum)
Skin status: Left breast looks good no irritation Lotion being used: Radiplex Have you seen your medical oncologist?Dr. Lurline Del 01-12-16 Will see again 03-16-16  ER+,have startTamoxifen 02-08-16   Discuss survivorship appointment: 02-02-16  Offer referral to [facility] 02-02-16 Appetite:Good Pain:None Energy level:Normal activity level for her normal day.day    Having some swelling in hands and feet no on any dieuretic BP 118/63 mmHg  Pulse 84  Temp(Src) 98.5 F (36.9 C) (Oral)  Ht 5' 0.8" (1.544 m)  Wt 184 lb 6.4 oz (83.643 kg)  BMI 35.09 kg/m2  SpO2 96%  LMP 11/20/2015  Wt Readings from Last 3 Encounters:  01/20/16 184 lb 6.4 oz (83.643 kg)  01/12/16 184 lb 8 oz (83.689 kg)  12/07/15 182 lb 9.6 oz (82.827 kg)

## 2016-01-20 NOTE — Progress Notes (Signed)
   Department of Radiation Oncology  Phone:  [phone] Fax:        [phone]   Name: Cynthia [name] MRN: [id number]  DOB: 07-20-1966  Date: 01/20/2016  Follow Up Visit Note  Diagnosis: Breast cancer of upper-outer quadrant of left female breast [facility])   Staging form: Breast, AJCC 7th Edition     Clinical stage from 04/21/2015: Stage IIA (T2, N0, M0) - Unsigned  Summary and Interval since last radiation: 6 weeks  10/27-12/13/16: Left breast/ 45 Gy at 1.8 Gy per fraction x 25 fractions.  Left breast boost/ 16 Gy at 2 Gy per fraction x 8 fractions  Interval History: Cynthia [name] presents today for routine followup. She saw Dr. Jana Hakim on 01/12/16 and will start Tamoxifen in February. She reports some fatigue. She continues on Herceptin every 3 weeks. She is remaining active and back to work. She is trying to lose weight and is distressed due to weight gain.  Physical Exam:  Filed Vitals:   01/20/16 1416  BP: 118/63  Pulse: 84  Temp: 98.5 F (36.9 C)  TempSrc: Oral  Height: 5' 0.8" (1.544 m)  Weight: 184 lb 6.4 oz (83.643 kg)  SpO2: 96%  Alert and oriented x3. Hyperpigmentation over the left breast. Skin is well healed.  IMPRESSION: Cynthia [name] is a 50 y.o. female with stage IIA triple positive left breast cancer with resolving effects of radiation treatment.  PLAN: She is doing well. We discussed the need for follow up every 4-6 months which she has scheduled.  We discussed the need for yearly mammograms which she can schedule with her OBGYN or with medical oncology. We discussed the need for sun protection in the treated area.  She can always call me with questions.  I will follow up with her on an as needed basis.  She will follow up with Dr. Jana Hakim in March and continue Herceptin and start Tamoxifen as planned.  Cynthia Silversmith, MD  This document serves as a record of services personally performed by Cynthia Silversmith, MD. It was created on her behalf by Darcus Austin, a trained  medical scribe. The creation of this record is based on the scribe's personal observations and the provider's statements to them. This document has been checked and approved by the attending provider.

## 2016-02-03 ENCOUNTER — Other Ambulatory Visit (HOSPITAL_BASED_OUTPATIENT_CLINIC_OR_DEPARTMENT_OTHER): Payer: BLUE CROSS/BLUE SHIELD

## 2016-02-03 ENCOUNTER — Ambulatory Visit (HOSPITAL_BASED_OUTPATIENT_CLINIC_OR_DEPARTMENT_OTHER): Payer: BLUE CROSS/BLUE SHIELD

## 2016-02-03 VITALS — BP 115/63 | HR 79 | Temp 98.4°F | Resp 18

## 2016-02-03 DIAGNOSIS — Z5112 Encounter for antineoplastic immunotherapy: Secondary | ICD-10-CM | POA: Diagnosis not present

## 2016-02-03 DIAGNOSIS — G43009 Migraine without aura, not intractable, without status migrainosus: Secondary | ICD-10-CM

## 2016-02-03 DIAGNOSIS — C50412 Malignant neoplasm of upper-outer quadrant of left female breast: Secondary | ICD-10-CM | POA: Diagnosis not present

## 2016-02-03 LAB — CBC WITH DIFFERENTIAL/PLATELET
BASO%: 0.2 % (ref 0.0–2.0)
BASOS ABS: 0 10*3/uL (ref 0.0–0.1)
EOS%: 1.9 % (ref 0.0–7.0)
Eosinophils Absolute: 0.1 10*3/uL (ref 0.0–0.5)
HCT: 39 % (ref 34.8–46.6)
HEMOGLOBIN: 13.4 g/dL (ref 11.6–15.9)
LYMPH#: 1.3 10*3/uL (ref 0.9–3.3)
LYMPH%: 20.7 % (ref 14.0–49.7)
MCH: 33.1 pg (ref 25.1–34.0)
MCHC: 34.4 g/dL (ref 31.5–36.0)
MCV: 96.3 fL (ref 79.5–101.0)
MONO#: 0.4 10*3/uL (ref 0.1–0.9)
MONO%: 6.5 % (ref 0.0–14.0)
NEUT#: 4.6 10*3/uL (ref 1.5–6.5)
NEUT%: 70.7 % (ref 38.4–76.8)
NRBC: 0 % (ref 0–0)
Platelets: 235 10*3/uL (ref 145–400)
RBC: 4.05 10*6/uL (ref 3.70–5.45)
RDW: 11.9 % (ref 11.2–14.5)
WBC: 6.4 10*3/uL (ref 3.9–10.3)

## 2016-02-03 MED ORDER — HEPARIN SOD (PORK) LOCK FLUSH 100 UNIT/ML IV SOLN
500.0000 [IU] | Freq: Once | INTRAVENOUS | Status: AC | PRN
  Administered 2016-02-03: 500 [IU]
  Filled 2016-02-03: qty 5

## 2016-02-03 MED ORDER — SODIUM CHLORIDE 0.9 % IV SOLN
6.0000 mg/kg | Freq: Once | INTRAVENOUS | Status: AC
  Administered 2016-02-03: 483 mg via INTRAVENOUS
  Filled 2016-02-03: qty 23

## 2016-02-03 MED ORDER — ACETAMINOPHEN 325 MG PO TABS
650.0000 mg | ORAL_TABLET | Freq: Once | ORAL | Status: AC
  Administered 2016-02-03: 650 mg via ORAL

## 2016-02-03 MED ORDER — DIPHENHYDRAMINE HCL 25 MG PO CAPS: 25.0000 mg | ORAL_CAPSULE | Freq: Once | ORAL | Status: DC

## 2016-02-03 MED ORDER — SODIUM CHLORIDE 0.9 % IV SOLN
Freq: Once | INTRAVENOUS | Status: AC
  Administered 2016-02-03: 11:00:00 via INTRAVENOUS

## 2016-02-03 MED ORDER — ACETAMINOPHEN 325 MG PO TABS
ORAL_TABLET | ORAL | Status: AC
  Filled 2016-02-03: qty 2

## 2016-02-03 MED ORDER — SODIUM CHLORIDE 0.9 % IJ SOLN
10.0000 mL | INTRAMUSCULAR | Status: DC | PRN
  Administered 2016-02-03: 10 mL
  Filled 2016-02-03: qty 10

## 2016-02-03 NOTE — Patient Instructions (Signed)
[facility] Discharge Instructions for Patients Receiving Chemotherapy  Today you received the following chemotherapy agents:  Herceptin  To help prevent nausea and vomiting after your treatment, we encourage you to take your nausea medication as prescribed.   If you develop nausea and vomiting that is not controlled by your nausea medication, call the clinic.   BELOW ARE SYMPTOMS THAT SHOULD BE REPORTED IMMEDIATELY:  *FEVER GREATER THAN 100.5 F  *CHILLS WITH OR WITHOUT FEVER  NAUSEA AND VOMITING THAT IS NOT CONTROLLED WITH YOUR NAUSEA MEDICATION  *UNUSUAL SHORTNESS OF BREATH  *UNUSUAL BRUISING OR BLEEDING  TENDERNESS IN MOUTH AND THROAT WITH OR WITHOUT PRESENCE OF ULCERS  *URINARY PROBLEMS  *BOWEL PROBLEMS  UNUSUAL RASH Items with * indicate a potential emergency and should be followed up as soon as possible.  Feel free to call the clinic you have any questions or concerns. The clinic phone number is [phone].  Please show the CHEMO ALERT CARD at check-in to the Emergency Department and triage nurse.   

## 2016-02-22 ENCOUNTER — Telehealth: Payer: Self-pay | Admitting: Oncology

## 2016-02-22 NOTE — Telephone Encounter (Signed)
Returned patient call re changing time for 3/2 lab/inf. Gave patient new time for 3/2 lab/inj for 12 pm.

## 2016-02-24 ENCOUNTER — Telehealth: Payer: Self-pay | Admitting: Nurse Practitioner

## 2016-02-24 ENCOUNTER — Ambulatory Visit (HOSPITAL_BASED_OUTPATIENT_CLINIC_OR_DEPARTMENT_OTHER): Payer: BLUE CROSS/BLUE SHIELD

## 2016-02-24 ENCOUNTER — Encounter: Payer: Self-pay | Admitting: Nurse Practitioner

## 2016-02-24 ENCOUNTER — Ambulatory Visit (HOSPITAL_BASED_OUTPATIENT_CLINIC_OR_DEPARTMENT_OTHER): Payer: BLUE CROSS/BLUE SHIELD | Admitting: Nurse Practitioner

## 2016-02-24 ENCOUNTER — Other Ambulatory Visit (HOSPITAL_BASED_OUTPATIENT_CLINIC_OR_DEPARTMENT_OTHER): Payer: BLUE CROSS/BLUE SHIELD

## 2016-02-24 VITALS — BP 121/48 | HR 86 | Temp 98.6°F | Resp 18

## 2016-02-24 DIAGNOSIS — J40 Bronchitis, not specified as acute or chronic: Secondary | ICD-10-CM

## 2016-02-24 DIAGNOSIS — Z5112 Encounter for antineoplastic immunotherapy: Secondary | ICD-10-CM

## 2016-02-24 DIAGNOSIS — C50412 Malignant neoplasm of upper-outer quadrant of left female breast: Secondary | ICD-10-CM

## 2016-02-24 DIAGNOSIS — G43009 Migraine without aura, not intractable, without status migrainosus: Secondary | ICD-10-CM

## 2016-02-24 LAB — INFLUENZA A AND B
INFLUENZA A AG, EIA: NEGATIVE
Influenza B Ag, EIA: NEGATIVE

## 2016-02-24 LAB — CBC WITH DIFFERENTIAL/PLATELET
BASO%: 0.2 % (ref 0.0–2.0)
BASOS ABS: 0 10*3/uL (ref 0.0–0.1)
EOS ABS: 0.1 10*3/uL (ref 0.0–0.5)
EOS%: 2.3 % (ref 0.0–7.0)
HCT: 40.3 % (ref 34.8–46.6)
HGB: 13.5 g/dL (ref 11.6–15.9)
LYMPH%: 19.3 % (ref 14.0–49.7)
MCH: 32.8 pg (ref 25.1–34.0)
MCHC: 33.5 g/dL (ref 31.5–36.0)
MCV: 98.1 fL (ref 79.5–101.0)
MONO#: 0.4 10*3/uL (ref 0.1–0.9)
MONO%: 9 % (ref 0.0–14.0)
NEUT#: 3 10*3/uL (ref 1.5–6.5)
NEUT%: 69.2 % (ref 38.4–76.8)
PLATELETS: 179 10*3/uL (ref 145–400)
RBC: 4.11 10*6/uL (ref 3.70–5.45)
RDW: 12.4 % (ref 11.2–14.5)
WBC: 4.3 10*3/uL (ref 3.9–10.3)
lymph#: 0.8 10*3/uL — ABNORMAL LOW (ref 0.9–3.3)

## 2016-02-24 MED ORDER — HEPARIN SOD (PORK) LOCK FLUSH 100 UNIT/ML IV SOLN
500.0000 [IU] | Freq: Once | INTRAVENOUS | Status: AC | PRN
  Filled 2016-02-24: qty 5

## 2016-02-24 MED ORDER — TRASTUZUMAB CHEMO INJECTION 440 MG
6.0000 mg/kg | Freq: Once | INTRAVENOUS | Status: AC
  Administered 2016-02-24: 483 mg via INTRAVENOUS
  Filled 2016-02-24: qty 23

## 2016-02-24 MED ORDER — SODIUM CHLORIDE 0.9 % IV SOLN
Freq: Once | INTRAVENOUS | Status: AC
  Administered 2016-02-24: 13:00:00 via INTRAVENOUS

## 2016-02-24 MED ORDER — DIPHENHYDRAMINE HCL 25 MG PO CAPS: 25.0000 mg | ORAL_CAPSULE | Freq: Once | ORAL | Status: DC

## 2016-02-24 MED ORDER — ALBUTEROL SULFATE HFA 108 (90 BASE) MCG/ACT IN AERS: 1.0000 | INHALATION_SPRAY | Freq: Four times a day (QID) | RESPIRATORY_TRACT | Status: DC | PRN

## 2016-02-24 MED ORDER — SODIUM CHLORIDE 0.9 % IJ SOLN
10.0000 mL | INTRAMUSCULAR | Status: DC | PRN
  Administered 2016-02-24: 10 mL
  Filled 2016-02-24: qty 10

## 2016-02-24 MED ORDER — ACETAMINOPHEN 325 MG PO TABS
ORAL_TABLET | ORAL | Status: AC
  Filled 2016-02-24: qty 2

## 2016-02-24 MED ORDER — ACETAMINOPHEN 325 MG PO TABS
650.0000 mg | ORAL_TABLET | Freq: Once | ORAL | Status: AC
  Administered 2016-02-24: 650 mg via ORAL

## 2016-02-24 MED ORDER — AZITHROMYCIN 250 MG PO TABS: ORAL_TABLET | ORAL | Status: DC

## 2016-02-24 NOTE — Progress Notes (Signed)
SYMPTOM MANAGEMENT CLINIC   HPI: Cynthia [name] 50 y.o. female diagnosed with breast cancer.  Patient is status post neoadjuvant chemotherapy, completed in August 2016, lumpectomy completed in September 2016, and radiation treatments that were completed in December 2016.  Patient is currently undergoing Herceptin on an every three-week basis and initiated tamoxifen oral therapy on 02/08/2016.   Patient reports fairly sudden onset of URI symptoms within the past 24 hours or so.  She complains of nasal congestion, mild sinus drainage, a tickle in her throat, and some chest tightness when she coughs.  She denies any active chest pain, chest pressure, shortness of breath, or pain with inspiration.  She also has heard some wheezing as well.  She is unsure.  She's had any fevers; but admits to chills.  Patient states she has been around several coworkers who have the same symptoms recently.  Exam today reveals patient with nasal congestion and some sinus drainage.  Breath sounds are clear bilaterally; with no obvious shortness of breath or wheeze noted.  Vital signs are stable and patient was afebrile today.  O2 sat was 99% on room air.  Flu swab obtained today was negative.  Advised patient would treat her for mild bronchitis symptoms with Zithromax and an albuterol inhaler.  Patient was advised to call/return to go directly to the emergency department for any worsening symptoms whatsoever.  HPI  Review of Systems  Constitutional: Positive for malaise/fatigue.  HENT: Positive for congestion and sore throat. Negative for ear pain.   Respiratory: Positive for cough, sputum production and wheezing. Negative for hemoptysis and shortness of breath.   Cardiovascular: Negative for chest pain and orthopnea.  Musculoskeletal: Positive for joint pain.  Neurological: Negative for headaches.  All other systems reviewed and are negative.   Past Medical History  Diagnosis Date  . Breast cancer of  upper-outer quadrant of left female breast ([place]) 04/16/2015  . Anxiety   . Hot flashes   . Breast cancer ([place]) 03/2015    IDC of UOQ of left breast; ER/PR+, Her2+  . Skin cancer     Hx of melanoma at 33y; multiple basal cell carcinomas first dx in late 39s    Past Surgical History  Procedure Laterality Date  . Bladder suspension  2009  . Dnc    . Dilation and curettage of uterus    . Portacath placement Right 04/27/2015    Procedure: INSERTION PORT-A-CATH;  Surgeon: Rolm Bookbinder, MD;  Location: [facility];  Service: General;  Laterality: Right;  . Radioactive seed guided mastectomy with axillary sentinel lymph node biopsy Left 09/21/2015    Procedure: RADIOACTIVE SEED GUIDED LUMPECTOMY WITH AXILLARY SENTINEL LYMPH NODE BIOPSY;  Surgeon: Rolm Bookbinder, MD;  Location: [facility];  Service: General;  Laterality: Left;    has Breast cancer of upper-outer quadrant of left female breast ([place]); Migraines; Rash; Dehydration; Esophagitis; Drug-induced skin rash; Hot flashes; Excessive tearing; History of skin cancer; Genetic testing; Hearing loss associated with syndrome of both ears; and Bronchitis on her problem list.    is allergic to zofran and adhesive.    Medication List       This list is accurate as of: 02/24/16  4:17 PM.  Always use your most recent med list.               albuterol 108 (90 Base) MCG/ACT inhaler  Commonly known as:  PROVENTIL HFA;VENTOLIN HFA  Inhale 1-2 puffs into the lungs every 6 (six) hours  as needed for wheezing or shortness of breath.     ALPRAZolam 1 MG tablet  Commonly known as:  XANAX     azithromycin 250 MG tablet  Commonly known as:  ZITHROMAX Z-PAK  z pak: take as directed.     CENTRUM SILVER PO  Take by mouth. Reported on 01/20/2016     gabapentin 300 MG capsule  Commonly known as:  NEURONTIN  Take 1 capsule (300 mg total) by mouth at bedtime.     loratadine 10 MG tablet  Commonly known as:  CLARITIN    Take 1 tablet (10 mg total) by mouth daily.     venlafaxine XR 75 MG 24 hr capsule  Commonly known as:  EFFEXOR-XR  Take 1 capsule (75 mg total) by mouth daily with breakfast.         PHYSICAL EXAMINATION  Oncology Vitals 02/24/2016 02/03/2016  Height - -  Weight - -  Weight (lbs) - -  BMI (kg/m2) - -  Temp 98.6 98.4  Pulse 86 79  Resp 18 18  SpO2 99 100  BSA (m2) - -   BP Readings from Last 2 Encounters:  02/24/16 121/48  02/03/16 115/63    Physical Exam  Constitutional: She is oriented to person, place, and time and well-developed, well-nourished, and in no distress.  HENT:  Head: Normocephalic and atraumatic.  Mouth/Throat: Oropharynx is clear and moist.  Nasal congestion.  Eyes: Conjunctivae and EOM are normal. Pupils are equal, round, and reactive to light. Right eye exhibits no discharge. Left eye exhibits no discharge. No scleral icterus.  Neck: Normal range of motion. Neck supple. No JVD present. No tracheal deviation present. No thyromegaly present.  Cardiovascular: Normal rate, regular rhythm, normal heart sounds and intact distal pulses.   Pulmonary/Chest: Effort normal and breath sounds normal. No respiratory distress. She has no wheezes. She has no rales. She exhibits no tenderness.  Abdominal: Soft. Bowel sounds are normal. She exhibits no distension and no mass. There is no tenderness. There is no rebound and no guarding.  Musculoskeletal: Normal range of motion. She exhibits no edema or tenderness.  Lymphadenopathy:    She has no cervical adenopathy.  Neurological: She is alert and oriented to person, place, and time. Gait normal.  Skin: Skin is warm and dry. No rash noted. No erythema. No pallor.  Psychiatric: Affect normal.    LABORATORY DATA:. Appointment on 02/24/2016  Component Date Value Ref Range Status  . WBC 02/24/2016 4.3  3.9 - 10.3 10e3/uL Final  . NEUT# 02/24/2016 3.0  1.5 - 6.5 10e3/uL Final  . HGB 02/24/2016 13.5  11.6 - 15.9 g/dL Final   . HCT 02/24/2016 40.3  34.8 - 46.6 % Final  . Platelets 02/24/2016 179  145 - 400 10e3/uL Final  . MCV 02/24/2016 98.1  79.5 - 101.0 fL Final  . MCH 02/24/2016 32.8  25.1 - 34.0 pg Final  . MCHC 02/24/2016 33.5  31.5 - 36.0 g/dL Final  . RBC 02/24/2016 4.11  3.70 - 5.45 10e6/uL Final  . RDW 02/24/2016 12.4  11.2 - 14.5 % Final  . lymph# 02/24/2016 0.8* 0.9 - 3.3 10e3/uL Final  . MONO# 02/24/2016 0.4  0.1 - 0.9 10e3/uL Final  . Eosinophils Absolute 02/24/2016 0.1  0.0 - 0.5 10e3/uL Final  . Basophils Absolute 02/24/2016 0.0  0.0 - 0.1 10e3/uL Final  . NEUT% 02/24/2016 69.2  38.4 - 76.8 % Final  . LYMPH% 02/24/2016 19.3  14.0 - 49.7 % Final  .  MONO% 02/24/2016 9.0  0.0 - 14.0 % Final  . EOS% 02/24/2016 2.3  0.0 - 7.0 % Final  . BASO% 02/24/2016 0.2  0.0 - 2.0 % Final  . Influenza A Ag, EIA 02/24/2016 Negative  Negative Final  . Influenza B Ag, EIA 02/24/2016 Negative  Negative Final  . Influenza Comment 02/24/2016 See note   Final  . Please note: 02/24/2016 Comment   Final   Comment: Because a negative rapid influenza antigen EIA test may not rule-out influenza viral infection, the CDC and other public health agencies have recommended that confirmatory testing using viral culture or nucleic acid amplification should be considered when clinical signs and symptoms are consistent with influenza-like illness, as well as to assist in detecting other respiratory viruses that can produce similar clinical symptoms. (Reference: https://lee-mcguire.com/ guidance_ridt.pdf)      RADIOGRAPHIC STUDIES: No results found.  ASSESSMENT/PLAN:    Bronchitis Patient reports fairly sudden onset of URI symptoms within the past 24 hours or so.  She complains of nasal congestion, mild sinus drainage, a tickle in her throat, and some chest tightness when she coughs.  She denies any active chest pain, chest pressure, shortness of breath, or pain with inspiration.  She also  has heard some wheezing as well.  She is unsure.  She's had any fevers; but admits to chills.  Patient states she has been around several coworkers who have the same symptoms recently.  Exam today reveals patient with nasal congestion and some sinus drainage.  Breath sounds are clear bilaterally; with no obvious shortness of breath or wheeze noted.  Vital signs are stable and patient was afebrile today.  O2 sat was 99% on room air.  Flu swab obtained today was negative.  Advised patient would treat her for mild bronchitis symptoms with Zithromax and an albuterol inhaler.  Patient was advised to call/return to go directly to the emergency department for any worsening symptoms whatsoever.  Breast cancer of upper-outer quadrant of left female breast Patient presented to the [facility] today for her Herceptin infusion.  She has developed some URI symptoms within the past 24-48 hours.  See further notes for details.  Blood counts obtained today were essentially normal.  Vital signs were stable and patient was afebrile.  Patient will proceed today with her Herceptin infusion.  She is scheduled to complete the year's worth of Herceptin in mid May 2017.  Also, patient initiated tamoxifen oral therapy on 02/08/2016.She reports some achiness and her hands and her feet.  She denies any recent hot flashes.  .  Patient is scheduled for a repeat echo on 03/15/2016.  She is scheduled for her next visit on 03/16/2016.        Patient stated understanding of all instructions; and was in agreement with this plan of care. The patient knows to call the clinic with any problems, questions or concerns.   Review/collaboration with Dr. Jana Hakim regarding all aspects of patient's visit today.   Total time spent with patient was 25 minutes;  with greater than 75 percent of that time spent in face to face counseling regarding patient's symptoms,  and coordination of care and follow up.  Disclaimer:This  dictation was prepared with Dragon/digital dictation along with [employer]. Any transcriptional errors that result from this process are unintentional.  Drue Second, NP 02/24/2016

## 2016-02-24 NOTE — Assessment & Plan Note (Signed)
Patient presented to the [facility] today for her Herceptin infusion.  She has developed some URI symptoms within the past 24-48 hours.  See further notes for details.  Blood counts obtained today were essentially normal.  Vital signs were stable and patient was afebrile.  Patient will proceed today with her Herceptin infusion.  She is scheduled to complete the year's worth of Herceptin in mid May 2017.  Also, patient initiated tamoxifen oral therapy on 02/08/2016.She reports some achiness and her hands and her feet.  She denies any recent hot flashes.  .  Patient is scheduled for a repeat echo on 03/15/2016.  She is scheduled for her next visit on 03/16/2016.

## 2016-02-24 NOTE — Patient Instructions (Signed)
[facility] Discharge Instructions for Patients Receiving Chemotherapy  Today you received the following chemotherapy agents:  Herceptin  To help prevent nausea and vomiting after your treatment, we encourage you to take your nausea medication as prescribed.   If you develop nausea and vomiting that is not controlled by your nausea medication, call the clinic.   BELOW ARE SYMPTOMS THAT SHOULD BE REPORTED IMMEDIATELY:  *FEVER GREATER THAN 100.5 F  *CHILLS WITH OR WITHOUT FEVER  NAUSEA AND VOMITING THAT IS NOT CONTROLLED WITH YOUR NAUSEA MEDICATION  *UNUSUAL SHORTNESS OF BREATH  *UNUSUAL BRUISING OR BLEEDING  TENDERNESS IN MOUTH AND THROAT WITH OR WITHOUT PRESENCE OF ULCERS  *URINARY PROBLEMS  *BOWEL PROBLEMS  UNUSUAL RASH Items with * indicate a potential emergency and should be followed up as soon as possible.  Feel free to call the clinic you have any questions or concerns. The clinic phone number is [phone].  Please show the CHEMO ALERT CARD at check-in to the Emergency Department and triage nurse.   

## 2016-02-24 NOTE — Assessment & Plan Note (Signed)
Patient reports fairly sudden onset of URI symptoms within the past 24 hours or so.  She complains of nasal congestion, mild sinus drainage, a tickle in her throat, and some chest tightness when she coughs.  She denies any active chest pain, chest pressure, shortness of breath, or pain with inspiration.  She also has heard some wheezing as well.  She is unsure.  She's had any fevers; but admits to chills.  Patient states she has been around several coworkers who have the same symptoms recently.  Exam today reveals patient with nasal congestion and some sinus drainage.  Breath sounds are clear bilaterally; with no obvious shortness of breath or wheeze noted.  Vital signs are stable and patient was afebrile today.  O2 sat was 99% on room air.  Flu swab obtained today was negative.  Advised patient would treat her for mild bronchitis symptoms with Zithromax and an albuterol inhaler.  Patient was advised to call/return to go directly to the emergency department for any worsening symptoms whatsoever.

## 2016-02-24 NOTE — Telephone Encounter (Signed)
per pof to sch pt appt on [facility] per [facility] adv earliest is 3:30 opening

## 2016-02-24 NOTE — Progress Notes (Signed)
Patient complains of chest tightness when she inhales deeply. Patient states she noticed the chest tightness this am. Patient denies nausea or vomiting or radiating pain into her arms or jaw. Patient states she started coughing last night and had a very little productive cough. Patient states she did not look at the color mucus that was expelled with the cough. Selena Lesser, NP notified. Ok to proceed with herceptin per Selena Lesser, NP.

## 2016-02-25 ENCOUNTER — Telehealth: Payer: Self-pay | Admitting: Oncology

## 2016-02-25 ENCOUNTER — Other Ambulatory Visit: Payer: Self-pay | Admitting: *Deleted

## 2016-02-25 NOTE — Telephone Encounter (Signed)
Added appt pt will get sched at nxt visit

## 2016-03-09 ENCOUNTER — Other Ambulatory Visit: Payer: Self-pay | Admitting: Nurse Practitioner

## 2016-03-15 ENCOUNTER — Ambulatory Visit (HOSPITAL_COMMUNITY)
Admission: RE | Admit: 2016-03-15 | Discharge: 2016-03-15 | Disposition: A | Discharge status: Home / Self Care | Payer: BLUE CROSS/BLUE SHIELD | Source: Ambulatory Visit | Attending: Oncology | Admitting: Oncology

## 2016-03-15 ENCOUNTER — Other Ambulatory Visit: Payer: Self-pay

## 2016-03-15 DIAGNOSIS — C50412 Malignant neoplasm of upper-outer quadrant of left female breast: Secondary | ICD-10-CM

## 2016-03-15 NOTE — Progress Notes (Signed)
*  PRELIMINARY RESULTS* Echocardiogram 2D Echocardiogram has been performed.  Cynthia [name] 03/15/2016, 11:05 AM

## 2016-03-16 ENCOUNTER — Encounter: Payer: Self-pay | Admitting: Nurse Practitioner

## 2016-03-16 ENCOUNTER — Ambulatory Visit (HOSPITAL_BASED_OUTPATIENT_CLINIC_OR_DEPARTMENT_OTHER): Payer: BLUE CROSS/BLUE SHIELD | Admitting: Nurse Practitioner

## 2016-03-16 ENCOUNTER — Other Ambulatory Visit (HOSPITAL_BASED_OUTPATIENT_CLINIC_OR_DEPARTMENT_OTHER): Payer: BLUE CROSS/BLUE SHIELD

## 2016-03-16 ENCOUNTER — Other Ambulatory Visit: Payer: Self-pay | Admitting: Oncology

## 2016-03-16 ENCOUNTER — Ambulatory Visit (HOSPITAL_BASED_OUTPATIENT_CLINIC_OR_DEPARTMENT_OTHER): Payer: BLUE CROSS/BLUE SHIELD

## 2016-03-16 ENCOUNTER — Telehealth: Payer: Self-pay | Admitting: Nurse Practitioner

## 2016-03-16 VITALS — BP 127/74 | HR 81 | Temp 98.4°F | Resp 18 | Wt 181.7 lb

## 2016-03-16 DIAGNOSIS — Z5112 Encounter for antineoplastic immunotherapy: Secondary | ICD-10-CM | POA: Diagnosis not present

## 2016-03-16 DIAGNOSIS — C50412 Malignant neoplasm of upper-outer quadrant of left female breast: Secondary | ICD-10-CM

## 2016-03-16 DIAGNOSIS — F329 Major depressive disorder, single episode, unspecified: Secondary | ICD-10-CM

## 2016-03-16 DIAGNOSIS — N951 Menopausal and female climacteric states: Secondary | ICD-10-CM | POA: Diagnosis not present

## 2016-03-16 DIAGNOSIS — R232 Flushing: Secondary | ICD-10-CM

## 2016-03-16 DIAGNOSIS — F32A Depression, unspecified: Secondary | ICD-10-CM

## 2016-03-16 DIAGNOSIS — G43009 Migraine without aura, not intractable, without status migrainosus: Secondary | ICD-10-CM

## 2016-03-16 LAB — CBC WITH DIFFERENTIAL/PLATELET
BASO%: 0.4 % (ref 0.0–2.0)
BASOS ABS: 0 10*3/uL (ref 0.0–0.1)
EOS ABS: 0.2 10*3/uL (ref 0.0–0.5)
EOS%: 3.1 % (ref 0.0–7.0)
HCT: 38 % (ref 34.8–46.6)
HGB: 12.8 g/dL (ref 11.6–15.9)
LYMPH%: 32.3 % (ref 14.0–49.7)
MCH: 33.3 pg (ref 25.1–34.0)
MCHC: 33.7 g/dL (ref 31.5–36.0)
MCV: 99 fL (ref 79.5–101.0)
MONO#: 0.3 10*3/uL (ref 0.1–0.9)
MONO%: 6.5 % (ref 0.0–14.0)
NEUT#: 2.8 10*3/uL (ref 1.5–6.5)
NEUT%: 57.7 % (ref 38.4–76.8)
Platelets: 166 10*3/uL (ref 145–400)
RBC: 3.84 10*6/uL (ref 3.70–5.45)
RDW: 12.4 % (ref 11.2–14.5)
WBC: 4.8 10*3/uL (ref 3.9–10.3)
lymph#: 1.6 10*3/uL (ref 0.9–3.3)

## 2016-03-16 LAB — COMPREHENSIVE METABOLIC PANEL
ALBUMIN: 3.6 g/dL (ref 3.5–5.0)
ALK PHOS: 77 U/L (ref 40–150)
ALT: 17 U/L (ref 0–55)
ANION GAP: 5 meq/L (ref 3–11)
AST: 18 U/L (ref 5–34)
BILIRUBIN TOTAL: 0.39 mg/dL (ref 0.20–1.20)
BUN: 15 mg/dL (ref 7.0–26.0)
CO2: 29 mEq/L (ref 22–29)
CREATININE: 0.9 mg/dL (ref 0.6–1.1)
Calcium: 8.8 mg/dL (ref 8.4–10.4)
Chloride: 106 mEq/L (ref 98–109)
EGFR: 77 mL/min/{1.73_m2} — AB (ref >90)
GLUCOSE: 94 mg/dL (ref 70–140)
Potassium: 4 mEq/L (ref 3.5–5.1)
SODIUM: 140 meq/L (ref 136–145)
TOTAL PROTEIN: 6.6 g/dL (ref 6.4–8.3)

## 2016-03-16 MED ORDER — DIPHENHYDRAMINE HCL 25 MG PO CAPS: 25.0000 mg | ORAL_CAPSULE | Freq: Once | ORAL | Status: DC

## 2016-03-16 MED ORDER — SODIUM CHLORIDE 0.9 % IJ SOLN
10.0000 mL | INTRAMUSCULAR | Status: DC | PRN
  Administered 2016-03-16: 10 mL
  Filled 2016-03-16: qty 10

## 2016-03-16 MED ORDER — TRASTUZUMAB CHEMO INJECTION 440 MG
6.0000 mg/kg | Freq: Once | INTRAVENOUS | Status: AC
  Administered 2016-03-16: 483 mg via INTRAVENOUS
  Filled 2016-03-16: qty 23

## 2016-03-16 MED ORDER — VALACYCLOVIR HCL 500 MG PO TABS: 500.0000 mg | ORAL_TABLET | Freq: Every day | ORAL | Status: DC

## 2016-03-16 MED ORDER — HEPARIN SOD (PORK) LOCK FLUSH 100 UNIT/ML IV SOLN
500.0000 [IU] | Freq: Once | INTRAVENOUS | Status: AC | PRN
  Administered 2016-03-16: 500 [IU]
  Filled 2016-03-16: qty 5

## 2016-03-16 MED ORDER — ACETAMINOPHEN 325 MG PO TABS
ORAL_TABLET | ORAL | Status: AC
  Filled 2016-03-16: qty 2

## 2016-03-16 MED ORDER — SODIUM CHLORIDE 0.9 % IV SOLN
Freq: Once | INTRAVENOUS | Status: AC
  Administered 2016-03-16: 10:00:00 via INTRAVENOUS

## 2016-03-16 MED ORDER — ACETAMINOPHEN 325 MG PO TABS
650.0000 mg | ORAL_TABLET | Freq: Once | ORAL | Status: AC
  Administered 2016-03-16: 650 mg via ORAL

## 2016-03-16 MED ORDER — DIPHENHYDRAMINE HCL 25 MG PO CAPS
ORAL_CAPSULE | ORAL | Status: AC
  Filled 2016-03-16: qty 1

## 2016-03-16 NOTE — Progress Notes (Signed)
[facility]  [phone] [phone]     ID: [name] DOB: 02-11-1966  MR#: [id number]  VPX#:[id number]  Patient Care Team: No Pcp Per Patient as PCP - General (General Practice) Rolm Bookbinder, MD as Consulting Physician (General Surgery) Chauncey Cruel, MD as Consulting Physician (Oncology) Kyung Rudd, MD as Consulting Physician (Radiation Oncology) Mauro Kaufmann, RN as Registered Nurse Rockwell Germany, RN as Registered Nurse PCP: No PCP Per Patient OTHER MD: Christene Slates M.D.  CHIEF COMPLAINT: Triple positive breast cancer  CURRENT TREATMENT: trastuzumab, tamoxifen  BREAST CANCER HISTORY: From the original intake note:  Cynthia [name] had noted a change in her left breast on self-exam the last week in March. She brought it to Dr. Trellis Paganini attention and on 04/13/2015 underwent diagnostic bilateral mammography and left ultrasonography at [facility]. This found the breast composition to be category D. An area of architectural distortion was noted in the left breast upper outer quadrant correlating to the palpable mass. By ultrasound there was a 2 cm lobulated mass in the left breast upper outer quadrant which was hypoechoic. There was a 4 mm nodule immediately adjacent to this felt to be a satellite lesion.  Biopsy of the left breast mass in question 04/14/2015 showed ([id number]) invasive ductal carcinoma, grade 2, estrogen receptor 90% positive, progesterone receptor 90% positive, with an MIB-1 of 20%, and HER-2 amplified, the signals ratio being 5.36.  The patient's subsequent history is as detailed below  INTERVAL HISTORY: Cynthia [name] returns today for follow up of her triple positive, early stage breast cancer. She is due for her next dose of trastuzumab today. Since her last visit she has also started on tamoxifen. She has minimal vaginal wetness. She has hot flashes but notices them most when angry, anxious, or while drinking alcohol. She is on 346m  gabapentin at night and 768mvenlafaxine daily.  REVIEW OF SYSTEMS: LiJamesettaas few physical complaints. She feels very anxious and easily frustrated. She stays busy and this keeps her mind occupied, but sometimes when she is "still" she becomes tearful and depressed. She recognizes this as post traumatic stress and is a heavy participant in the Finding Your New Normal class. She feels incredibly supported by this group and has made friends with a younger participant who relates well to her. She uses xanax 30m32mRN. A detailed review of systems is otherwise stable.  PAST MEDICAL HISTORY: Past Medical History  Diagnosis Date  . Breast cancer of upper-outer quadrant of left female breast ([facility]/2016  . Anxiety   . Hot flashes   . Breast cancer ([facility]/2016    IDC of UOQ of left breast; ER/PR+, Her2+  . Skin cancer     Hx of melanoma at [age]yultiple basal cell carcinomas first dx in late 30s44[place]elanoma[place]: Past Surgical History  Procedure Laterality Date  . Bladder suspension  2009  . Dnc    . Dilation and curettage of uterus    . Portacath placement Right 04/27/2015    Procedure: INSERTION PORT-A-CATH;  Surgeon: MatRolm BookbinderD;  [facility];  Laterality: Right;  . Radioactive seed guided mastectomy with axillary sentinel lymph node biopsy Left 09/21/2015    Procedure: RADIOACTIVE SEED GUIDED LUMPECTOMY WITH AXILLARY SENTINEL LYMPH NODE BIOPSY;  Surgeon: MatRolm BookbinderD;  [facility];  Laterality: Left;    FAMILY HISTORY Family  History  Problem Relation Age of Onset  . Breast cancer Maternal Aunt     dx. 25s  . Lung cancer Maternal Grandmother     dx. late 50s-early 60s; "metastasis to breast"; smoker  . Lung cancer Paternal Grandfather     dx. 71s; smokeless tobacco user  . Other Mother     hx of TAH  . Colon polyps Father      unspecified amt  . Melanoma Maternal Uncle 64  . Heart Problems Paternal Grandmother   . Diabetes Paternal Grandmother   . Melanoma Other 52   the patient's parents are still living as of April 2016. They are divorced. The patient is an only child. The patient's paternal grandfather and maternal grandmother had a history of lung cancer. On the mother's side there is a history of melanoma. The patient's mother's only sister was diagnosed with breast cancer in her 60s. There is no history of ovarian cancer in the family.   GYNECOLOGIC HISTORY:  No LMP recorded.  menarche age 39, first live birth age 32, the patient is GX P3. Her periods are ongoing but irregular. She has a Mirena in place for contraception. She took oral contraceptives for approximately 15 years in the past with no complications.   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 She is single, and lives at home with her father, who is disabled and has a history of depression. At home also is the patient's daughter [name], 64 years old as of April 2016. The patient's 2 other daughters are [name], lives in [place] and is a physical therapy technician, and [name], lives in [place] and works in Press photographer.     ADVANCED DIRECTIVES:  not in place.    HEALTH MAINTENANCE: Social History  Substance Use Topics  . Smoking status: Never Smoker   . Smokeless tobacco: Never Used  . Alcohol Use: Yes     Comment: occasion drink; only smoked cigs socially while in college     Colonoscopy: never   PAP:  Bone density: never   Lipid panel:  Allergies  Allergen Reactions  . Zofran [Ondansetron Hcl] Other (See Comments)    headache  . Adhesive [Tape] Rash    Steri strip    Current Outpatient Prescriptions  Medication Sig Dispense Refill  . [facility] MELATONIN 3 MG TABS Take 1 tablet by mouth at bedtime as needed.  5  . gabapentin (NEURONTIN) 300 MG capsule Take 1 capsule (300 mg total)  by mouth at bedtime. 90 capsule 4  . loratadine (CLARITIN) 10 MG tablet Take 1 tablet (10 mg total) by mouth daily. 90 tablet 2  . MAGNESIUM PO Take 1 tablet by mouth daily.    . Multiple Vitamins-Minerals (CENTRUM SILVER PO) Take by mouth. Reported on 01/20/2016    . tamoxifen (NOLVADEX) 20 MG tablet Take 20 mg by mouth daily.    Marland Kitchen venlafaxine XR (EFFEXOR-XR) 75 MG 24 hr capsule Take 1 capsule (75 mg total) by mouth daily with breakfast. 90 capsule 1  . albuterol (PROVENTIL HFA;VENTOLIN HFA) 108 (90 Base) MCG/ACT inhaler Inhale 1-2 puffs into the lungs every 6 (six) hours as needed for wheezing or shortness of breath. (Patient not taking: Reported on 03/16/2016) 1 Inhaler 2  . ALPRAZolam (XANAX) 1 MG tablet Reported on 03/16/2016    . valACYclovir (VALTREX) 500 MG tablet Take 1 tablet (500 mg total) by mouth daily. 14 tablet 1   No current  facility-administered medications for this visit.   Facility-Administered Medications Ordered in Other Visits  Medication Dose Route Frequency Provider Last Rate Last Dose  . diphenhydrAMINE (BENADRYL) capsule 25 mg  25 mg Oral Once Chauncey Cruel, MD   25 mg at 03/16/16 0941  . heparin lock flush 100 unit/mL  500 Units Intracatheter Once PRN Chauncey Cruel, MD      . sodium chloride 0.9 % injection 10 mL  10 mL Intracatheter PRN Chauncey Cruel, MD      . trastuzumab (HERCEPTIN) 483 mg in sodium chloride 0.9 % 250 mL chemo infusion  6 mg/kg (Treatment Plan Actual) Intravenous Once Chauncey Cruel, MD        OBJECTIVE: middle-aged white woman who appears well  Filed Vitals:   03/16/16 0851  BP: 127/74  Pulse: 81  Temp: 98.4 F (36.9 C)  Resp: 18     Body mass index is 34.57 kg/(m^2).    ECOG FS:1 - Symptomatic but completely ambulatory  Skin: warm, dry  HEENT: sclerae anicteric, conjunctivae pink, oropharynx clear. No thrush or mucositis.  Lymph Nodes: No cervical or supraclavicular lymphadenopathy  Lungs: clear to auscultation bilaterally,  no rales, wheezes, or rhonci  Heart: regular rate and rhythm  Abdomen: round, soft, non tender, positive bowel sounds  Musculoskeletal: No focal spinal tenderness, no peripheral edema  Neuro: non focal, well oriented, appropriate affect  Breasts: deferred  LAB RESULTS:  CMP     Component Value Date/Time   NA 140 03/16/2016 0838   K 4.0 03/16/2016 0838   CO2 29 03/16/2016 0838   GLUCOSE 94 03/16/2016 0838   BUN 15.0 03/16/2016 0838   CREATININE 0.9 03/16/2016 0838   CALCIUM 8.8 03/16/2016 0838   PROT 6.6 03/16/2016 0838   ALBUMIN 3.6 03/16/2016 0838   AST 18 03/16/2016 0838   ALT 17 03/16/2016 0838   ALKPHOS 77 03/16/2016 0838   BILITOT 0.39 03/16/2016 0838    INo results found for: SPEP, UPEP  Lab Results  Component Value Date   WBC 4.8 03/16/2016   NEUTROABS 2.8 03/16/2016   HGB 12.8 03/16/2016   HCT 38.0 03/16/2016   MCV 99.0 03/16/2016   PLT 166 03/16/2016      Chemistry      Component Value Date/Time   NA 140 03/16/2016 0838   K 4.0 03/16/2016 0838   CO2 29 03/16/2016 0838   BUN 15.0 03/16/2016 0838   CREATININE 0.9 03/16/2016 0838      Component Value Date/Time   CALCIUM 8.8 03/16/2016 0838   ALKPHOS 77 03/16/2016 0838   AST 18 03/16/2016 0838   ALT 17 03/16/2016 0838   BILITOT 0.39 03/16/2016 0838       No results found for: LABCA2  No components found for: LSLHT342  No results for input(s): INR in the last 168 hours.  Urinalysis No results found for: COLORURINE, APPEARANCEUR, LABSPEC, PHURINE, GLUCOSEU, HGBUR, BILIRUBINUR, KETONESUR, PROTEINUR, UROBILINOGEN, NITRITE, LEUKOCYTESUR  STUDIES: No results found.   ASSESSMENT: 50 y.o. [place] woman status post left breast biopsy 04/14/2015 for a clinical T2 N0, stage IIA invasive ductal carcinoma, grade 2, estrogen and progesterone receptor positive, with HER-2 amplified, and an MIB-1 of 20%  (1). 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  (3) status post left lumpectomy and sentinel lung lymph node sampling 09/21/2015 for a ypT1c ypN0 Invasive ductal carcinoma, grade 2, with negative margins.  (4) radiation 10/27-12/13/16; Left breast/ 45 Gy at 1.8 Gy per fraction x 25 fractions.  Left breast boost/ 16 Gy at 2 Gy per fraction x 8 fractions  (5) tamoxifen started 02/08/2016  (6) genetics testing 09/06/2015 through the 8-gene Breast High/Moderate Risk Panel [facility] (Hope Pigeon, MD) found no deleterious mutations in ATM, BRCA1, BRCA2, CDH1, CHEK2, PALB2, PTEN, and TP53.   PLAN: Lucila continues to struggle with post traumatic stress, but she has surrounded herself with support groups and new experiences and is transitioning through this period in a healthy way. We discussed increasing the venlafaxine to 137m daily, and she does not think this is necessary at this time.   The labs were reviewed in detail and were stable. Her most recent echocardiogram showed a well preserved ejection fraction. She will proceed with her next dose of trastuzumab as planned today. She is tolerating the tamoxifen well and will continue this daily. She will continue the gabapentin for her hot flashes.  She will be due for an annual mammogram in April. She will return in June for her next follow up visit. She understands and agrees with this plan. She knows the goal of treatment in her case is cure. She has been encouraged to call with any issues that might arise before her next visit here.   Total time spent in appointment was 25 minutes, with greater than 50% of  the time spent face to face with the patient.   HLaurie Panda NP   03/16/2016 10:07 AM

## 2016-03-16 NOTE — Patient Instructions (Signed)
Cancer Center Discharge Instructions for Patients Receiving Chemotherapy  Today you received the following chemotherapy agents herceptin   To help prevent nausea and vomiting after your treatment, we encourage you to take your nausea medication as directed   If you develop nausea and vomiting that is not controlled by your nausea medication, call the clinic.   BELOW ARE SYMPTOMS THAT SHOULD BE REPORTED IMMEDIATELY:  *FEVER GREATER THAN 100.5 F  *CHILLS WITH OR WITHOUT FEVER  NAUSEA AND VOMITING THAT IS NOT CONTROLLED WITH YOUR NAUSEA MEDICATION  *UNUSUAL SHORTNESS OF BREATH  *UNUSUAL BRUISING OR BLEEDING  TENDERNESS IN MOUTH AND THROAT WITH OR WITHOUT PRESENCE OF ULCERS  *URINARY PROBLEMS  *BOWEL PROBLEMS  UNUSUAL RASH Items with * indicate a potential emergency and should be followed up as soon as possible.  Feel free to call the clinic you have any questions or concerns. The clinic phone number is [phone].  

## 2016-03-16 NOTE — Telephone Encounter (Signed)
appt made per 3/23 pof. Mammo sch for April 5 at 915 am at [facility]. Pt aware

## 2016-03-27 ENCOUNTER — Ambulatory Visit: Payer: BLUE CROSS/BLUE SHIELD | Admitting: Physical Therapy

## 2016-03-27 ENCOUNTER — Other Ambulatory Visit: Payer: Self-pay | Admitting: *Deleted

## 2016-03-27 ENCOUNTER — Ambulatory Visit: Payer: BLUE CROSS/BLUE SHIELD | Attending: Hematology and Oncology | Admitting: Physical Therapy

## 2016-03-27 DIAGNOSIS — Z483 Aftercare following surgery for neoplasm: Secondary | ICD-10-CM | POA: Diagnosis present

## 2016-03-27 DIAGNOSIS — C50412 Malignant neoplasm of upper-outer quadrant of left female breast: Secondary | ICD-10-CM

## 2016-03-27 NOTE — Progress Notes (Signed)
Received call from patient stating when she wakes up her arm is tingling and feels tight.  Denies pain.  She states she has to mover her fingers for awhile to get it to stop. She is flying to [place] on Friday and informed her we would refer her PT for evaluation.  She may want a sleeve for flying.  Referral placed.

## 2016-03-27 NOTE — Therapy (Signed)
[place], [place], [address] Phone: [phone]   Fax:  [phone]  Physical Therapy Evaluation  Patient Details  Name: Cynthia [name] MRN: [id number] Date of Birth: Jul 15, 1966 Referring Provider: Gentry Fitz, NP  Encounter Date: 03/27/2016      PT End of Session - 03/27/16 2020    Visit Number 1   Number of Visits 1   PT Start Time 0601   PT Stop Time 1520   PT Time Calculation (min) 45 min   Activity Tolerance Patient tolerated treatment well   Behavior During Therapy [facility] for tasks assessed/performed      Past Medical History  Diagnosis Date  . Breast cancer of upper-outer quadrant of left female breast ([place]) 04/16/2015  . Anxiety   . Hot flashes   . Breast cancer ([facility]) 03/2015    IDC of UOQ of left breast; ER/PR+, Her2+  . Skin cancer     Hx of melanoma at 60y; multiple basal cell carcinomas first dx in late 75s    Past Surgical History  Procedure Laterality Date  . Bladder suspension  2009  . Dnc    . Dilation and curettage of uterus    . Portacath placement Right 04/27/2015    Procedure: INSERTION PORT-A-CATH;  Surgeon: Rolm Bookbinder, MD;  Location: [facility];  Service: General;  Laterality: Right;  . Radioactive seed guided mastectomy with axillary sentinel lymph node biopsy Left 09/21/2015    Procedure: RADIOACTIVE SEED GUIDED LUMPECTOMY WITH AXILLARY SENTINEL LYMPH NODE BIOPSY;  Surgeon: Rolm Bookbinder, MD;  Location: [facility];  Service: General;  Laterality: Left;    There were no vitals filed for this visit.  Visit Diagnosis:  Aftercare following surgery for neoplasm - Plan: PT plan of care cert/re-cert      Subjective Assessment - 03/27/16 1439    Subjective Upper posterior arm feels achy, like Icy Hot, and tis just started a few days ago.  Did just start a new yoga/Pilates class.   Pertinent History [place] woman status post left  breast biopsy 04/14/2015 for a clinical T2 N0, stage IIA invasive ductal carcinoma, grade 2, estrogen and progesterone receptor positive, with HER-2 amplified.  Had neo-adjuvant chemo, lumpectomy (4 lymph nodes removed, all negative) and radiation.  Radiation completed in December or so.  Otherwise pretty healthy.   Patient Stated Goals see about left arm discomfort   Currently in Pain? Yes   Pain Score 1    Pain Location Arm   Pain Orientation Posterior;Upper   Pain Descriptors / Indicators Constant  like Icy Hot, annoying   Aggravating Factors  nothing   Pain Relieving Factors nothing            [facility] PT Assessment - 03/27/16 0001    Assessment   Medical Diagnosis left breast cancer   Referring Provider Gentry Fitz, NP   Onset Date/Surgical Date 04/14/15  diagnosis; lumpectomy approx. 09/22/15   Precautions   Precautions Other (comment)   Precaution Comments cancer precautions   Restrictions   Weight Bearing Restrictions No   Balance Screen   Has the patient fallen in the past 6 months No   Has the patient had a decrease in activity level because of a fear of falling?  No   Is the patient reluctant to leave their home because of a fear of falling?  No   Home Ecologist residence   Transport planner;Children  Type of [place] One level   Prior Function   Level of Independence Independent   Vocation Full time employment   Vocation Requirements sales rep by car; has to lift a laptop bag   Leisure Has just started running again (walk-run combo), less than a mile, but having some pain at ball of foot.  Used to run 5-6 miles, 3-4 days a week.  Worked out through chemo.   Cognition   Overall Cognitive Status Within Functional Limits for tasks assessed   Observation/Other Assessments   Quick DASH  20.45   Posture/Postural Control   Posture/Postural Control No significant limitations   ROM / Strength   AROM / PROM /  Strength AROM   AROM   Overall AROM Comments both shoulders WFL with slight limitation on left side compared to right           LYMPHEDEMA/ONCOLOGY QUESTIONNAIRE - 03/27/16 1459    Type   Cancer Type left breast   Surgeries   Lumpectomy Date 09/22/15   Number Lymph Nodes Removed 4   Treatment   Past Chemotherapy Treatment Yes   Past Radiation Treatment Yes   Lymphedema Assessments   Lymphedema Assessments Upper extremities   Right Upper Extremity Lymphedema   15 cm Proximal to Olecranon Process 29.8 cm   10 cm Proximal to Olecranon Process 28.5 cm   Olecranon Process 25.9 cm   15 cm Proximal to Ulnar Styloid Process 25.2 cm   10 cm Proximal to Ulnar Styloid Process 22 cm   Just Proximal to Ulnar Styloid Process 16.7 cm   Across Hand at [employer] 20 cm   At [place] of 2nd Digit 6.5 cm   Left Upper Extremity Lymphedema   15 cm Proximal to Olecranon Process 30.4 cm   10 cm Proximal to Olecranon Process 29.1 cm   Olecranon Process 26.3 cm   15 cm Proximal to Ulnar Styloid Process 24.8 cm   10 cm Proximal to Ulnar Styloid Process 21.7 cm   Just Proximal to Ulnar Styloid Process 16 cm   Across Hand at [employer] 19 cm   At [place] of 2nd Digit 6.5 cm           Quick Dash - 03/27/16 0001    Open a tight or new jar No difficulty   Do heavy household chores (wash walls, wash floors) Mild difficulty   Carry a shopping bag or briefcase Mild difficulty   Wash your back No difficulty   Use a knife to cut food No difficulty   Recreational activities in which you take some force or impact through your arm, shoulder, or hand (golf, hammering, tennis) Mild difficulty   During the past week, to what extent has your arm, shoulder or hand problem interfered with your normal social activities with family, friends, neighbors, or groups? Slightly   During the past week, to what extent has your arm, shoulder or hand problem limited your work or other regular daily activities Slightly    Arm, shoulder, or hand pain. Moderate   Tingling (pins and needles) in your arm, shoulder, or hand Moderate   Difficulty Sleeping No difficulty   DASH Score 20.45 %                     PT Education - 03/27/16 2020    Education provided Yes   Education Details how and where to obtain a compression sleeve; when to wear compression  sleeve   Person(s) Educated Patient   Methods Explanation;Handout  ABC class handout used   Comprehension Verbalized understanding                [facility] Goals - 03/27/16 2031    CC Long Term Goal  #1   Title Pt. will be knowledgeable about where and how to obtain a compression sleeve and the potential benefit of wearing one.   Time 1   Period Days   Status Achieved            Plan - 03/27/16 2024    Clinical Impression Statement Patient came in today because she was referred, not feeling quite sure what her goal was by coming in.  She did report the recent onset of left upper arm achiness and some concern about whether this could be lymphedema. Circumference measurements of both arms show some fairly parallel increases compared to pre-treatment measurements taken nearly a year ago; she has gain a few pounds since then, which may account for those increases.  Her left upper arm is a little bigger than the right, but not enough to feel concerned that this is lymphedema, and she does have a small risk.  Her shoulder ROM is fairly good, if slightly limited on left compared to right.  Although she says she attended ABC class, she seemed unaware of some of what was discussed at that class.  We reviewed today her small lymphedema risk but that a compression sleeve is an easy way to potentially avoid or postpone the onset of lymphedema if worn in certain situations that may be risky.  She plans to pursue this prior to a flight she is taking this Friday to [facility].   Pt will benefit from skilled therapeutic intervention in order to  improve on the following deficits Decreased knowledge of precautions;Decreased knowledge of use of DME;Pain   Clinical Impairments Affecting Rehab Potential none   PT Frequency One time visit   PT Treatment/Interventions ADLs/Self Care Home Management   PT Next Visit Plan No further therapy is planned at this time.  Patient does plan to obtain a compression sleeve and has been instructed in appropriate times to wear that.   Consulted and Agree with Plan of Care Patient         Problem List Patient Active Problem List   Diagnosis Date Noted  . Bronchitis 02/24/2016  . Hearing loss associated with syndrome of both ears 12/02/2015  . Genetic testing 09/06/2015  . History of skin cancer 08/24/2015  . Excessive tearing 08/05/2015  . Hot flashes 07/15/2015  . Drug-induced skin rash 05/15/2015  . Rash 05/12/2015  . Dehydration 05/12/2015  . Esophagitis 05/12/2015  . Migraines 04/21/2015  . Breast cancer of upper-outer quadrant of left female breast ([place]) 04/16/2015    SALISBURY,DONNA 03/27/2016, 8:36 PM  [place], [place], [address] Phone: [phone]   Fax:  [phone]  Name: Cynthia [name] MRN: [id number] Date of Birth: 04-May-1966   Serafina Royals, PT 03/27/2016 8:36 PM

## 2016-04-06 ENCOUNTER — Other Ambulatory Visit: Payer: BLUE CROSS/BLUE SHIELD

## 2016-04-06 ENCOUNTER — Ambulatory Visit: Payer: BLUE CROSS/BLUE SHIELD

## 2016-04-10 ENCOUNTER — Telehealth: Payer: Self-pay | Admitting: *Deleted

## 2016-04-10 NOTE — Telephone Encounter (Signed)
Received call from patient stating she had her mammogram last week and there were some calcifications in the right breast that are going to be biopsied 4/20.  She was worried this would interfere with herceptin infusion Thursday.  Informed her that she would be done with herceptin in plenty of time to go to her biopsy.  Gave encouragement as she is worried about this.   Encouraged her to call with any needs or concerns.

## 2016-04-12 ENCOUNTER — Other Ambulatory Visit: Payer: Self-pay | Admitting: Radiology

## 2016-04-13 ENCOUNTER — Other Ambulatory Visit: Payer: Self-pay | Admitting: *Deleted

## 2016-04-13 ENCOUNTER — Ambulatory Visit (HOSPITAL_BASED_OUTPATIENT_CLINIC_OR_DEPARTMENT_OTHER): Payer: BLUE CROSS/BLUE SHIELD

## 2016-04-13 ENCOUNTER — Other Ambulatory Visit (HOSPITAL_BASED_OUTPATIENT_CLINIC_OR_DEPARTMENT_OTHER): Payer: BLUE CROSS/BLUE SHIELD

## 2016-04-13 VITALS — BP 127/71 | HR 79 | Temp 98.6°F | Resp 20

## 2016-04-13 DIAGNOSIS — Z5112 Encounter for antineoplastic immunotherapy: Secondary | ICD-10-CM

## 2016-04-13 DIAGNOSIS — C50412 Malignant neoplasm of upper-outer quadrant of left female breast: Secondary | ICD-10-CM

## 2016-04-13 DIAGNOSIS — G43009 Migraine without aura, not intractable, without status migrainosus: Secondary | ICD-10-CM

## 2016-04-13 LAB — COMPREHENSIVE METABOLIC PANEL
ALBUMIN: 3.5 g/dL (ref 3.5–5.0)
ALK PHOS: 65 U/L (ref 40–150)
ALT: 27 U/L (ref 0–55)
ANION GAP: 8 meq/L (ref 3–11)
AST: 20 U/L (ref 5–34)
BUN: 12.2 mg/dL (ref 7.0–26.0)
CALCIUM: 9.2 mg/dL (ref 8.4–10.4)
CO2: 28 mEq/L (ref 22–29)
Chloride: 105 mEq/L (ref 98–109)
Creatinine: 0.9 mg/dL (ref 0.6–1.1)
EGFR: 75 mL/min/{1.73_m2} — AB (ref >90)
Glucose: 98 mg/dl (ref 70–140)
POTASSIUM: 4.4 meq/L (ref 3.5–5.1)
Sodium: 141 mEq/L (ref 136–145)
Total Protein: 6.8 g/dL (ref 6.4–8.3)

## 2016-04-13 LAB — CBC WITH DIFFERENTIAL/PLATELET
BASO%: 0.9 % (ref 0.0–2.0)
BASOS ABS: 0 10*3/uL (ref 0.0–0.1)
EOS ABS: 0.1 10*3/uL (ref 0.0–0.5)
EOS%: 2.1 % (ref 0.0–7.0)
HEMATOCRIT: 41.3 % (ref 34.8–46.6)
HEMOGLOBIN: 13.8 g/dL (ref 11.6–15.9)
LYMPH#: 1.5 10*3/uL (ref 0.9–3.3)
LYMPH%: 30.6 % (ref 14.0–49.7)
MCH: 33.1 pg (ref 25.1–34.0)
MCHC: 33.5 g/dL (ref 31.5–36.0)
MCV: 98.9 fL (ref 79.5–101.0)
MONO#: 0.3 10*3/uL (ref 0.1–0.9)
MONO%: 6 % (ref 0.0–14.0)
NEUT#: 2.9 10*3/uL (ref 1.5–6.5)
NEUT%: 60.4 % (ref 38.4–76.8)
PLATELETS: 204 10*3/uL (ref 145–400)
RBC: 4.18 10*6/uL (ref 3.70–5.45)
RDW: 13.3 % (ref 11.2–14.5)
WBC: 4.9 10*3/uL (ref 3.9–10.3)

## 2016-04-13 MED ORDER — TRASTUZUMAB CHEMO INJECTION 440 MG
6.0000 mg/kg | Freq: Once | INTRAVENOUS | Status: AC
  Administered 2016-04-13: 483 mg via INTRAVENOUS
  Filled 2016-04-13: qty 23

## 2016-04-13 MED ORDER — HEPARIN SOD (PORK) LOCK FLUSH 100 UNIT/ML IV SOLN
500.0000 [IU] | Freq: Once | INTRAVENOUS | Status: AC | PRN
  Administered 2016-04-13: 500 [IU]
  Filled 2016-04-13: qty 5

## 2016-04-13 MED ORDER — DIPHENHYDRAMINE HCL 25 MG PO CAPS
ORAL_CAPSULE | ORAL | Status: AC
  Filled 2016-04-13: qty 1

## 2016-04-13 MED ORDER — ACETAMINOPHEN 325 MG PO TABS
ORAL_TABLET | ORAL | Status: AC
  Filled 2016-04-13: qty 2

## 2016-04-13 MED ORDER — DIPHENHYDRAMINE HCL 25 MG PO CAPS: 25.0000 mg | ORAL_CAPSULE | Freq: Once | ORAL | Status: DC

## 2016-04-13 MED ORDER — SODIUM CHLORIDE 0.9 % IV SOLN
Freq: Once | INTRAVENOUS | Status: AC
  Administered 2016-04-13: 09:00:00 via INTRAVENOUS

## 2016-04-13 MED ORDER — ACETAMINOPHEN 325 MG PO TABS
650.0000 mg | ORAL_TABLET | Freq: Once | ORAL | Status: AC
  Administered 2016-04-13: 650 mg via ORAL

## 2016-04-13 MED ORDER — SODIUM CHLORIDE 0.9 % IJ SOLN
10.0000 mL | INTRAMUSCULAR | Status: DC | PRN
  Administered 2016-04-13: 10 mL
  Filled 2016-04-13: qty 10

## 2016-04-13 NOTE — Patient Instructions (Signed)
[facility] Discharge Instructions for Patients Receiving Chemotherapy  Today you received the following chemotherapy agents:  Herceptin  To help prevent nausea and vomiting after your treatment, we encourage you to take your nausea medication as ordered per MD.   If you develop nausea and vomiting that is not controlled by your nausea medication, call the clinic.   BELOW ARE SYMPTOMS THAT SHOULD BE REPORTED IMMEDIATELY:  *FEVER GREATER THAN 100.5 F  *CHILLS WITH OR WITHOUT FEVER  NAUSEA AND VOMITING THAT IS NOT CONTROLLED WITH YOUR NAUSEA MEDICATION  *UNUSUAL SHORTNESS OF BREATH  *UNUSUAL BRUISING OR BLEEDING  TENDERNESS IN MOUTH AND THROAT WITH OR WITHOUT PRESENCE OF ULCERS  *URINARY PROBLEMS  *BOWEL PROBLEMS  UNUSUAL RASH Items with * indicate a potential emergency and should be followed up as soon as possible.  Feel free to call the clinic you have any questions or concerns. The clinic phone number is [phone].  Please show the CHEMO ALERT CARD at check-in to the Emergency Department and triage nurse.   

## 2016-04-18 ENCOUNTER — Other Ambulatory Visit: Payer: Self-pay | Admitting: *Deleted

## 2016-04-18 DIAGNOSIS — C50412 Malignant neoplasm of upper-outer quadrant of left female breast: Secondary | ICD-10-CM

## 2016-04-18 MED ORDER — VENLAFAXINE HCL ER 75 MG PO CP24: ORAL_CAPSULE | ORAL | Status: DC

## 2016-04-27 ENCOUNTER — Ambulatory Visit: Payer: BLUE CROSS/BLUE SHIELD | Admitting: Oncology

## 2016-04-27 ENCOUNTER — Ambulatory Visit: Payer: BLUE CROSS/BLUE SHIELD

## 2016-04-27 ENCOUNTER — Other Ambulatory Visit: Payer: BLUE CROSS/BLUE SHIELD

## 2016-05-03 ENCOUNTER — Other Ambulatory Visit: Payer: Self-pay | Admitting: *Deleted

## 2016-05-03 ENCOUNTER — Other Ambulatory Visit: Payer: Self-pay | Admitting: Oncology

## 2016-05-03 DIAGNOSIS — C50412 Malignant neoplasm of upper-outer quadrant of left female breast: Secondary | ICD-10-CM

## 2016-05-04 ENCOUNTER — Other Ambulatory Visit (HOSPITAL_BASED_OUTPATIENT_CLINIC_OR_DEPARTMENT_OTHER): Payer: BLUE CROSS/BLUE SHIELD

## 2016-05-04 ENCOUNTER — Encounter: Payer: Self-pay | Admitting: *Deleted

## 2016-05-04 ENCOUNTER — Other Ambulatory Visit: Payer: Self-pay | Admitting: *Deleted

## 2016-05-04 ENCOUNTER — Ambulatory Visit (HOSPITAL_BASED_OUTPATIENT_CLINIC_OR_DEPARTMENT_OTHER): Payer: BLUE CROSS/BLUE SHIELD

## 2016-05-04 VITALS — BP 137/92 | HR 82 | Temp 98.3°F | Resp 18

## 2016-05-04 DIAGNOSIS — C50412 Malignant neoplasm of upper-outer quadrant of left female breast: Secondary | ICD-10-CM

## 2016-05-04 DIAGNOSIS — Z5112 Encounter for antineoplastic immunotherapy: Secondary | ICD-10-CM | POA: Diagnosis not present

## 2016-05-04 LAB — CBC WITH DIFFERENTIAL/PLATELET
BASO%: 0.8 % (ref 0.0–2.0)
BASOS ABS: 0 10*3/uL (ref 0.0–0.1)
EOS%: 2.7 % (ref 0.0–7.0)
Eosinophils Absolute: 0.1 10*3/uL (ref 0.0–0.5)
HEMATOCRIT: 42 % (ref 34.8–46.6)
HGB: 14 g/dL (ref 11.6–15.9)
LYMPH#: 1.9 10*3/uL (ref 0.9–3.3)
LYMPH%: 34.7 % (ref 14.0–49.7)
MCH: 32.6 pg (ref 25.1–34.0)
MCHC: 33.2 g/dL (ref 31.5–36.0)
MCV: 98.2 fL (ref 79.5–101.0)
MONO#: 0.3 10*3/uL (ref 0.1–0.9)
MONO%: 6.5 % (ref 0.0–14.0)
NEUT#: 3 10*3/uL (ref 1.5–6.5)
NEUT%: 55.3 % (ref 38.4–76.8)
PLATELETS: 178 10*3/uL (ref 145–400)
RBC: 4.28 10*6/uL (ref 3.70–5.45)
RDW: 13.2 % (ref 11.2–14.5)
WBC: 5.3 10*3/uL (ref 3.9–10.3)

## 2016-05-04 LAB — COMPREHENSIVE METABOLIC PANEL
ALT: 18 U/L (ref 0–55)
ANION GAP: 8 meq/L (ref 3–11)
AST: 17 U/L (ref 5–34)
Albumin: 3.7 g/dL (ref 3.5–5.0)
Alkaline Phosphatase: 71 U/L (ref 40–150)
BUN: 14.2 mg/dL (ref 7.0–26.0)
CALCIUM: 9.3 mg/dL (ref 8.4–10.4)
CHLORIDE: 106 meq/L (ref 98–109)
CO2: 27 mEq/L (ref 22–29)
CREATININE: 0.9 mg/dL (ref 0.6–1.1)
EGFR: 75 mL/min/{1.73_m2} — ABNORMAL LOW (ref >90)
Glucose: 96 mg/dl (ref 70–140)
POTASSIUM: 4.2 meq/L (ref 3.5–5.1)
Sodium: 140 mEq/L (ref 136–145)
Total Bilirubin: 0.39 mg/dL (ref 0.20–1.20)
Total Protein: 6.9 g/dL (ref 6.4–8.3)

## 2016-05-04 MED ORDER — SODIUM CHLORIDE 0.9 % IV SOLN
Freq: Once | INTRAVENOUS | Status: AC
  Administered 2016-05-04: 11:00:00 via INTRAVENOUS

## 2016-05-04 MED ORDER — SODIUM CHLORIDE 0.9 % IJ SOLN
10.0000 mL | INTRAMUSCULAR | Status: DC | PRN
  Filled 2016-05-04: qty 10

## 2016-05-04 MED ORDER — ACETAMINOPHEN 325 MG PO TABS
ORAL_TABLET | ORAL | Status: AC
  Filled 2016-05-04: qty 2

## 2016-05-04 MED ORDER — ACETAMINOPHEN 325 MG PO TABS
650.0000 mg | ORAL_TABLET | Freq: Once | ORAL | Status: AC
  Administered 2016-05-04: 650 mg via ORAL

## 2016-05-04 MED ORDER — HEPARIN SOD (PORK) LOCK FLUSH 100 UNIT/ML IV SOLN
500.0000 [IU] | Freq: Once | INTRAVENOUS | Status: DC | PRN
  Filled 2016-05-04: qty 5

## 2016-05-04 MED ORDER — VALACYCLOVIR HCL 500 MG PO TABS: 500.0000 mg | ORAL_TABLET | Freq: Two times a day (BID) | ORAL | Status: DC

## 2016-05-04 MED ORDER — TRASTUZUMAB CHEMO INJECTION 440 MG
6.0000 mg/kg | Freq: Once | INTRAVENOUS | Status: AC
  Administered 2016-05-04: 483 mg via INTRAVENOUS
  Filled 2016-05-04: qty 23

## 2016-05-04 NOTE — Patient Instructions (Signed)
[facility] Discharge Instructions for Patients Receiving Chemotherapy  Today you received the following chemotherapy agents: Herceptin   To help prevent nausea and vomiting after your treatment, we encourage you to take your nausea medication as directed.    If you develop nausea and vomiting that is not controlled by your nausea medication, call the clinic.   BELOW ARE SYMPTOMS THAT SHOULD BE REPORTED IMMEDIATELY:  *FEVER GREATER THAN 100.5 F  *CHILLS WITH OR WITHOUT FEVER  NAUSEA AND VOMITING THAT IS NOT CONTROLLED WITH YOUR NAUSEA MEDICATION  *UNUSUAL SHORTNESS OF BREATH  *UNUSUAL BRUISING OR BLEEDING  TENDERNESS IN MOUTH AND THROAT WITH OR WITHOUT PRESENCE OF ULCERS  *URINARY PROBLEMS  *BOWEL PROBLEMS  UNUSUAL RASH Items with * indicate a potential emergency and should be followed up as soon as possible.  Feel free to call the clinic you have any questions or concerns. The clinic phone number is [phone].  Please show the CHEMO ALERT CARD at check-in to the Emergency Department and triage nurse.   

## 2016-05-04 NOTE — Progress Notes (Signed)
Pt declined Benadryl again.

## 2016-05-08 ENCOUNTER — Encounter: Payer: Self-pay | Admitting: Oncology

## 2016-05-08 NOTE — Progress Notes (Signed)
Called patient to find out if she still needs the [facility] card for Herceptin and Perjeta due to enrollment ended for 12 month period 05/05/16. Patient states she does for Herceptin only. She has 1 more and no longer taking Perjeta. Advised patient I would call [employer] to confirm expiration and re-enroll her. Called [place] and spoke with Tiffany whom confirmed the date. She advised me to re-enroll the patient online. Re-enrolled patient online and printed co-pay card for my book.

## 2016-05-24 ENCOUNTER — Other Ambulatory Visit: Payer: Self-pay | Admitting: *Deleted

## 2016-05-24 DIAGNOSIS — C50412 Malignant neoplasm of upper-outer quadrant of left female breast: Secondary | ICD-10-CM

## 2016-05-25 ENCOUNTER — Encounter: Payer: Self-pay | Admitting: *Deleted

## 2016-05-25 ENCOUNTER — Ambulatory Visit (HOSPITAL_BASED_OUTPATIENT_CLINIC_OR_DEPARTMENT_OTHER): Payer: BLUE CROSS/BLUE SHIELD | Admitting: Oncology

## 2016-05-25 ENCOUNTER — Telehealth: Payer: Self-pay | Admitting: Oncology

## 2016-05-25 ENCOUNTER — Other Ambulatory Visit (HOSPITAL_BASED_OUTPATIENT_CLINIC_OR_DEPARTMENT_OTHER): Payer: BLUE CROSS/BLUE SHIELD

## 2016-05-25 ENCOUNTER — Ambulatory Visit (HOSPITAL_BASED_OUTPATIENT_CLINIC_OR_DEPARTMENT_OTHER): Payer: BLUE CROSS/BLUE SHIELD

## 2016-05-25 VITALS — BP 142/88 | HR 93 | Temp 98.5°F | Resp 18 | Ht 60.75 in | Wt 178.9 lb

## 2016-05-25 DIAGNOSIS — Z79810 Long term (current) use of selective estrogen receptor modulators (SERMs): Secondary | ICD-10-CM

## 2016-05-25 DIAGNOSIS — C50412 Malignant neoplasm of upper-outer quadrant of left female breast: Secondary | ICD-10-CM

## 2016-05-25 DIAGNOSIS — Z5112 Encounter for antineoplastic immunotherapy: Secondary | ICD-10-CM

## 2016-05-25 LAB — CBC WITH DIFFERENTIAL/PLATELET
BASO%: 0.7 % (ref 0.0–2.0)
Basophils Absolute: 0 10*3/uL (ref 0.0–0.1)
EOS ABS: 0.1 10*3/uL (ref 0.0–0.5)
EOS%: 1.9 % (ref 0.0–7.0)
HCT: 40.3 % (ref 34.8–46.6)
HGB: 13.6 g/dL (ref 11.6–15.9)
LYMPH%: 25.6 % (ref 14.0–49.7)
MCH: 33.2 pg (ref 25.1–34.0)
MCHC: 33.7 g/dL (ref 31.5–36.0)
MCV: 98.6 fL (ref 79.5–101.0)
MONO#: 0.4 10*3/uL (ref 0.1–0.9)
MONO%: 6 % (ref 0.0–14.0)
NEUT%: 65.8 % (ref 38.4–76.8)
NEUTROS ABS: 4.1 10*3/uL (ref 1.5–6.5)
Platelets: 183 10*3/uL (ref 145–400)
RBC: 4.09 10*6/uL (ref 3.70–5.45)
RDW: 13.6 % (ref 11.2–14.5)
WBC: 6.3 10*3/uL (ref 3.9–10.3)
lymph#: 1.6 10*3/uL (ref 0.9–3.3)

## 2016-05-25 LAB — COMPREHENSIVE METABOLIC PANEL
ALT: 12 U/L (ref 0–55)
AST: 14 U/L (ref 5–34)
Albumin: 3.6 g/dL (ref 3.5–5.0)
Alkaline Phosphatase: 67 U/L (ref 40–150)
Anion Gap: 8 mEq/L (ref 3–11)
BILIRUBIN TOTAL: 0.35 mg/dL (ref 0.20–1.20)
BUN: 14.4 mg/dL (ref 7.0–26.0)
CO2: 28 meq/L (ref 22–29)
Calcium: 9.3 mg/dL (ref 8.4–10.4)
Chloride: 106 mEq/L (ref 98–109)
Creatinine: 0.9 mg/dL (ref 0.6–1.1)
EGFR: 78 mL/min/{1.73_m2} — AB (ref >90)
GLUCOSE: 67 mg/dL — AB (ref 70–140)
POTASSIUM: 4.2 meq/L (ref 3.5–5.1)
SODIUM: 141 meq/L (ref 136–145)
TOTAL PROTEIN: 6.9 g/dL (ref 6.4–8.3)

## 2016-05-25 MED ORDER — HEPARIN SOD (PORK) LOCK FLUSH 100 UNIT/ML IV SOLN
500.0000 [IU] | Freq: Once | INTRAVENOUS | Status: AC | PRN
  Administered 2016-05-25: 500 [IU]
  Filled 2016-05-25: qty 5

## 2016-05-25 MED ORDER — SODIUM CHLORIDE 0.9 % IV SOLN
Freq: Once | INTRAVENOUS | Status: AC
  Administered 2016-05-25: 12:00:00 via INTRAVENOUS

## 2016-05-25 MED ORDER — TRASTUZUMAB CHEMO INJECTION 440 MG
6.0000 mg/kg | Freq: Once | INTRAVENOUS | Status: AC
  Administered 2016-05-25: 483 mg via INTRAVENOUS
  Filled 2016-05-25: qty 23

## 2016-05-25 MED ORDER — DIPHENHYDRAMINE HCL 25 MG PO CAPS: 25.0000 mg | ORAL_CAPSULE | Freq: Once | ORAL | Status: DC

## 2016-05-25 MED ORDER — DIPHENHYDRAMINE HCL 25 MG PO CAPS
ORAL_CAPSULE | ORAL | Status: AC
  Filled 2016-05-25: qty 1

## 2016-05-25 MED ORDER — SODIUM CHLORIDE 0.9 % IJ SOLN
10.0000 mL | INTRAMUSCULAR | Status: DC | PRN
  Administered 2016-05-25: 10 mL
  Filled 2016-05-25: qty 10

## 2016-05-25 MED ORDER — ACETAMINOPHEN 325 MG PO TABS
ORAL_TABLET | ORAL | Status: AC
  Filled 2016-05-25: qty 2

## 2016-05-25 MED ORDER — ACETAMINOPHEN 325 MG PO TABS
650.0000 mg | ORAL_TABLET | Freq: Once | ORAL | Status: AC
  Administered 2016-05-25: 650 mg via ORAL

## 2016-05-25 NOTE — Telephone Encounter (Signed)
appt made and avs printed °

## 2016-05-25 NOTE — Progress Notes (Signed)
[facility]  [phone] [phone]     ID: Cynthia [name] DOB: 1966-01-18  MR#: [id number]  BFX#:[id number]  Patient Care Team: No Pcp Per Patient as PCP - General (General Practice) Rolm Bookbinder, MD as Consulting Physician (General Surgery) Chauncey Cruel, MD as Consulting Physician (Oncology) Kyung Rudd, MD as Consulting Physician (Radiation Oncology) Mauro Kaufmann, RN as Registered Nurse Rockwell Germany, RN as Registered Nurse PCP: No PCP Per Patient OTHER MD: Christene Slates M.D.  CHIEF COMPLAINT: Triple positive breast cancer  CURRENT TREATMENT:  tamoxifen  BREAST CANCER HISTORY: From the original intake note:  Cynthia [name] had noted a change in her left breast on self-exam the last week in March. She brought it to Dr. Trellis Paganini attention and on 04/13/2015 underwent diagnostic bilateral mammography and left ultrasonography at [facility]. This found the breast composition to be category D. An area of architectural distortion was noted in the left breast upper outer quadrant correlating to the palpable mass. By ultrasound there was a 2 cm lobulated mass in the left breast upper outer quadrant which was hypoechoic. There was a 4 mm nodule immediately adjacent to this felt to be a satellite lesion.  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  The patient's subsequent history is as detailed below  INTERVAL HISTORY: Cynthia [name] returns today for follow up of her triple positive breast cancer, accompanied by her mother (who will be turning 17 this month).. She completes a year of trastuzumab today. She has tolerated that well, and her most recent echocardiogram shows a normal ejection fraction  She is also on tamoxifen. She does have significant hot flashes from  that. We have pretty much maxed out the venlafaxine and gabapentin. That has not resolved the issue. 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 She has tried melatonin for that with no success. She tried magnesium for the hot flashes again with no success. She has had a couple of skin biopsies through the free screening clinic here last week but the results are not yet N. Recall she has a remote history of melanoma. Aside from these issues she is a little forgetful and anxious but much less depressed than before. She participating in the "[facility]" program and is recruiting others to participate since she had such a good experience with that. Detailed review of systems today was otherwise stable  PAST MEDICAL HISTORY: Past Medical History  Diagnosis Date  . Breast cancer of upper-outer quadrant of left female breast ([place]) 04/16/2015  . Anxiety   . Hot flashes   . Breast cancer ([place]) 03/2015    IDC of UOQ of left breast; ER/PR+, Her2+  . Skin cancer     Hx of melanoma at 66y; multiple basal cell carcinomas first dx in late 68s   melanoma, removed in [place]   PAST SURGICAL HISTORY: Past Surgical History  Procedure Laterality Date  . Bladder suspension  2009  . Dnc    . Dilation and curettage of uterus    . Portacath placement Right 04/27/2015    Procedure: INSERTION PORT-A-CATH;  Surgeon: Rolm Bookbinder, MD;  Location: [facility];  Service:  General;  Laterality: Right;  . Radioactive seed guided mastectomy with axillary sentinel lymph node biopsy Left 09/21/2015    Procedure: RADIOACTIVE SEED GUIDED LUMPECTOMY WITH AXILLARY SENTINEL LYMPH NODE BIOPSY;  Surgeon: Rolm Bookbinder, MD;  Location: [facility];  Service: General;  Laterality: Left;    FAMILY HISTORY Family History  Problem Relation Age of Onset  . Breast  cancer Maternal Aunt     dx. 4s  . Lung cancer Maternal Grandmother     dx. late 50s-early 60s; "metastasis to breast"; smoker  . Lung cancer Paternal Grandfather     dx. 76s; smokeless tobacco user  . Other Mother     hx of TAH  . Colon polyps Father     unspecified amt  . Melanoma Maternal Uncle 64  . Heart Problems Paternal Grandmother   . Diabetes Paternal Grandmother   . Melanoma Other 61   the patient's parents are still living as of April 2016. They are divorced. The patient is an only child. The patient's paternal grandfather and maternal grandmother had a history of lung cancer. On the mother's side there is a history of melanoma. The patient's mother's only sister was diagnosed with breast cancer in her 33s. There is no history of ovarian cancer in the family.   GYNECOLOGIC HISTORY:  No LMP recorded.  menarche age 66, first live birth age 76, the patient is GX P3. Her periods are ongoing but irregular. She has a copper IUD in place for contraception. She took oral contraceptives for approximately 15 years in the past with no complications.   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 She is single, and lives at home with her father, who is disabled and has a history of depression. At home also is the patient's daughter Cynthia [name] "Cynthia [name], 51 years old as of April 2016. The patient's 2 other daughters are Cynthia [name], lives in [place] and is a physical therapy technician, and Cynthia [name], lives in [place] [facility] and works in Press photographer.     ADVANCED DIRECTIVES:  not in place.    HEALTH MAINTENANCE: Social History  Substance Use Topics  . Smoking status: Never Smoker   . Smokeless tobacco: Never Used  . Alcohol Use: Yes     Comment: occasion drink; only smoked cigs socially while in college     Colonoscopy: never   PAP:  Bone density: never   Lipid panel:  Allergies  Allergen Reactions  . Zofran [Ondansetron Hcl] Other (See Comments)     headache  . Adhesive [Tape] Rash    Steri strip    Current Outpatient Prescriptions  Medication Sig Dispense Refill  . albuterol (PROVENTIL HFA;VENTOLIN HFA) 108 (90 Base) MCG/ACT inhaler Inhale 1-2 puffs into the lungs every 6 (six) hours as needed for wheezing or shortness of breath. 1 Inhaler 2  . ALPRAZolam (XANAX) 1 MG tablet Reported on 03/16/2016    . [facility] MELATONIN 3 MG TABS Take 1 tablet by mouth at bedtime as needed.  5  . gabapentin (NEURONTIN) 300 MG capsule Take 1 capsule (300 mg total) by mouth at bedtime. 90 capsule 4  . loratadine (CLARITIN) 10 MG tablet Take 1 tablet (10 mg total) by mouth daily. 90 tablet 2  . MAGNESIUM PO Take 1 tablet by mouth daily.    . Multiple Vitamins-Minerals (CENTRUM SILVER PO) Take by mouth. Reported on 01/20/2016    . tamoxifen (NOLVADEX) 20 MG tablet Take 20 mg by mouth  daily.    . valACYclovir (VALTREX) 500 MG tablet Take 1 tablet (500 mg total) by mouth 2 (two) times daily. 60 tablet 1  . venlafaxine XR (EFFEXOR-XR) 75 MG 24 hr capsule Take 2 capsules daily at bedtime. 180 capsule 2   No current facility-administered medications for this visit.    OBJECTIVE: middle-aged white woman In no acute distress Filed Vitals:   05/25/16 0949  BP: 142/88  Pulse: 93  Temp: 98.5 F (36.9 C)  Resp: 18     Body mass index is 34.08 kg/(m^2).    ECOG FS:0 - Asymptomatic  Sclerae unicteric, pupils round and equal Oropharynx clear and moist-- no thrush or other lesions No cervical or supraclavicular adenopathy Lungs no rales or rhonchi Heart regular rate and rhythm Abd soft, nontender, positive bowel sounds MSK no focal spinal tenderness, no upper extremity lymphedema Neuro: nonfocal, well oriented, appropriate affect Breasts: The right breast is unremarkable. The left breast is status post lumpectomy and radiation. There is no evidence of local recurrence. The left axilla is benign.    LAB RESULTS:  CMP     Component Value Date/Time   NA 140  05/04/2016 0929   K 4.2 05/04/2016 0929   CO2 27 05/04/2016 0929   GLUCOSE 96 05/04/2016 0929   BUN 14.2 05/04/2016 0929   CREATININE 0.9 05/04/2016 0929   CALCIUM 9.3 05/04/2016 0929   PROT 6.9 05/04/2016 0929   ALBUMIN 3.7 05/04/2016 0929   AST 17 05/04/2016 0929   ALT 18 05/04/2016 0929   ALKPHOS 71 05/04/2016 0929   BILITOT 0.39 05/04/2016 0929    INo results found for: SPEP, UPEP  Lab Results  Component Value Date   WBC 6.3 05/25/2016   NEUTROABS 4.1 05/25/2016   HGB 13.6 05/25/2016   HCT 40.3 05/25/2016   MCV 98.6 05/25/2016   PLT 183 05/25/2016      Chemistry      Component Value Date/Time   NA 140 05/04/2016 0929   K 4.2 05/04/2016 0929   CO2 27 05/04/2016 0929   BUN 14.2 05/04/2016 0929   CREATININE 0.9 05/04/2016 0929      Component Value Date/Time   CALCIUM 9.3 05/04/2016 0929   ALKPHOS 71 05/04/2016 0929   AST 17 05/04/2016 0929   ALT 18 05/04/2016 0929   BILITOT 0.39 05/04/2016 0929       No results found for: LABCA2  No components found for: DUKGU542  No results for input(s): INR in the last 168 hours.  Urinalysis No results found for: COLORURINE, APPEARANCEUR, LABSPEC, [place], GLUCOSEU, HGBUR, BILIRUBINUR, KETONESUR, PROTEINUR, UROBILINOGEN, NITRITE, LEUKOCYTESUR  STUDIES: Mammography May 2017 at [facility] unremarkable   ASSESSMENT: 51 y.o. [place] woman status post left breast biopsy 04/14/2015 for a clinical T2 N0, stage IIA invasive ductal carcinoma, grade 2, estrogen and progesterone receptor positive, with HER-2 amplified, and an MIB-1 of 20%  (1). 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  (4) radiation 10/27-12/13/16; Left breast/ 45  Gy at 1.8 Gy per fraction x 25 fractions.  Left breast boost/ 16 Gy at 2 Gy per fraction x 8 fractions  (5) tamoxifen started 02/08/2016  (6) genetics testing 09/06/2015 through the 8-gene Breast High/Moderate Risk Panel [facility] (Hope Pigeon, MD) found no deleterious mutations in ATM, BRCA1, BRCA2, CDH1, CHEK2, PALB2, PTEN, and TP53.   PLAN: Leightyn is doing much better as far as her depression/posttraumatic stress problems are concerned. She is now an avid fly fissure, although she has not yet got any fish. I suggested she give the trails to recovery a try as well.  She completes the trastuzumab today. That is a real milestone. She did quite well with it and her most recent echo remains normal area she is going to contact Dr. Cristal Generous nurse to arrange to have her port removed in July.  We discussed hot flashes which continue to be an issue for her. We could try TTS 1 patches or we could try low-dose Megace. After much discussion she decided she is going to live with the problem for now, continuing the venlafaxine during the day and the gabapentin at bedtime.  She is going to see me again in October. If she follows up with Dr. Donne Hazel in January, that she could then see me next in April of next year. The plan of course is to continue tamoxifen for total of 5 years.  She knows to call for any problems that may develop before her next visit here.      Chauncey Cruel, MD   05/25/2016 10:02 AM

## 2016-05-25 NOTE — Patient Instructions (Signed)
[facility] Discharge Instructions for Patients Receiving Chemotherapy  Today you received the following chemotherapy agents: Herceptin   To help prevent nausea and vomiting after your treatment, we encourage you to take your nausea medication as directed.    If you develop nausea and vomiting that is not controlled by your nausea medication, call the clinic.   BELOW ARE SYMPTOMS THAT SHOULD BE REPORTED IMMEDIATELY:  *FEVER GREATER THAN 100.5 F  *CHILLS WITH OR WITHOUT FEVER  NAUSEA AND VOMITING THAT IS NOT CONTROLLED WITH YOUR NAUSEA MEDICATION  *UNUSUAL SHORTNESS OF BREATH  *UNUSUAL BRUISING OR BLEEDING  TENDERNESS IN MOUTH AND THROAT WITH OR WITHOUT PRESENCE OF ULCERS  *URINARY PROBLEMS  *BOWEL PROBLEMS  UNUSUAL RASH Items with * indicate a potential emergency and should be followed up as soon as possible.  Feel free to call the clinic you have any questions or concerns. The clinic phone number is [phone].  Please show the CHEMO ALERT CARD at check-in to the Emergency Department and triage nurse.   

## 2016-06-27 ENCOUNTER — Other Ambulatory Visit: Payer: Self-pay | Admitting: Nurse Practitioner

## 2016-08-10 ENCOUNTER — Other Ambulatory Visit: Payer: Self-pay | Admitting: Oncology

## 2016-08-10 DIAGNOSIS — C50412 Malignant neoplasm of upper-outer quadrant of left female breast: Secondary | ICD-10-CM

## 2016-09-20 ENCOUNTER — Other Ambulatory Visit: Payer: Self-pay | Admitting: *Deleted

## 2016-09-20 DIAGNOSIS — C50412 Malignant neoplasm of upper-outer quadrant of left female breast: Secondary | ICD-10-CM

## 2016-09-21 ENCOUNTER — Other Ambulatory Visit (HOSPITAL_BASED_OUTPATIENT_CLINIC_OR_DEPARTMENT_OTHER): Payer: BLUE CROSS/BLUE SHIELD

## 2016-09-21 DIAGNOSIS — C50412 Malignant neoplasm of upper-outer quadrant of left female breast: Secondary | ICD-10-CM | POA: Diagnosis not present

## 2016-09-21 LAB — COMPREHENSIVE METABOLIC PANEL
ALBUMIN: 3.3 g/dL — AB (ref 3.5–5.0)
ALK PHOS: 70 U/L (ref 40–150)
ALT: 16 U/L (ref 0–55)
AST: 15 U/L (ref 5–34)
Anion Gap: 9 mEq/L (ref 3–11)
BUN: 12.8 mg/dL (ref 7.0–26.0)
CHLORIDE: 106 meq/L (ref 98–109)
CO2: 26 meq/L (ref 22–29)
Calcium: 9 mg/dL (ref 8.4–10.4)
Creatinine: 0.8 mg/dL (ref 0.6–1.1)
GLUCOSE: 86 mg/dL (ref 70–140)
POTASSIUM: 4.2 meq/L (ref 3.5–5.1)
SODIUM: 141 meq/L (ref 136–145)
Total Bilirubin: 0.45 mg/dL (ref 0.20–1.20)
Total Protein: 6.7 g/dL (ref 6.4–8.3)

## 2016-09-21 LAB — CBC WITH DIFFERENTIAL/PLATELET
BASO%: 1 % (ref 0.0–2.0)
BASOS ABS: 0.1 10*3/uL (ref 0.0–0.1)
EOS ABS: 0.2 10*3/uL (ref 0.0–0.5)
EOS%: 3.2 % (ref 0.0–7.0)
HCT: 41.3 % (ref 34.8–46.6)
HEMOGLOBIN: 13.7 g/dL (ref 11.6–15.9)
LYMPH%: 28.6 % (ref 14.0–49.7)
MCH: 33.8 pg (ref 25.1–34.0)
MCHC: 33.2 g/dL (ref 31.5–36.0)
MCV: 101.7 fL — AB (ref 79.5–101.0)
MONO#: 0.4 10*3/uL (ref 0.1–0.9)
MONO%: 6.6 % (ref 0.0–14.0)
NEUT#: 3.7 10*3/uL (ref 1.5–6.5)
NEUT%: 60.6 % (ref 38.4–76.8)
Platelets: 186 10*3/uL (ref 145–400)
RBC: 4.06 10*6/uL (ref 3.70–5.45)
RDW: 12.8 % (ref 11.2–14.5)
WBC: 6.2 10*3/uL (ref 3.9–10.3)
lymph#: 1.8 10*3/uL (ref 0.9–3.3)

## 2016-09-24 DIAGNOSIS — S2249XA Multiple fractures of ribs, unspecified side, initial encounter for closed fracture: Secondary | ICD-10-CM

## 2016-09-24 HISTORY — DX: Multiple fractures of ribs, unspecified side, initial encounter for closed fracture: S22.49XA

## 2016-09-28 ENCOUNTER — Encounter: Payer: Self-pay | Admitting: *Deleted

## 2016-09-28 ENCOUNTER — Ambulatory Visit (HOSPITAL_BASED_OUTPATIENT_CLINIC_OR_DEPARTMENT_OTHER): Payer: BLUE CROSS/BLUE SHIELD | Admitting: Oncology

## 2016-09-28 ENCOUNTER — Telehealth: Payer: Self-pay | Admitting: *Deleted

## 2016-09-28 VITALS — BP 141/88 | HR 96 | Temp 98.1°F | Resp 18 | Ht 60.75 in | Wt 186.8 lb

## 2016-09-28 DIAGNOSIS — C50412 Malignant neoplasm of upper-outer quadrant of left female breast: Secondary | ICD-10-CM | POA: Diagnosis not present

## 2016-09-28 DIAGNOSIS — Z79811 Long term (current) use of aromatase inhibitors: Secondary | ICD-10-CM

## 2016-09-28 DIAGNOSIS — Z170 Estrogen receptor positive status [ER+]: Secondary | ICD-10-CM

## 2016-09-28 MED ORDER — ESTROGENS, CONJUGATED 0.625 MG/GM VA CREA: 1.0000 | TOPICAL_CREAM | Freq: Every day | VAGINAL | 12 refills | Status: DC

## 2016-09-28 NOTE — Telephone Encounter (Signed)
"  I'm in the lobby waiting to register.  You all are busy.  I do not want to miss my appointment." Notified collaborative and to lobby to facilitate. Documented arrival: 9:56 am for today's 9:30 appointment.

## 2016-09-28 NOTE — Progress Notes (Signed)
[place]  [phone] [phone]     ID: Cynthia [name] DOB: 05-Nov-1966  MR#: [id number]  LTJ#:[id number]  Patient Care Team: No Pcp Per Patient as PCP - General (General Practice) Rolm Bookbinder, MD as Consulting Physician (General Surgery) Chauncey Cruel, MD as Consulting Physician (Oncology) Kyung Rudd, MD as Consulting Physician (Radiation Oncology) Mauro Kaufmann, RN as Registered Nurse Rockwell Germany, RN as Registered Nurse Rolm Bookbinder, MD as Consulting Physician (Dermatology) PCP: No PCP Per Patient OTHER MD: Christene Slates M.D.  CHIEF COMPLAINT: Triple positive breast cancer  CURRENT TREATMENT:  tamoxifen  BREAST CANCER HISTORY: From the original intake note:  Cynthia [name] had noted a change in her left breast on self-exam the last week in March. She brought it to Dr. Trellis Paganini attention and on 04/13/2015 underwent diagnostic bilateral mammography and left ultrasonography at [facility]. This found the breast composition to be category D. An area of architectural distortion was noted in the left breast upper outer quadrant correlating to the palpable mass. By ultrasound there was a 2 cm lobulated mass in the left breast upper outer quadrant which was hypoechoic. There was a 4 mm nodule immediately adjacent to this felt to be a satellite lesion.  Biopsy of the left breast mass in question 04/14/2015 showed ([id number]) invasive ductal carcinoma, grade 2, estrogen receptor 90% positive, progesterone receptor 90% positive, with an MIB-1 of 20%, and HER-2 amplified, the signals ratio being 5.36.  The patient's subsequent history is as detailed below  INTERVAL HISTORY: Cynthia [name] returns today for follow up of her estrogen receptor positive breast cancer. These areas on tamoxifen, which she tolerates well, except for hot flashes, which occur about twice daily and sometimes at night. She does not have vaginal wetness, but she does have significant vaginal dryness  problems and pain with intercourse.  Since the last visit here she had her port removed without event.  REVIEW OF SYSTEMS: Breuna unfortunately has taken up smoking and is no smoking about a half a pack per day. She says this is the only thing that really relaxes her. On the plus side she is going to your hunting and flyfishing all the time. Sometimes she wakes up with discomfort in her right arm, and she does sleep on the right side most of the time. She describes herself is mildly fatigued. She has joint aches. Here and there which are not more intense or persistent and before. She has some urinary dribbling at times. She feels anxious but denies depression. A detailed review of systems today was otherwise stable.  PAST MEDICAL HISTORY: Past Medical History:  Diagnosis Date  . Anxiety   . Breast cancer ([place]) 03/2015   IDC of UOQ of left breast; ER/PR+, Her2+  . Breast cancer of upper-outer quadrant of left female breast ([place]) 04/16/2015  . Hot flashes   . Skin cancer    Hx of melanoma at 71y; multiple basal cell carcinomas first dx in late 29s   melanoma, removed in [place]   PAST SURGICAL HISTORY: Past Surgical History:  Procedure Laterality Date  . BLADDER SUSPENSION  2009  . DILATION AND CURETTAGE OF UTERUS    . dnc    . PORTACATH PLACEMENT Right 04/27/2015   Procedure: INSERTION PORT-A-CATH;  Surgeon: Rolm Bookbinder, MD;  Location: [place];  Service: General;  Laterality: Right;  . RADIOACTIVE SEED GUIDED MASTECTOMY WITH AXILLARY SENTINEL LYMPH NODE BIOPSY Left 09/21/2015   Procedure: RADIOACTIVE SEED GUIDED LUMPECTOMY WITH  AXILLARY SENTINEL LYMPH NODE BIOPSY;  Surgeon: Rolm Bookbinder, MD;  Location: [place];  Service: General;  Laterality: Left;    FAMILY HISTORY Family History  Problem Relation Age of Onset  . Breast cancer Maternal Aunt     dx. 56s  . Lung cancer Maternal Grandmother     dx. late 50s-early 60s; "metastasis to  breast"; smoker  . Lung cancer Paternal Grandfather     dx. 65s; smokeless tobacco user  . Other Mother     hx of TAH  . Colon polyps Father     unspecified amt  . Melanoma Maternal Uncle 64  . Heart Problems Paternal Grandmother   . Diabetes Paternal Grandmother   . Melanoma Other 68   the patient's parents are still living as of April 2016. They are divorced. The patient is an only child. The patient's paternal grandfather and maternal grandmother had a history of lung cancer. On the mother's side there is a history of melanoma. The patient's mother's only sister was diagnosed with breast cancer in her 13s. There is no history of ovarian cancer in the family.   GYNECOLOGIC HISTORY:  No LMP recorded.  menarche age 25, first live birth age 42, the patient is GX P3. Her periods are ongoing but irregular. She has a copper IUD in place for contraception. She took oral contraceptives for approximately 15 years in the past with no complications.   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 She is single, and lives at home with her father, who is disabled and has a history of depression. At home also is the patient's daughter [name] "Cynthia [name], 68 years old as of April 2016. The patient's 2 other daughters are Cynthia [name], lives in [place] and is a physical therapy technician, and Cynthia [name], lives in [facility] and works in Press photographer.     ADVANCED DIRECTIVES:  not in place.    HEALTH MAINTENANCE: Social History  Substance Use Topics  . Smoking status: Never Smoker  . Smokeless tobacco: Never Used  . Alcohol use Yes     Comment: occasion drink; only smoked cigs socially while in college     Colonoscopy: never   PAP:  Bone density: never   Lipid panel:  Allergies  Allergen Reactions  . Zofran [Ondansetron Hcl] Other (See Comments)    headache  . Adhesive [Tape] Rash    Steri strip    Current Outpatient Prescriptions  Medication Sig Dispense  Refill  . albuterol (PROVENTIL HFA;VENTOLIN HFA) 108 (90 Base) MCG/ACT inhaler Inhale 1-2 puffs into the lungs every 6 (six) hours as needed for wheezing or shortness of breath. 1 Inhaler 2  . ALPRAZolam (XANAX) 1 MG tablet Reported on 03/16/2016    . conjugated estrogens (PREMARIN) vaginal cream Place 1 Applicatorful vaginally daily. 42.5 g 12  . [facility]S MELATONIN 3 MG TABS Take 1 tablet by mouth at bedtime as needed.  5  . gabapentin (NEURONTIN) 300 MG capsule Take 1 capsule (300 mg total) by mouth at bedtime. 90 capsule 4  . loratadine (CLARITIN) 10 MG tablet TAKE 1 TABLET BY MOUTH DAILY 90 tablet 2  . MAGNESIUM PO Take 1 tablet by mouth daily.    . Multiple Vitamins-Minerals (CENTRUM SILVER PO) Take by mouth. Reported on 01/20/2016    . tamoxifen (NOLVADEX) 20 MG tablet Take 20 mg by mouth daily.    . valACYclovir (VALTREX) 500 MG tablet Take 1 tablet (500 mg total)  by mouth 2 (two) times daily. 60 tablet 1  . venlafaxine XR (EFFEXOR-XR) 75 MG 24 hr capsule Take 2 capsules daily at bedtime. 180 capsule 2   No current facility-administered medications for this visit.     OBJECTIVE: middle-aged white womanWho appears stated age  50:   09/28/16 1034  BP: (!) 141/88  Pulse: 96  Resp: 18  Temp: 98.1 F (36.7 C)     Body mass index is 35.59 kg/m.    ECOG FS:1 - Symptomatic but completely ambulatory  Sclerae unicteric, EOMs intact Oropharynx clear and moist No cervical or supraclavicular adenopathy Lungs no rales or rhonchi Heart regular rate and rhythm Abd soft, nontender, positive bowel sounds MSK no focal spinal tenderness, no upper extremity lymphedema Neuro: nonfocal, well oriented, appropriate affect Breasts: The right breast has no suspicious findings. The left breast is status post lumpectomy and radiation. There is no evidence of local recurrence. The left axilla is benign.   LAB RESULTS:  CMP     Component Value Date/Time   NA 141 09/21/2016 0807   K 4.2 09/21/2016  0807   CO2 26 09/21/2016 0807   GLUCOSE 86 09/21/2016 0807   BUN 12.8 09/21/2016 0807   CREATININE 0.8 09/21/2016 0807   CALCIUM 9.0 09/21/2016 0807   PROT 6.7 09/21/2016 0807   ALBUMIN 3.3 (L) 09/21/2016 0807   AST 15 09/21/2016 0807   ALT 16 09/21/2016 0807   ALKPHOS 70 09/21/2016 0807   BILITOT 0.45 09/21/2016 0807    INo results found for: SPEP, UPEP  Lab Results  Component Value Date   WBC 6.2 09/21/2016   NEUTROABS 3.7 09/21/2016   HGB 13.7 09/21/2016   HCT 41.3 09/21/2016   MCV 101.7 (H) 09/21/2016   PLT 186 09/21/2016      Chemistry      Component Value Date/Time   NA 141 09/21/2016 0807   K 4.2 09/21/2016 0807   CO2 26 09/21/2016 0807   BUN 12.8 09/21/2016 0807   CREATININE 0.8 09/21/2016 0807      Component Value Date/Time   CALCIUM 9.0 09/21/2016 0807   ALKPHOS 70 09/21/2016 0807   AST 15 09/21/2016 0807   ALT 16 09/21/2016 0807   BILITOT 0.45 09/21/2016 0807       No results found for: LABCA2  No components found for: QXIHW388  No results for input(s): INR in the last 168 hours.  Urinalysis No results found for: COLORURINE, APPEARANCEUR, LABSPEC, [place], GLUCOSEU, HGBUR, BILIRUBINUR, KETONESUR, PROTEINUR, UROBILINOGEN, NITRITE, LEUKOCYTESUR  STUDIES: Mammography May 2017 [facility] unremarkable   ASSESSMENT: 50 y.o. [place] woman status post left breast Upper outer quadrant biopsy 04/14/2015 for a clinical T2 N0, stage IIA invasive ductal carcinoma, grade 2, estrogen and progesterone receptor positive, with HER-2 amplified, and an MIB-1 of 20%  (1). 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  (4) radiation 10/27-12/13/16; Left breast/ 45 Gy at 1.8 Gy per fraction x 25 fractions.  Left breast boost/ 16 Gy at  2 Gy per fraction x 8 fractions  (5) tamoxifen started 02/08/2016  (6) genetics testing 09/06/2015 through the 8-gene [facility] High/Moderate [facility] (Hope Pigeon, MD) found no deleterious mutations in ATM, BRCA1, BRCA2, CDH1, CHEK2, PALB2, PTEN, and TP53.   PLAN: Madell is tolerating the tamoxifen well and the plan will be to continue that at least 2 years before considering switching to an aromatase inhibitor.  We discussed the fact that while she is on tamoxifen and it is okay with me if she uses a vaginal estrogen. This is a major issue for her so I didn't write for an estrogen cream she can use. I also referred her to our intimacy and pelvic health program.  I have strongly advised her regarding smoking cessation. We do have programs available that she may access at her discretion.  She will have repeat mammography later this month. She will be seeing Dr. Donne Hazel in 3-4 months. She will see me again in May. She knows to call for any problems that may develop before that visit.      Chauncey Cruel, MD   09/28/2016 10:56 AM

## 2016-10-06 ENCOUNTER — Telehealth: Payer: Self-pay | Admitting: *Deleted

## 2016-10-28 ENCOUNTER — Encounter: Payer: Self-pay | Admitting: Oncology

## 2016-11-24 ENCOUNTER — Other Ambulatory Visit: Payer: Self-pay | Admitting: *Deleted

## 2016-11-24 MED ORDER — ESTROGENS, CONJUGATED 0.625 MG/GM VA CREA: 1.0000 | TOPICAL_CREAM | Freq: Every day | VAGINAL | 12 refills | Status: DC

## 2016-11-28 ENCOUNTER — Other Ambulatory Visit: Payer: Self-pay | Admitting: Oncology

## 2016-11-28 DIAGNOSIS — C50412 Malignant neoplasm of upper-outer quadrant of left female breast: Secondary | ICD-10-CM

## 2016-12-08 ENCOUNTER — Other Ambulatory Visit: Payer: Self-pay | Admitting: Oncology

## 2016-12-08 NOTE — Telephone Encounter (Signed)
No entry 

## 2016-12-25 HISTORY — PX: PORT-A-CATH REMOVAL: SHX5289

## 2017-01-10 IMAGING — MR MR BREAST BILATERAL W WO CONTRAST
9 of 13 series · 32 of 48 positions shown · IV contrast (16ml Multihance)
Comparison: Prior breast MRI 04/26/2015 and previous mammogram and
ultrasound in 2383 [facility].

CLINICAL DATA: Biopsy-proven left breast carcinoma. The patient has
undergone neoadjuvant chemotherapy. MRI performed to follow-up
response.

LABS:  None performed
EXAM:
BILATERAL BREAST MRI WITH AND WITHOUT CONTRAST
TECHNIQUE: Multiplanar, multisequence MR images of both breasts were obtained
prior to and following the intravenous administration of 16 ml of
MultiHance.

[Series 2: T2 · axial · 3.0mm · 0.94mm/px · z∈[-58,+104]mm · 3 of 55 slices shown]
[im 1/55]
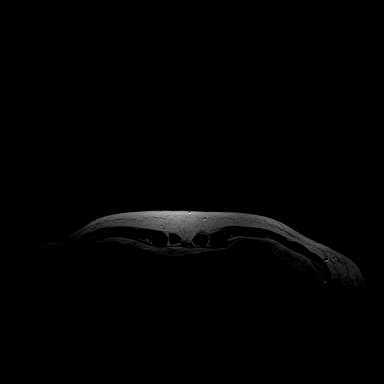
[im 28/55]
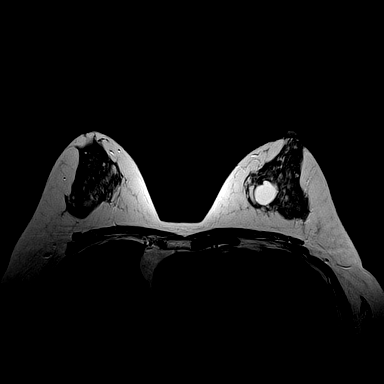
[im 55/55]
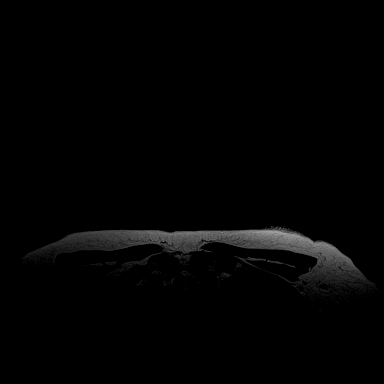

[Series 3: t2_tirm_tra ipat (a-p) · axial · 3.0mm · 0.70mm/px · z∈[-58,+104]mm · 2 of 55 slices shown]
[im 1/55]
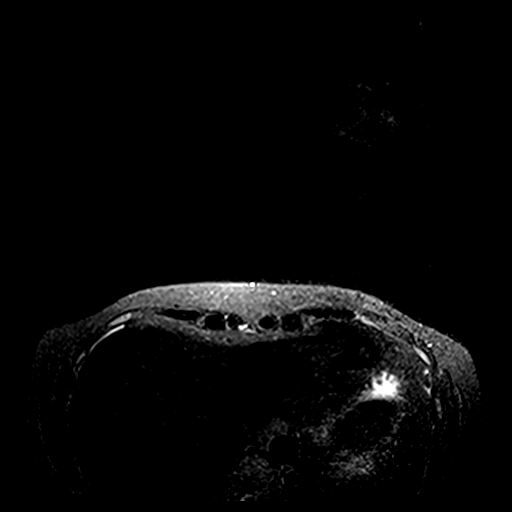
[im 55/55]
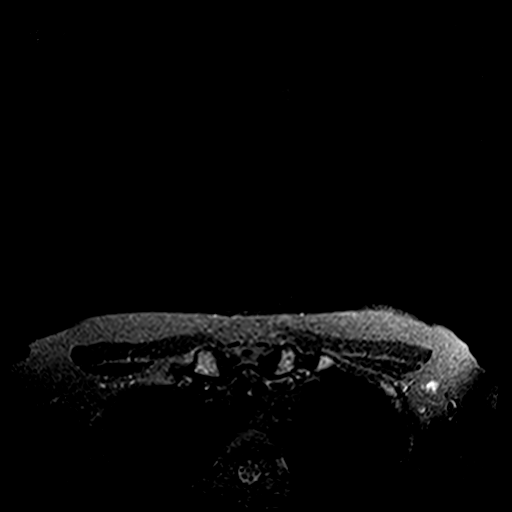

[Series 4: fl3d pre-cm no · axial · non-contrast · 1.2mm · 0.94mm/px · z∈[-63,+109]mm · 5 of 144 slices shown]
[im 1/144]
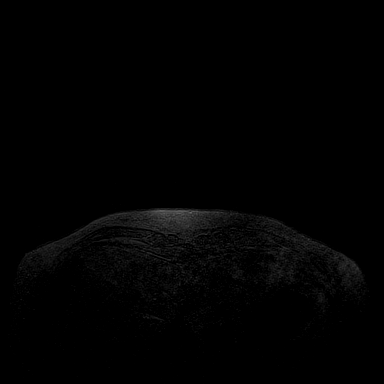
[im 36/144]
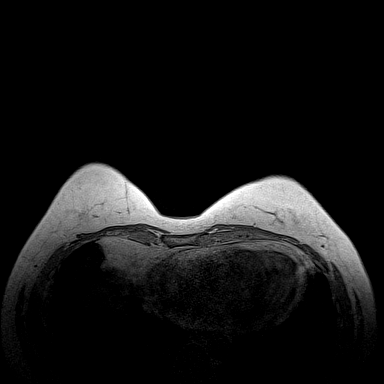
[im 72/144]
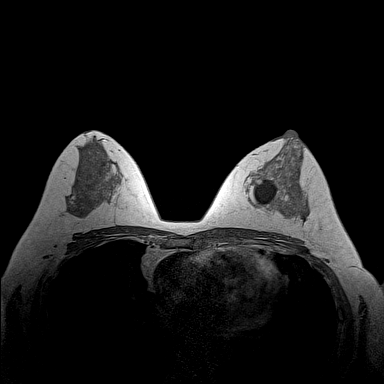
[im 108/144]
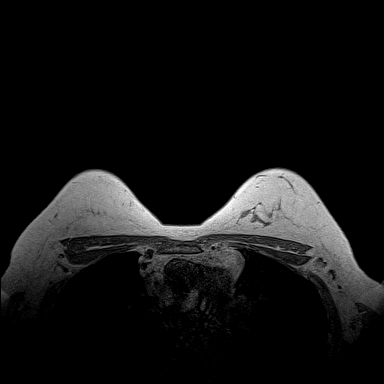
[im 144/144]
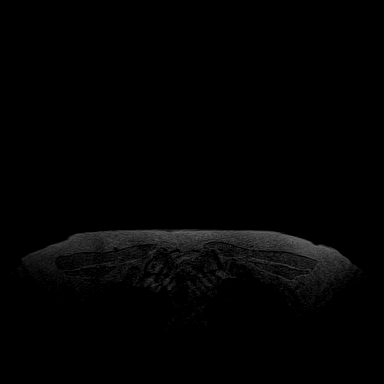

[Series 5: fl3d pre-cm · axial · non-contrast · 1.2mm · 0.94mm/px · z∈[-63,+109]mm · 5 of 144 slices shown]
[im 1/144]
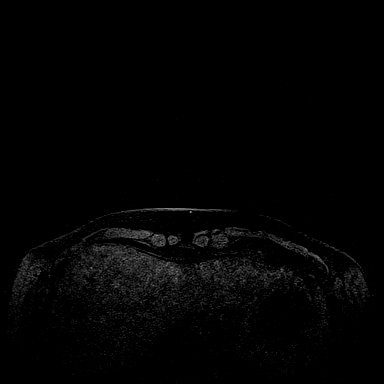
[im 36/144]
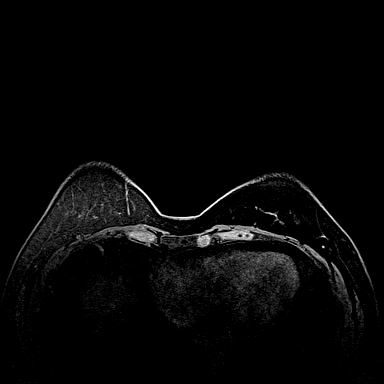
[im 72/144]
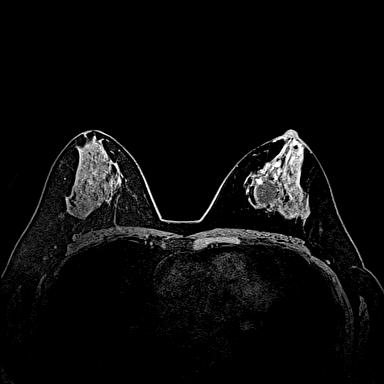
[im 108/144]
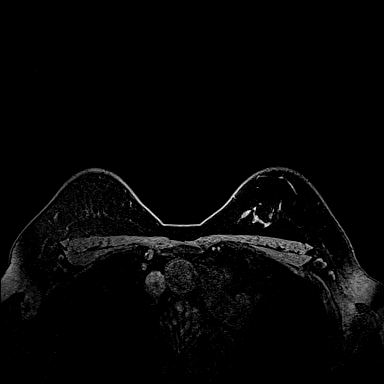
[im 144/144]
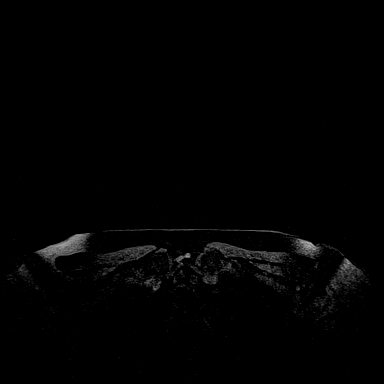

[Series 6: fl3d post-cm 20 · axial · 1.2mm · 0.94mm/px · z∈[-63,+109]mm · 5 of 144 slices shown (1 of 3)]
[im 1/144]
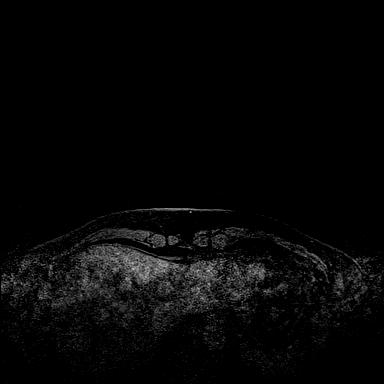
[im 36/144]
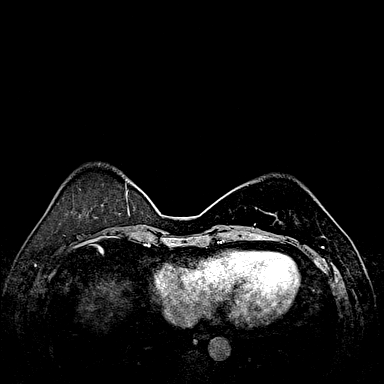
[im 72/144]
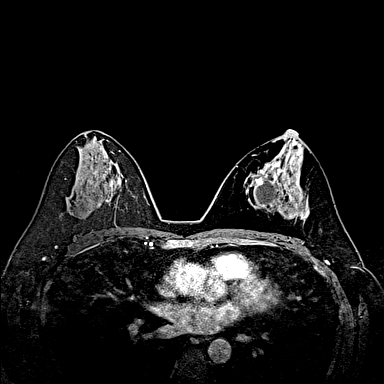
[im 108/144]
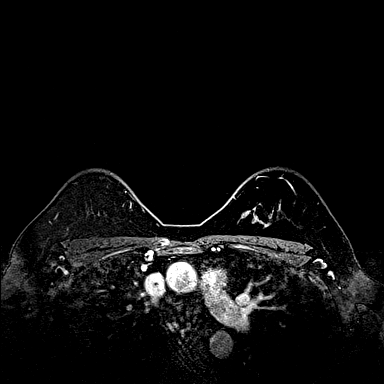
[im 144/144]
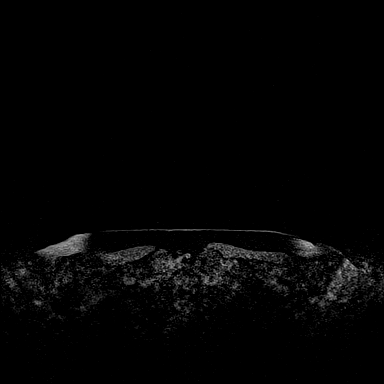

[Series 7: fl3d post-cm 20 · axial · 1.2mm · 0.94mm/px · z∈[-63,+109]mm · 5 of 144 slices shown (2 of 3)]
[im 1/144]
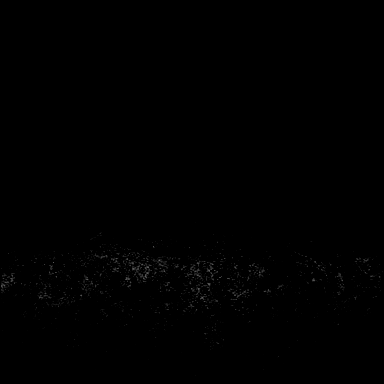
[im 36/144]
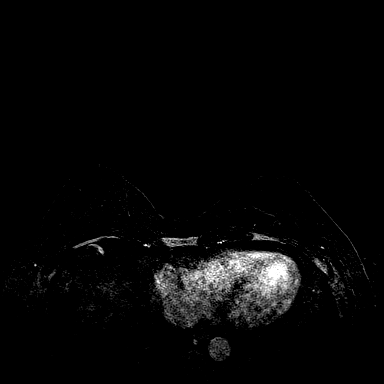
[im 72/144]
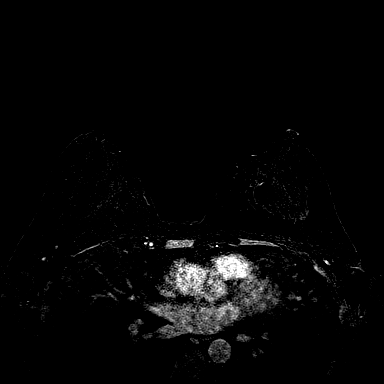
[im 108/144]
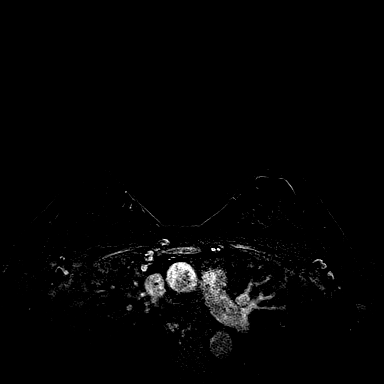
[im 144/144]
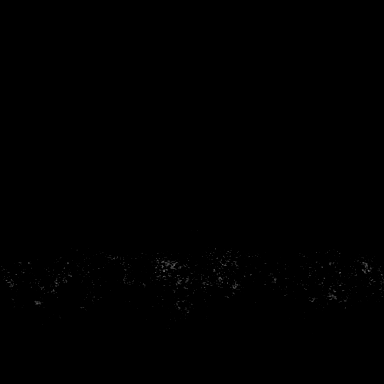

[Series 8: fl3d post-cm 20 · axial · 172.8mm · 0.94mm/px · 1 of 1 slices shown (3 of 3)]
[im 1/1]
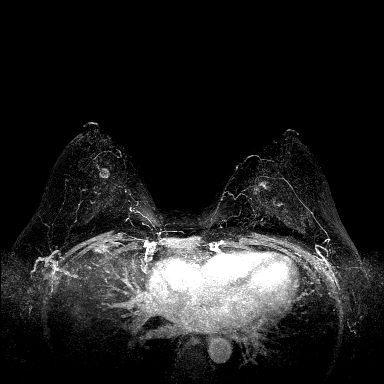

[Series 9: fl3d post-cm 3min · axial · 1.2mm · 0.94mm/px · z∈[-63,+109]mm · 5 of 144 slices shown]
[im 1/144]
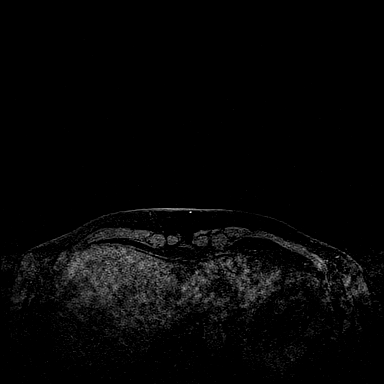
[im 36/144]
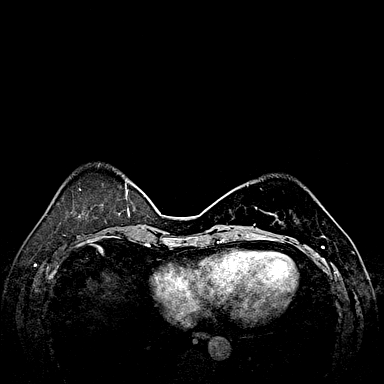
[im 72/144]
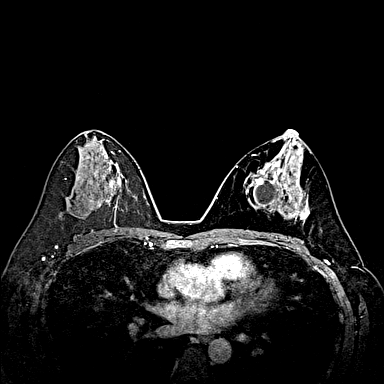
[im 108/144]
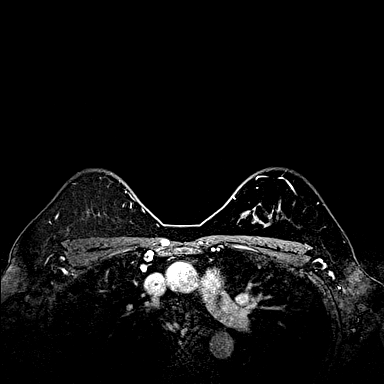
[im 144/144]
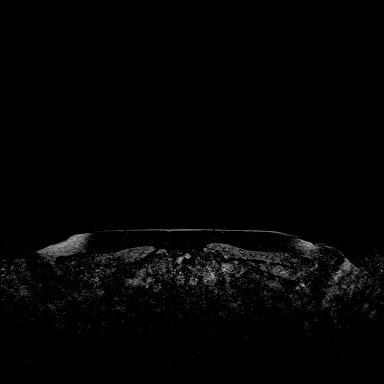

[Series 10: fl3d post-cm 3min_sub · axial · 1.2mm · 0.94mm/px · 1 of 144 slices shown]
[im 1/144]
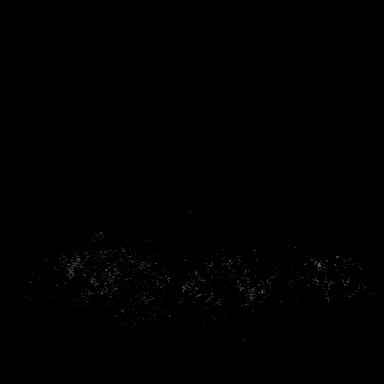

[32 of 48 positions shown; findings below may reference images not displayed]

THREE-DIMENSIONAL MR IMAGE RENDERING ON INDEPENDENT WORKSTATION:

Three-dimensional MR images were rendered by post-processing of the
original MR data on an independent workstation. The
three-dimensional MR images were interpreted, and findings are
reported in the following complete MRI report for this study. Three
dimensional images were evaluated at the independent [employer]
workstation
FINDINGS: Breast composition: d. Extreme fibroglandular tissue.

Background parenchymal enhancement: Mild

Right breast: Are scattered T2 bright cysts. A few these have mild
peripheral enhancement. There are no suspicious masses or suspicious
enhancement.

Left breast: Biopsy clip artifact is seen in the posterior third of
the upper outer left breast. No residual mass or abnormal
enhancement is seen at the site of the previously biopsied left
breast carcinoma. There are multiple scattered T2 bright cysts in
the left breast, a few of which have mild peripheral enhancement.

Lymph nodes: No abnormal appearing lymph nodes.

Ancillary findings:  None.
IMPRESSION: Significant positive response to neoadjuvant chemotherapy. No
residual mass or abnormal enhancement is seen in the upper outer
left breast posteriorly at the site of the previously biopsied
carcinoma. Biopsy clip is present in this region.

Bilateral breast cysts.

RECOMMENDATION:
Continue treatment planning and annual mammography.

BI-RADS CATEGORY  6: Known biopsy-proven malignancy.

## 2017-04-02 ENCOUNTER — Other Ambulatory Visit: Payer: Self-pay

## 2017-04-02 MED ORDER — TAMOXIFEN CITRATE 20 MG PO TABS: 20.0000 mg | ORAL_TABLET | Freq: Every day | ORAL | 3 refills | Status: DC

## 2017-04-10 ENCOUNTER — Encounter: Payer: Self-pay | Admitting: Oncology

## 2017-04-24 ENCOUNTER — Other Ambulatory Visit: Payer: Self-pay | Admitting: Nurse Practitioner

## 2017-04-24 DIAGNOSIS — J40 Bronchitis, not specified as acute or chronic: Secondary | ICD-10-CM

## 2017-04-26 ENCOUNTER — Telehealth: Payer: Self-pay

## 2017-04-26 ENCOUNTER — Other Ambulatory Visit (HOSPITAL_BASED_OUTPATIENT_CLINIC_OR_DEPARTMENT_OTHER): Payer: BLUE CROSS/BLUE SHIELD

## 2017-04-26 DIAGNOSIS — C50412 Malignant neoplasm of upper-outer quadrant of left female breast: Secondary | ICD-10-CM

## 2017-04-26 DIAGNOSIS — Z170 Estrogen receptor positive status [ER+]: Principal | ICD-10-CM

## 2017-04-26 LAB — CBC WITH DIFFERENTIAL/PLATELET
BASO%: 0.4 % (ref 0.0–2.0)
Basophils Absolute: 0 10*3/uL (ref 0.0–0.1)
EOS%: 1.5 % (ref 0.0–7.0)
Eosinophils Absolute: 0.1 10*3/uL (ref 0.0–0.5)
HCT: 41.5 % (ref 34.8–46.6)
HEMOGLOBIN: 13.8 g/dL (ref 11.6–15.9)
LYMPH%: 29.3 % (ref 14.0–49.7)
MCH: 32.5 pg (ref 25.1–34.0)
MCHC: 33.3 g/dL (ref 31.5–36.0)
MCV: 97.9 fL (ref 79.5–101.0)
MONO#: 0.4 10*3/uL (ref 0.1–0.9)
MONO%: 5.5 % (ref 0.0–14.0)
NEUT%: 63.3 % (ref 38.4–76.8)
NEUTROS ABS: 5 10*3/uL (ref 1.5–6.5)
Platelets: 247 10*3/uL (ref 145–400)
RBC: 4.24 10*6/uL (ref 3.70–5.45)
RDW: 13.1 % (ref 11.2–14.5)
WBC: 7.9 10*3/uL (ref 3.9–10.3)
lymph#: 2.3 10*3/uL (ref 0.9–3.3)

## 2017-04-26 LAB — COMPREHENSIVE METABOLIC PANEL
ALBUMIN: 3.8 g/dL (ref 3.5–5.0)
ALK PHOS: 74 U/L (ref 40–150)
ALT: 20 U/L (ref 0–55)
AST: 18 U/L (ref 5–34)
Anion Gap: 8 mEq/L (ref 3–11)
BILIRUBIN TOTAL: 0.22 mg/dL (ref 0.20–1.20)
BUN: 11.2 mg/dL (ref 7.0–26.0)
CO2: 27 mEq/L (ref 22–29)
Calcium: 9.6 mg/dL (ref 8.4–10.4)
Chloride: 105 mEq/L (ref 98–109)
Creatinine: 0.8 mg/dL (ref 0.6–1.1)
EGFR: 86 mL/min/{1.73_m2} — ABNORMAL LOW (ref >90)
GLUCOSE: 92 mg/dL (ref 70–140)
POTASSIUM: 3.9 meq/L (ref 3.5–5.1)
SODIUM: 140 meq/L (ref 136–145)
TOTAL PROTEIN: 7 g/dL (ref 6.4–8.3)

## 2017-04-26 NOTE — Telephone Encounter (Signed)
[facility] [address] called for an proair refill. This was originally ordered on 02/24/16 by cyndee bacon np for bronchitis.  It has never been refilled. She was last seen by Dr Jana Hakim 09/28/16, next OV 05/03/17

## 2017-05-03 ENCOUNTER — Ambulatory Visit (HOSPITAL_BASED_OUTPATIENT_CLINIC_OR_DEPARTMENT_OTHER): Payer: BLUE CROSS/BLUE SHIELD | Admitting: Oncology

## 2017-05-03 ENCOUNTER — Encounter: Payer: Self-pay | Admitting: *Deleted

## 2017-05-03 VITALS — BP 154/91 | HR 102 | Temp 98.4°F | Resp 18 | Ht 60.75 in | Wt 190.2 lb

## 2017-05-03 DIAGNOSIS — Z170 Estrogen receptor positive status [ER+]: Secondary | ICD-10-CM | POA: Diagnosis not present

## 2017-05-03 DIAGNOSIS — C50412 Malignant neoplasm of upper-outer quadrant of left female breast: Secondary | ICD-10-CM

## 2017-05-03 NOTE — Progress Notes (Signed)
[place]  [phone] [phone]     ID: Cynthia [name] DOB: 22-Apr-1966  MR#: [id number]  WIO#:[id number]  Patient Care Team: Cynthia [name]., MD as PCP - General (Internal Medicine) Cynthia Bookbinder, MD as Consulting Physician (General Surgery) Cynthia [name], Cynthia Dad, MD as Consulting Physician (Oncology) Cynthia Rudd, MD as Consulting Physician (Radiation Oncology) Cynthia Kaufmann, RN as Registered Nurse Cynthia Germany, RN as Registered Nurse Cynthia Bookbinder, MD as Consulting Physician (Dermatology) Cynthia Eagle, MD as Referring Physician (General Practice) PCP: Cynthia [name]., MD OTHER MD: Cynthia [name] M.D.  CHIEF COMPLAINT: Triple positive breast cancer  CURRENT TREATMENT:  tamoxifen  BREAST CANCER HISTORY: From the original intake note:  Cynthia [name] had noted a change in her left breast on self-exam the last week in March. She brought it to Dr. Trellis Paganini attention and on 04/13/2015 underwent diagnostic bilateral mammography and left ultrasonography at [facility]. This found the breast composition to be category D. An area of architectural distortion was noted in the left breast upper outer quadrant correlating to the palpable mass. By ultrasound there was a 2 cm lobulated mass in the left breast upper outer quadrant which was hypoechoic. There was a 4 mm nodule immediately adjacent to this felt to be a satellite lesion.  Biopsy of the left breast mass in question 04/14/2015 showed ([id number]) invasive ductal carcinoma, grade 2, estrogen receptor 90% positive, progesterone receptor 90% positive, with an MIB-1 of 20%, and HER-2 amplified, the signals ratio being 5.36.  The patient's subsequent history is as detailed below  INTERVAL HISTORY: Cynthia [name] returns today for follow-up of her estrogen receptor positive breast cancer. She continues on tamoxifen, generally with good tolerance. Hot flashes and vaginal wetness are [name] issues. She obtains a drug at a good  price.  Since her last visit here she had a terrible fall from a hunting blind, dropping about 10 feet, fracturing her right clavicle and several ribs. She had to be admitted to [facility] for several days and underwent screw fixation of the right clavicle and the second right rib. She has made a wonderful recovery, with good range of motion, no residuals that she is aware of and no chronic pain issues. She is planning to go on a fishing trip in the near future  She tells me she has not smoked in the last several months. She did use a "vapor cigarette" for some time. She is now not doing that.  REVIEW OF SYSTEMS: Cynthia [name] is concerned about weight gain. She is very active but does feel hungry very frequently. She is having no periods. A detailed review of systems today was otherwise noncontributory  PAST MEDICAL HISTORY: Past Medical History:  Diagnosis Date  . Anxiety   . Breast cancer ([place]) 03/2015   IDC of UOQ of left breast; ER/PR+, Her2+  . Breast cancer of upper-outer quadrant of left female breast ([place]) 04/16/2015  . Hot flashes   . Skin cancer    Hx of melanoma at 39y; multiple basal cell carcinomas first dx in late 66s   melanoma, removed in [place]   PAST SURGICAL HISTORY: Past Surgical History:  Procedure Laterality Date  . BLADDER SUSPENSION  2009  . DILATION AND CURETTAGE OF UTERUS    . dnc    . PORTACATH PLACEMENT Right 04/27/2015   Procedure: INSERTION PORT-A-CATH;  Surgeon: Cynthia Bookbinder, MD;  Location: [place];  Service: General;  Laterality: Right;  . RADIOACTIVE SEED GUIDED MASTECTOMY WITH AXILLARY  SENTINEL LYMPH NODE BIOPSY Left 09/21/2015   Procedure: RADIOACTIVE SEED GUIDED LUMPECTOMY WITH AXILLARY SENTINEL LYMPH NODE BIOPSY;  Surgeon: Cynthia Bookbinder, MD;  Location: [facility];  Service: General;  Laterality: Left;    FAMILY HISTORY Family History  Problem Relation Age of Onset  . Breast cancer Maternal Aunt        dx.  44s  . Lung cancer Maternal Grandmother        dx. late 50s-early 60s; "metastasis to breast"; smoker  . Lung cancer Paternal Grandfather        dx. 25s; smokeless tobacco user  . Other Mother        hx of TAH  . Colon polyps Father        unspecified amt  . Melanoma Maternal Uncle 64  . Heart Problems Paternal Grandmother   . Diabetes Paternal Grandmother   . Melanoma Other 47   the patient's parents are still living as of April 2016. They are divorced. The patient is an only child. The patient's paternal grandfather and maternal grandmother had a history of lung cancer. On the mother's side there is a history of melanoma. The patient's mother's only sister was diagnosed with breast cancer in her 55s. There is no history of ovarian cancer in the family.   GYNECOLOGIC HISTORY:  No LMP recorded.  menarche age 34, first live birth age 66, the patient is GX P3. Her periods are ongoing but irregular. She has a copper IUD in place for contraception. She took oral contraceptives for approximately 15 years in the past with no complications.   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 She is single, and lives at home with her father, who is disabled and has a history of depression. At home also is the patient's daughter Cynthia [name] "Cynthia [name], 52 years old as of April 2016. The patient's 2 other daughters are Cynthia [name], lives in [place] and is a physical therapy technician, and Cynthia [name], lives in [facility] and works in Press photographer.     ADVANCED DIRECTIVES:  not in place.    HEALTH MAINTENANCE: Social History  Substance Use Topics  . Smoking status: Never Smoker  . Smokeless tobacco: Never Used  . Alcohol use Yes     Comment: occasion drink; only smoked cigs socially while in college     Colonoscopy: 2017   PAP:  Bone density: never   Lipid panel:  Allergies  Allergen Reactions  . Zofran [Ondansetron Hcl] Other (See Comments)    headache  .  Adhesive [Tape] Rash    Steri strip    Current Outpatient Prescriptions  Medication Sig Dispense Refill  . albuterol (PROVENTIL HFA;VENTOLIN HFA) 108 (90 Base) MCG/ACT inhaler Inhale 1-2 puffs into the lungs every 6 (six) hours as needed for wheezing or shortness of breath. 1 Inhaler 2  . ALPRAZolam (XANAX) 1 MG tablet Reported on 03/16/2016    . conjugated estrogens (PREMARIN) vaginal cream Place 1 Applicatorful vaginally daily. 42.5 g 12  . [facility] MELATONIN 3 MG TABS Take 1 tablet by mouth at bedtime as needed.  5  . gabapentin (NEURONTIN) 300 MG capsule Take 1 capsule (300 mg total) by mouth at bedtime. 90 capsule 4  . loratadine (CLARITIN) 10 MG tablet TAKE 1 TABLET BY MOUTH DAILY 90 tablet 2  . MAGNESIUM PO Take 1 tablet by mouth daily.    . Multiple Vitamins-Minerals (CENTRUM SILVER PO) Take by mouth. Reported on 01/20/2016    .  tamoxifen (NOLVADEX) 20 MG tablet Take 1 tablet (20 mg total) by mouth daily. 90 tablet 3  . valACYclovir (VALTREX) 500 MG tablet TAKE 1 TABLET (500 MG TOTAL) BY MOUTH 2 (TWO) TIMES DAILY. 60 tablet 1  . venlafaxine XR (EFFEXOR-XR) 75 MG 24 hr capsule TAKE 2 CAPSULES BY MOUTH DAILY AT BEDTIME. 180 capsule 2   No current facility-administered medications for this visit.     OBJECTIVE: middle-aged white woman in no acute distress Vitals:   05/03/17 1317  BP: (!) 154/91  Pulse: (!) 102  Resp: 18  Temp: 98.4 F (36.9 C)     Body mass index is 36.23 kg/m.    ECOG FS:0 - Asymptomatic  Sclerae unicteric, pupils round and equal Oropharynx clear and moist No cervical or supraclavicular adenopathy Lungs no rales or rhonchi Heart regular rate and rhythm Abd soft, nontender, positive bowel sounds MSK no focal spinal tenderness, no upper extremity lymphedema, good range of motion right upper extremity, no significant right clavicular tenderness Neuro: nonfocal, well oriented, appropriate affect Breasts: The right breast is benign. The left breast is status post  lumpectomy and radiation, with no evidence of local recurrence. Both axillae are benign.  LAB RESULTS:  CMP     Component Value Date/Time   NA 140 04/26/2017 1308   K 3.9 04/26/2017 1308   CO2 27 04/26/2017 1308   GLUCOSE 92 04/26/2017 1308   BUN 11.2 04/26/2017 1308   CREATININE 0.8 04/26/2017 1308   CALCIUM 9.6 04/26/2017 1308   PROT 7.0 04/26/2017 1308   ALBUMIN 3.8 04/26/2017 1308   AST 18 04/26/2017 1308   ALT 20 04/26/2017 1308   ALKPHOS 74 04/26/2017 1308   BILITOT 0.22 04/26/2017 1308    INo results found for: SPEP, UPEP  Lab Results  Component Value Date   WBC 7.9 04/26/2017   NEUTROABS 5.0 04/26/2017   HGB 13.8 04/26/2017   HCT 41.5 04/26/2017   MCV 97.9 04/26/2017   PLT 247 04/26/2017      Chemistry      Component Value Date/Time   NA 140 04/26/2017 1308   K 3.9 04/26/2017 1308   CO2 27 04/26/2017 1308   BUN 11.2 04/26/2017 1308   CREATININE 0.8 04/26/2017 1308      Component Value Date/Time   CALCIUM 9.6 04/26/2017 1308   ALKPHOS 74 04/26/2017 1308   AST 18 04/26/2017 1308   ALT 20 04/26/2017 1308   BILITOT 0.22 04/26/2017 1308       No results found for: LABCA2  No components found for: LABCA125  No results for input(s): INR in the last 168 hours.  Urinalysis No results found for: COLORURINE, APPEARANCEUR, LABSPEC, PHURINE, GLUCOSEU, HGBUR, BILIRUBINUR, KETONESUR, PROTEINUR, UROBILINOGEN, NITRITE, LEUKOCYTESUR  STUDIES: Mammography at [facility] ultrasound 04/10/2017 finds right breast upper outer quadrant cysts measuring 0.6 and 0.9 cm. There was no evidence of malignancy.   Films obtained at [facility] included a lumbar spine MRI on 05/01/2017 showing L4-5 spondylosis with disc bulge, and some 4 ML protrusion at L3-4. There is no evidence of cancer.   Right clavicle films May 3 28 finds good alignment of plate and screw fixation of a healed right clavicular fracture. There were remote right rib fractures with screw fixation of the anterior  right second rib.   Bone density 02/02/2017 was normal with a T score of -0.8.   ASSESSMENT: 51 y.o. [place] woman status post left breast Upper outer quadrant biopsy 04/14/2015 for a clinical T2 N0,  stage IIA invasive ductal carcinoma, grade 2, estrogen and progesterone receptor positive, with HER-2 amplified, and an MIB-1 of 20%  (1). 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  (4) radiation 10/27-12/13/16; Left breast/ 45 Gy at 1.8 Gy per fraction x 25 fractions.  Left breast boost/ 16 Gy at 2 Gy per fraction x 8 fractions  (5) tamoxifen started 02/08/2016  (a) bone density at [facility] 02/02/2017 was normal with a T score of -0.8  (6) genetics testing 09/06/2015 through the 8-gene Breast High/Moderate Risk Panel offered [facility] (Hope Pigeon, MD) found no deleterious mutations in ATM, BRCA1, BRCA2, CDH1, CHEK2, PALB2, PTEN, and TP53.   PLAN: Jakiera  is now a year and a half out from definitive surgery for her breast cancer with no evidence of disease recurrence. This is favorable.  She is tolerating tamoxifen well. While on tamoxifen she is able to safely use intravaginal estrogens. For that reason we are not likely to switch to an aromatase inhibitor but may wish to continue tamoxifen for a total of 10 years.  At this point I feel comfortable seeing her on a yearly basis. She knows to call for any  problems that may develop before her next visit here.   Chauncey Cruel, MD   05/03/2017 3:33 PM

## 2017-05-07 ENCOUNTER — Other Ambulatory Visit: Payer: Self-pay

## 2017-05-07 DIAGNOSIS — J40 Bronchitis, not specified as acute or chronic: Secondary | ICD-10-CM

## 2017-05-07 MED ORDER — ALBUTEROL SULFATE HFA 108 (90 BASE) MCG/ACT IN AERS: 1.0000 | INHALATION_SPRAY | Freq: Four times a day (QID) | RESPIRATORY_TRACT | 2 refills | Status: DC | PRN

## 2017-05-29 ENCOUNTER — Other Ambulatory Visit: Payer: Self-pay | Admitting: *Deleted

## 2017-05-29 DIAGNOSIS — C50412 Malignant neoplasm of upper-outer quadrant of left female breast: Secondary | ICD-10-CM

## 2017-05-29 MED ORDER — LORATADINE 10 MG PO TABS: 10.0000 mg | ORAL_TABLET | Freq: Every day | ORAL | 2 refills | Status: AC

## 2017-07-13 ENCOUNTER — Other Ambulatory Visit: Payer: Self-pay

## 2017-07-13 ENCOUNTER — Telehealth: Payer: Self-pay

## 2017-07-13 MED ORDER — VALACYCLOVIR HCL 500 MG PO TABS: 500.0000 mg | ORAL_TABLET | Freq: Two times a day (BID) | ORAL | 1 refills | Status: DC

## 2017-07-13 NOTE — Telephone Encounter (Signed)
[facility] for val[facility]ill. Last refilled 12/08/16 for 2 months. Pt is on tamoxifen. Please verify if valcyclovir does need refilling.

## 2017-07-17 ENCOUNTER — Other Ambulatory Visit: Payer: Self-pay | Admitting: *Deleted

## 2017-09-26 ENCOUNTER — Telehealth: Payer: Self-pay | Admitting: Oncology

## 2017-09-26 NOTE — Telephone Encounter (Signed)
Received [facility]ed [facility] on 09/25/2017. A copy of this is located in [facility] under the Media tab for viewing and printing.

## 2017-09-28 ENCOUNTER — Telehealth: Payer: Self-pay | Admitting: Oncology

## 2017-09-28 NOTE — Telephone Encounter (Signed)
09/26/17 prescription refill  Scanned in media tab

## 2017-10-02 NOTE — Telephone Encounter (Signed)
Refill not viewable 

## 2017-11-02 ENCOUNTER — Other Ambulatory Visit: Payer: Self-pay

## 2017-11-02 ENCOUNTER — Emergency Department (HOSPITAL_BASED_OUTPATIENT_CLINIC_OR_DEPARTMENT_OTHER)
Admission: EM | Admit: 2017-11-02 | Discharge: 2017-11-03 | Disposition: A | Discharge status: Home / Self Care | Payer: BLUE CROSS/BLUE SHIELD | Attending: Emergency Medicine | Admitting: Emergency Medicine

## 2017-11-02 ENCOUNTER — Emergency Department (HOSPITAL_BASED_OUTPATIENT_CLINIC_OR_DEPARTMENT_OTHER): Payer: BLUE CROSS/BLUE SHIELD

## 2017-11-02 ENCOUNTER — Encounter (HOSPITAL_BASED_OUTPATIENT_CLINIC_OR_DEPARTMENT_OTHER): Payer: Self-pay | Admitting: Respiratory Therapy

## 2017-11-02 DIAGNOSIS — M79601 Pain in right arm: Secondary | ICD-10-CM

## 2017-11-02 DIAGNOSIS — Z79899 Other long term (current) drug therapy: Secondary | ICD-10-CM | POA: Insufficient documentation

## 2017-11-02 DIAGNOSIS — R0602 Shortness of breath: Secondary | ICD-10-CM | POA: Diagnosis not present

## 2017-11-02 DIAGNOSIS — M79621 Pain in right upper arm: Secondary | ICD-10-CM | POA: Diagnosis present

## 2017-11-02 HISTORY — DX: Essential (primary) hypertension: I10

## 2017-11-02 LAB — CBC WITH DIFFERENTIAL/PLATELET
BASOS ABS: 0 10*3/uL (ref 0.0–0.1)
BASOS PCT: 0 %
Eosinophils Absolute: 0.3 10*3/uL (ref 0.0–0.7)
Eosinophils Relative: 4 %
HEMATOCRIT: 36.3 % (ref 36.0–46.0)
HEMOGLOBIN: 12.1 g/dL (ref 12.0–15.0)
Lymphocytes Relative: 39 %
Lymphs Abs: 3.4 10*3/uL (ref 0.7–4.0)
MCH: 33.2 pg (ref 26.0–34.0)
MCHC: 33.3 g/dL (ref 30.0–36.0)
MCV: 99.7 fL (ref 78.0–100.0)
MONOS PCT: 7 %
Monocytes Absolute: 0.6 10*3/uL (ref 0.1–1.0)
NEUTROS ABS: 4.5 10*3/uL (ref 1.7–7.7)
NEUTROS PCT: 50 %
Platelets: 193 10*3/uL (ref 150–400)
RBC: 3.64 MIL/uL — ABNORMAL LOW (ref 3.87–5.11)
RDW: 11.7 % (ref 11.5–15.5)
WBC: 8.8 10*3/uL (ref 4.0–10.5)

## 2017-11-02 LAB — BASIC METABOLIC PANEL
ANION GAP: 6 (ref 5–15)
BUN: 17 mg/dL (ref 6–20)
CHLORIDE: 108 mmol/L (ref 101–111)
CO2: 25 mmol/L (ref 22–32)
Calcium: 8.5 mg/dL — ABNORMAL LOW (ref 8.9–10.3)
Creatinine, Ser: 0.72 mg/dL (ref 0.44–1.00)
GFR calc non Af Amer: >60 mL/min (ref >60)
Glucose, Bld: 127 mg/dL — ABNORMAL HIGH (ref 65–99)
Potassium: 3.6 mmol/L (ref 3.5–5.1)
Sodium: 139 mmol/L (ref 135–145)

## 2017-11-02 MED ORDER — IBUPROFEN 800 MG PO TABS
800.0000 mg | ORAL_TABLET | Freq: Once | ORAL | Status: AC
  Administered 2017-11-02: 800 mg via ORAL
  Filled 2017-11-02: qty 1

## 2017-11-02 NOTE — ED Notes (Signed)
ED Provider at bedside. 

## 2017-11-02 NOTE — ED Triage Notes (Signed)
Pt c/o aching pain to right arm and pain between shoulder blades. Pt reports some shob. Pt reports pain is worse with movement

## 2017-11-03 ENCOUNTER — Emergency Department (HOSPITAL_BASED_OUTPATIENT_CLINIC_OR_DEPARTMENT_OTHER): Payer: BLUE CROSS/BLUE SHIELD

## 2017-11-03 ENCOUNTER — Encounter (HOSPITAL_BASED_OUTPATIENT_CLINIC_OR_DEPARTMENT_OTHER): Payer: Self-pay | Admitting: Emergency Medicine

## 2017-11-03 LAB — TROPONIN I: Troponin I: <0.03 ng/mL (ref <0.03)

## 2017-11-03 MED ORDER — DICLOFENAC SODIUM ER 100 MG PO TB24: 100.0000 mg | ORAL_TABLET | Freq: Every day | ORAL | 0 refills | Status: DC

## 2017-11-03 MED ORDER — IOPAMIDOL (ISOVUE-370) INJECTION 76%
100.0000 mL | Freq: Once | INTRAVENOUS | Status: AC | PRN
  Administered 2017-11-03: 100 mL via INTRAVENOUS

## 2017-11-03 NOTE — ED Notes (Signed)
ED Provider at bedside discussing test results and dispo plan of care. 

## 2017-11-03 NOTE — ED Provider Notes (Signed)
[facility] EMERGENCY DEPARTMENT Provider Note   CSN: [id number] Arrival date & time: 11/02/17  2244     History   Chief Complaint Chief Complaint  Patient presents with  . Arm Pain    HPI Cynthia [name] is a 51 y.o. female.  The history is provided by the patient.  Extremity Pain  This is a new problem. The current episode started yesterday. The problem occurs constantly. The problem has not changed since onset.Pertinent negatives include no chest pain, no abdominal pain, no headaches and no shortness of breath. Nothing aggravates the symptoms. Nothing relieves the symptoms. She has tried nothing for the symptoms. The treatment provided no relief.  Right pinky finger pain that radiates into the right shoulder and has pain in the posterior shoulder as well.  Does heavy lifting at work.  Was in an accident 2 weeks ago and thinks it may be related and did not have xrays at the time.    Past Medical History:  Diagnosis Date  . Anxiety   . Breast cancer ([place]) 03/2015   IDC of UOQ of left breast; ER/PR+, Her2+  . Breast cancer of upper-outer quadrant of left female breast ([place]) 04/16/2015  . Hot flashes   . Hypertension   . Skin cancer    Hx of melanoma at 27y; multiple basal cell carcinomas first dx in late 29s    Patient Active Problem List   Diagnosis Date Noted  . Bronchitis 02/24/2016  . Hearing loss associated with syndrome of both ears 12/02/2015  . Genetic testing 09/06/2015  . History of skin cancer 08/24/2015  . Excessive tearing 08/05/2015  . Hot flashes 07/15/2015  . Drug-induced skin rash 05/15/2015  . Rash 05/12/2015  . Dehydration 05/12/2015  . Esophagitis 05/12/2015  . Migraines 04/21/2015  . Malignant neoplasm of upper-outer quadrant of left breast in female, estrogen receptor positive ([place]) 04/16/2015    Past Surgical History:  Procedure Laterality Date  . BLADDER SUSPENSION  2009  . DILATION AND CURETTAGE OF UTERUS    . dnc      OB  History    No data available       Home Medications    Prior to Admission medications   Medication Sig Start Date End Date Taking? Authorizing Provider  albuterol (PROVENTIL HFA;VENTOLIN HFA) 108 (90 Base) MCG/ACT inhaler Inhale 1-2 puffs into the lungs every 6 (six) hours as needed for wheezing or shortness of breath. 05/07/17   Magrinat, Virgie Dad, MD  ALPRAZolam Duanne Moron) 1 MG tablet Reported on 03/16/2016 04/16/15   [provider]  conjugated estrogens (PREMARIN) vaginal cream Place 1 Applicatorful vaginally daily. 11/24/16   Magrinat, Virgie Dad, MD  [facility] MELATONIN 3 MG TABS Take 1 tablet by mouth at bedtime as needed. 02/13/16   [provider]  gabapentin (NEURONTIN) 300 MG capsule Take 1 capsule (300 mg total) by mouth at bedtime. 01/12/16   Magrinat, Virgie Dad, MD  loratadine (CLARITIN) 10 MG tablet Take 1 tablet (10 mg total) by mouth daily. 05/29/17   Magrinat, Virgie Dad, MD  MAGNESIUM PO Take 1 tablet by mouth daily.    [provider]  Multiple Vitamins-Minerals (CENTRUM SILVER PO) Take by mouth. Reported on 01/20/2016    [provider]  tamoxifen (NOLVADEX) 20 MG tablet Take 1 tablet (20 mg total) by mouth daily. 04/02/17   Magrinat, Virgie Dad, MD  valACYclovir (VALTREX) 500 MG tablet Take 1 tablet (500 mg total) by mouth 2 (two) times daily. 07/13/17  Magrinat, Virgie Dad, MD  venlafaxine XR (EFFEXOR-XR) 75 MG 24 hr capsule TAKE 2 CAPSULES BY MOUTH DAILY AT BEDTIME. 11/29/16   Magrinat, Virgie Dad, MD    Family History Family History  Problem Relation Age of Onset  . Breast cancer Maternal Aunt        dx. 13s  . Lung cancer Maternal Grandmother        dx. late 50s-early 60s; "metastasis to breast"; smoker  . Lung cancer Paternal Grandfather        dx. 54s; smokeless tobacco user  . Other Mother        hx of TAH  . Colon polyps Father        unspecified amt  . Melanoma Maternal Uncle 64  . Heart Problems Paternal Grandmother   . Diabetes Paternal  Grandmother   . Melanoma Other 29    Social History Social History   Tobacco Use  . Smoking status: Never Smoker  . Smokeless tobacco: Never Used  Substance Use Topics  . Alcohol use: Yes    Comment: occasion drink; only smoked cigs socially while in college  . Drug use: No     Allergies   Zofran [ondansetron hcl] and Adhesive [tape]   Review of Systems Review of Systems  Respiratory: Negative for shortness of breath.   Cardiovascular: Negative for chest pain, palpitations and leg swelling.  Gastrointestinal: Negative for abdominal pain.  Musculoskeletal: Positive for arthralgias. Negative for back pain, joint swelling, neck pain and neck stiffness.  Neurological: Negative for headaches.  All other systems reviewed and are negative.    Physical Exam Updated Vital Signs BP 109/62 (BP Location: Right Arm)   Pulse 86   Temp 97.6 F (36.4 C) (Oral)   Resp 16   Ht _0  (1.727 m)   Wt 81.6 kg (180 lb)   SpO2 99%   BMI 27.37 kg/m   Physical Exam  Constitutional: She is oriented to person, place, and time. She appears well-developed and well-nourished. No distress.  HENT:  Head: Normocephalic and atraumatic.  Mouth/Throat: No oropharyngeal exudate.  Eyes: Conjunctivae are normal. Pupils are equal, round, and reactive to light.  Neck: Normal range of motion. Neck supple.  Cardiovascular: Normal rate, regular rhythm, normal heart sounds and intact distal pulses.  Pulmonary/Chest: Effort normal and breath sounds normal. No stridor. No respiratory distress. She has no wheezes. She has no rales.  Abdominal: Soft. Bowel sounds are normal. She exhibits no mass. There is no tenderness. There is no rebound and no guarding.  Musculoskeletal: Normal range of motion. She exhibits no edema or deformity.       Right shoulder: She exhibits tenderness. She exhibits normal range of motion, no bony tenderness, no swelling, no effusion, no crepitus, no deformity, no laceration, no  spasm, normal pulse and normal strength.       Right elbow: Normal.      Right wrist: Normal.       Cervical back: Normal.       Right upper arm: Normal.       Right forearm: Normal.       Right hand: Normal. She exhibits normal range of motion, no bony tenderness, normal capillary refill and no swelling. Normal sensation noted. Normal strength noted.  Negative neers test   Neurological: She is alert and oriented to person, place, and time.  Skin: Skin is warm and dry. Capillary refill takes less than 2 seconds.  Psychiatric: She has a normal mood and affect.  ED Treatments / Results  Labs (all labs ordered are listed, but only abnormal results are displayed) Results for orders placed or performed during the hospital encounter of 11/02/17  CBC with Differential/Platelet  Result Value Ref Range   WBC 8.8 4.0 - 10.5 K/uL   RBC 3.64 (L) 3.87 - 5.11 MIL/uL   Hemoglobin 12.1 12.0 - 15.0 g/dL   HCT 36.3 36.0 - 46.0 %   MCV 99.7 78.0 - 100.0 fL   MCH 33.2 26.0 - 34.0 pg   MCHC 33.3 30.0 - 36.0 g/dL   RDW 11.7 11.5 - 15.5 %   Platelets 193 150 - 400 K/uL   Neutrophils Relative % 50 %   Neutro Abs 4.5 1.7 - 7.7 K/uL   Lymphocytes Relative 39 %   Lymphs Abs 3.4 0.7 - 4.0 K/uL   Monocytes Relative 7 %   Monocytes Absolute 0.6 0.1 - 1.0 K/uL   Eosinophils Relative 4 %   Eosinophils Absolute 0.3 0.0 - 0.7 K/uL   Basophils Relative 0 %   Basophils Absolute 0.0 0.0 - 0.1 K/uL  Basic metabolic panel  Result Value Ref Range   Sodium 139 135 - 145 mmol/L   Potassium 3.6 3.5 - 5.1 mmol/L   Chloride 108 101 - 111 mmol/L   CO2 25 22 - 32 mmol/L   Glucose, Bld 127 (H) 65 - 99 mg/dL   BUN 17 6 - 20 mg/dL   Creatinine, Ser 0.72 0.44 - 1.00 mg/dL   Calcium 8.5 (L) 8.9 - 10.3 mg/dL   GFR calc non Af Amer >60 >60 mL/min   GFR calc Af Amer >60 >60 mL/min   Anion gap 6 5 - 15  Troponin I  Result Value Ref Range   Troponin I <0.03 <0.03 ng/mL   Dg Cervical Spine Complete  Result Date:  11/03/2017 CLINICAL DATA:  Acute onset of pain between the scapula, with right arm aching. Shortness of breath. Initial encounter. EXAM: CERVICAL SPINE - COMPLETE 4+ VIEW COMPARISON:  None. FINDINGS: There is no evidence of fracture or subluxation. An anterior disc osteophyte complex is noted at C5-C6. Vertebral bodies demonstrate normal height and alignment. Intervertebral disc spaces are preserved. Prevertebral soft tissues are within normal limits. The provided odontoid view demonstrates no significant abnormality. The visualized lung apices are clear. IMPRESSION: No evidence of fracture or subluxation along the cervical spine. Electronically Signed   By: Garald Balding M.D.   On: 11/03/2017 00:24   Ct Angio Chest Pe W And/or Wo Contrast  Result Date: 11/03/2017 CLINICAL DATA:  Acute onset of right arm pain and pain between the scapula. Shortness of breath. EXAM: CT ANGIOGRAPHY CHEST WITH CONTRAST TECHNIQUE: Multidetector CT imaging of the chest was performed using the standard protocol during bolus administration of intravenous contrast. Multiplanar CT image reconstructions and MIPs were obtained to evaluate the vascular anatomy. CONTRAST:  165m ISOVUE-370 IOPAMIDOL (ISOVUE-370) INJECTION 76% COMPARISON:  Chest radiograph performed 04/27/2015 FINDINGS: Cardiovascular:  There is no evidence of pulmonary embolus. The heart is normal in size. The thoracic aorta is unremarkable the great vessels are within normal limits. Mediastinum/Nodes: The mediastinum is unremarkable in appearance. No mediastinal lymphadenopathy is seen. No pericardial effusion is identified. Right hilar nodes remain normal in size. Small hypodensities within the right thyroid lobe are likely benign, given their size. No axillary lymphadenopathy is seen. Lungs/Pleura: Right basilar scarring is noted. A calcified granuloma is noted at the right lung base. No pleural effusion or pneumothorax is seen. No masses  are identified. Upper Abdomen:  The visualized portions of the liver and spleen are unremarkable. Musculoskeletal: No acute osseous abnormalities are identified. 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 This The visualized musculature is unremarkable in appearance. Review of the MIP images confirms the above findings. IMPRESSION: 1. No evidence of pulmonary embolus. 2. Right basilar scarring.  Lungs otherwise clear. 3. Multiple chronic right-sided rib deformities noted. Electronically Signed   By: Garald Balding M.D.   On: 11/03/2017 00:53    EKG  EKG Interpretation  Date/Time:  Friday November 02 2017 22:53:31 EST Ventricular Rate:  94 PR Interval:    QRS Duration: 92 QT Interval:  383 QTC Calculation: 479 R Axis:   34 Text Interpretation:  Sinus rhythm Probable left atrial enlargement Borderline T abnormalities, anterior leads Confirmed by Randal Buba, Michaelann Gunnoe ([id number]) on 11/02/2017 11:03:21 PM       Radiology Dg Cervical Spine Complete  Result Date: 11/03/2017 CLINICAL DATA:  Acute onset of pain between the scapula, with right arm aching. Shortness of breath. Initial encounter. EXAM: CERVICAL SPINE - COMPLETE 4+ VIEW COMPARISON:  None. FINDINGS: There is no evidence of fracture or subluxation. An anterior disc osteophyte complex is noted at C5-C6. Vertebral bodies demonstrate normal height and alignment. Intervertebral disc spaces are preserved. Prevertebral soft tissues are within normal limits. The provided odontoid view demonstrates no significant abnormality. The visualized lung apices are clear. IMPRESSION: No evidence of fracture or subluxation along the cervical spine. Electronically Signed   By: Garald Balding M.D.   On: 11/03/2017 00:24   Ct Angio Chest Pe W And/or Wo Contrast  Result Date: 11/03/2017 CLINICAL DATA:  Acute onset of right arm pain and pain between the scapula. Shortness of breath. EXAM: CT ANGIOGRAPHY CHEST WITH CONTRAST TECHNIQUE: Multidetector CT imaging of the chest was performed  using the standard protocol during bolus administration of intravenous contrast. Multiplanar CT image reconstructions and MIPs were obtained to evaluate the vascular anatomy. CONTRAST:  133m ISOVUE-370 IOPAMIDOL (ISOVUE-370) INJECTION 76% COMPARISON:  Chest radiograph performed 04/27/2015 FINDINGS: Cardiovascular:  There is no evidence of pulmonary embolus. The heart is normal in size. The thoracic aorta is unremarkable the great vessels are within normal limits. Mediastinum/Nodes: The mediastinum is unremarkable in appearance. No mediastinal lymphadenopathy is seen. No pericardial effusion is identified. Right hilar nodes remain normal in size. Small hypodensities within the right thyroid lobe are likely benign, given their size. No axillary lymphadenopathy is seen. Lungs/Pleura: Right basilar scarring is noted. A calcified granuloma is noted at the right lung base. No pleural effusion or pneumothorax is seen. No masses are identified. Upper Abdomen: The visualized portions of the liver and spleen are unremarkable. Musculoskeletal: No acute osseous abnormalities are identified. 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 This The visualized musculature is unremarkable in appearance. Review of the MIP images confirms the above findings. IMPRESSION: 1. No evidence of pulmonary embolus. 2. Right basilar scarring.  Lungs otherwise clear. 3. Multiple chronic right-sided rib deformities noted. Electronically Signed   By: JGarald BaldingM.D.   On: 11/03/2017 00:53    Procedures Procedures (including critical care time)  Medications Ordered in ED Medications  ibuprofen (ADVIL,MOTRIN) tablet 800 mg (800 mg Oral Given 11/02/17 2330)  iopamidol (ISOVUE-370) 76 % injection 100 mL (100 mLs Intravenous Contrast Given 11/03/17 0019)       Final Clinical Impressions(s) / ED Diagnoses  Ruled out for an MI and PE in the ED.  Pain is MSK in nature.  RICE therapy and  follow up with your PMD and sports  medicine for ongoing symptoms.    All questions answered to the patient's satisfaction.   Strict return precautions for swelling or the lips or tongue, chest pain, dyspnea on exertion, new weakness or numbness changes in vision or speech, fevers, weakness persistent pain, Inability to tolerate liquids or food, changes in voice cough, altered mental status or any concerns. No signs of systemic illness or infection. The patient is nontoxic-appearing on exam and vital signs are within normal limits.    I have reviewed the triage vital signs and the nursing notes. Pertinent labs &imaging results that were available during my care of the patient were reviewed by me and considered in my medical decision making (see chart for details).  After history, exam, and medical workup I feel the patient has been appropriately medically screened and is safe for discharge home. Pertinent diagnoses were discussed with the patient. Patient was given return precautions.   Yavonne Kiss, MD 11/03/17 [phone]

## 2018-01-29 NOTE — Telephone Encounter (Signed)
a 

## 2018-03-15 ENCOUNTER — Other Ambulatory Visit: Payer: Self-pay | Admitting: *Deleted

## 2018-03-15 MED ORDER — TAMOXIFEN CITRATE 20 MG PO TABS: 20.0000 mg | ORAL_TABLET | Freq: Every day | ORAL | 3 refills | Status: DC

## 2018-05-02 ENCOUNTER — Other Ambulatory Visit: Payer: BLUE CROSS/BLUE SHIELD

## 2018-05-09 ENCOUNTER — Ambulatory Visit: Payer: BLUE CROSS/BLUE SHIELD | Admitting: Oncology

## 2018-08-21 NOTE — Progress Notes (Signed)
[facility]  [phone] [phone]     ID: Jameya Pontiff DOB: Sep 30, 1966  MR#: [id number]  GYB#:[id number]  Patient Care Team: Karleen Hampshire., MD as PCP - General (Internal Medicine) Rolm Bookbinder, MD as Consulting Physician (General Surgery) Magrinat, Virgie Dad, MD as Consulting Physician (Oncology) Kyung Rudd, MD as Consulting Physician (Radiation Oncology) Mauro Kaufmann, RN as Registered Nurse Rockwell Germany, RN as Registered Nurse Rolm Bookbinder, MD as Consulting Physician (Dermatology) Val Eagle, MD as Referring Physician (General Practice) PCP: Karleen Hampshire., MD OTHER MD: Christene Slates M.D.  CHIEF COMPLAINT: Triple positive breast cancer  CURRENT TREATMENT:  tamoxifen  BREAST CANCER HISTORY: From the original intake note:  [name] had noted a change in her left breast on self-exam the last week in March. She brought it to Dr. Trellis Paganini attention and on 04/13/2015 underwent diagnostic bilateral mammography and left ultrasonography at [facility]. This found the breast composition to be category D. An area of architectural distortion was noted in the left breast upper outer quadrant correlating to the palpable mass. By ultrasound there was a 2 cm lobulated mass in the left breast upper outer quadrant which was hypoechoic. There was a 4 mm nodule immediately adjacent to this felt to be a satellite lesion.  Biopsy of the left breast mass in question 04/14/2015 showed ([id number]) invasive ductal carcinoma, grade 2, estrogen receptor 90% positive, progesterone receptor 90% positive, with an MIB-1 of 20%, and HER-2 amplified, the signals ratio being 5.36.  The patient's subsequent history is as detailed below  INTERVAL HISTORY: Caileigh returns today for follow-up of her estrogen receptor positive breast cancer. She continues on tamoxifen, with good tolerance. She has frequent hot flashes. She denies issues with increased vaginal discharge. She takes  venlafaxine, 75 mg 3 times per day.   She had mammography at [facility] today, with results pending.   REVIEW OF SYSTEMS: Elfida reports that she has low energy and she feels like she is out of breath. 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 She fell out of her deer stand and she has some pain in her right shoulder. She has insomnia and her legs feel restless at night. Her youngest daughter moved to [place]. The patient's dad is living with her and he is in better health. Jenipher fly fishes and hunts with her significant other, but sometimes she wishes that she could tell others when she is tired, without feeling guilty. 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 She denies unusual headaches, visual changes, nausea, vomiting, or dizziness. There has been no unusual cough, phlegm production, or pleurisy. There has been no change in bowel or bladder habits. She denies unexplained fatigue or unexplained weight loss, bleeding, rash, or fever. A detailed review of systems was otherwise stable.    PAST MEDICAL HISTORY: Past Medical History:  Diagnosis Date  . Anxiety   . Breast cancer ([place]) 03/2015   IDC of UOQ of left breast; ER/PR+, Her2+  . Breast cancer of upper-outer quadrant of left female breast ([place]) 04/16/2015  . Hot flashes   . Hypertension   . Skin cancer    Hx of melanoma at 52y; multiple basal cell carcinomas first dx in late 75s   melanoma, removed in [place]   PAST SURGICAL HISTORY: Past Surgical History:  Procedure Laterality Date  . BLADDER SUSPENSION  2009  . DILATION AND CURETTAGE OF  UTERUS    . dnc    . PORTACATH PLACEMENT Right 04/27/2015   Procedure: INSERTION PORT-A-CATH;  Surgeon: Rolm Bookbinder, MD;  Location: [facility];  Service: General;  Laterality: Right;  . RADIOACTIVE SEED GUIDED PARTIAL MASTECTOMY WITH AXILLARY SENTINEL LYMPH NODE BIOPSY Left 09/21/2015   Procedure: RADIOACTIVE SEED GUIDED  LUMPECTOMY WITH AXILLARY SENTINEL LYMPH NODE BIOPSY;  Surgeon: Rolm Bookbinder, MD;  Location: [facility];  Service: General;  Laterality: Left;    FAMILY HISTORY Family History  Problem Relation Age of Onset  . Breast cancer Maternal Aunt        dx. 41s  . Lung cancer Maternal Grandmother        dx. late 50s-early 60s; "metastasis to breast"; smoker  . Lung cancer Paternal Grandfather        dx. 45s; smokeless tobacco user  . Other Mother        hx of TAH  . Colon polyps Father        unspecified amt  . Melanoma Maternal Uncle 64  . Heart Problems Paternal Grandmother   . Diabetes Paternal Grandmother   . Melanoma Other 49   the patient's parents are still living as of April 2016. They are divorced. The patient is an only child. The patient's paternal grandfather and maternal grandmother had a history of lung cancer. On the mother's side there is a history of melanoma. The patient's mother's only sister was diagnosed with breast cancer in her 51s. There is no history of ovarian cancer in the family.   GYNECOLOGIC HISTORY:  No LMP recorded. (Menstrual status: IUD).  menarche age 7, first live birth age 52, the patient is GX P3. Her periods are ongoing but irregular. She has a copper IUD in place for contraception. She took oral contraceptives for approximately 15 years in the past with no complications.   SOCIAL HISTORY: Updated August 2019 Asuzena is now a Music therapist for [employer]. She has a significant other, and lives at home with her father, who is disabled and has a history of depression. At home also is the patient's daughter [name], 51 years old as of April 2016. The patient's 2 other daughters are [name], lives in [place] and is a physical therapy technician, and [name], lives in [facility] and works in Press photographer.     ADVANCED DIRECTIVES:  not in place.    HEALTH MAINTENANCE: Social History   Tobacco Use  . Smoking status: Never  Smoker  . Smokeless tobacco: Never Used  Substance Use Topics  . Alcohol use: Yes    Comment: occasion drink; only smoked cigs socially while in college  . Drug use: No     Colonoscopy: 2017   PAP:  Bone density: 02/02/2017 was normal with a T score of -0.8.  Lipid panel:  Allergies  Allergen Reactions  . Zofran [Ondansetron Hcl] Other (See Comments)    headache  . Adhesive [Tape] Rash    Steri strip    Current Outpatient Medications  Medication Sig Dispense Refill  . albuterol (PROVENTIL HFA;VENTOLIN HFA) 108 (90 Base) MCG/ACT inhaler Inhale 1-2 puffs into the lungs every 6 (six) hours as needed for wheezing or shortness of breath. 1 Inhaler 2  . ALPRAZolam (XANAX) 1 MG tablet Reported on 03/16/2016    . conjugated estrogens (PREMARIN) vaginal cream Place 1 Applicatorful vaginally daily. 42.5 g 12  . [employer] MELATONIN 3 MG TABS Take 1 tablet by mouth at bedtime as  needed.  5  . Diclofenac Sodium CR (VOLTAREN-XR) 100 MG 24 hr tablet Take 1 tablet (100 mg total) daily by mouth. 10 tablet 0  . gabapentin (NEURONTIN) 300 MG capsule Take 1 capsule (300 mg total) by mouth at bedtime. 90 capsule 4  . loratadine (CLARITIN) 10 MG tablet Take 1 tablet (10 mg total) by mouth daily. 90 tablet 2  . MAGNESIUM PO Take 1 tablet by mouth daily.    . Multiple Vitamins-Minerals (CENTRUM SILVER PO) Take by mouth. Reported on 01/20/2016    . tamoxifen (NOLVADEX) 20 MG tablet Take 1 tablet (20 mg total) by mouth daily. 90 tablet 3  . valACYclovir (VALTREX) 500 MG tablet Take 1 tablet (500 mg total) by mouth 2 (two) times daily. 60 tablet 1  . venlafaxine XR (EFFEXOR-XR) 150 MG 24 hr capsule Take 1 capsule (150 mg total) by mouth daily with breakfast. 90 capsule 4   No current facility-administered medications for this visit.     OBJECTIVE: middle-aged white woman who appears stated age  28:   08/22/18 1100  BP: 124/72  Pulse: 85  Resp: 18  Temp: 98.2 F (36.8 C)  SpO2: 98%     Body mass  index is 29.71 kg/m.    ECOG FS:1 - Symptomatic but completely ambulatory  Sclerae unicteric, EOMs intact Oropharynx clear and moist No cervical or supraclavicular adenopathy Lungs no rales or rhonchi Heart regular rate and rhythm Abd soft, nontender, positive bowel sounds MSK no focal spinal tenderness, no upper extremity lymphedema Neuro: nonfocal, well oriented, appropriate affect Breasts: The right breast is unremarkable.  The left breast has undergone lumpectomy followed by radiation.  There is no evidence of local recurrence.  Both axillae are benign  LAB RESULTS:  CMP     Component Value Date/Time   NA 140 08/22/2018 1024   NA 140 04/26/2017 1308   K 4.8 08/22/2018 1024   K 3.9 04/26/2017 1308   CL 107 08/22/2018 1024   CO2 27 08/22/2018 1024   CO2 27 04/26/2017 1308   GLUCOSE 100 (H) 08/22/2018 1024   GLUCOSE 92 04/26/2017 1308   BUN 15 08/22/2018 1024   BUN 11.2 04/26/2017 1308   CREATININE 0.78 08/22/2018 1024   CREATININE 0.8 04/26/2017 1308   CALCIUM 9.0 08/22/2018 1024   CALCIUM 9.6 04/26/2017 1308   PROT 6.6 08/22/2018 1024   PROT 7.0 04/26/2017 1308   ALBUMIN 3.5 08/22/2018 1024   ALBUMIN 3.8 04/26/2017 1308   AST 18 08/22/2018 1024   AST 18 04/26/2017 1308   ALT 18 08/22/2018 1024   ALT 20 04/26/2017 1308   ALKPHOS 64 08/22/2018 1024   ALKPHOS 74 04/26/2017 1308   BILITOT 0.2 (L) 08/22/2018 1024   BILITOT 0.22 04/26/2017 1308   GFRNONAA >60 08/22/2018 1024   GFRAA >60 08/22/2018 1024    INo results found for: SPEP, UPEP  Lab Results  Component Value Date   WBC 6.7 08/22/2018   NEUTROABS 3.9 08/22/2018   HGB 13.3 08/22/2018   HCT 39.7 08/22/2018   MCV 97.0 08/22/2018   PLT 202 08/22/2018      Chemistry      Component Value Date/Time   NA 140 08/22/2018 1024   NA 140 04/26/2017 1308   K 4.8 08/22/2018 1024   K 3.9 04/26/2017 1308   CL 107 08/22/2018 1024   CO2 27 08/22/2018 1024   CO2 27 04/26/2017 1308   BUN 15 08/22/2018 1024    BUN 11.2 04/26/2017 1308  CREATININE 0.78 08/22/2018 1024   CREATININE 0.8 04/26/2017 1308      Component Value Date/Time   CALCIUM 9.0 08/22/2018 1024   CALCIUM 9.6 04/26/2017 1308   ALKPHOS 64 08/22/2018 1024   ALKPHOS 74 04/26/2017 1308   AST 18 08/22/2018 1024   AST 18 04/26/2017 1308   ALT 18 08/22/2018 1024   ALT 20 04/26/2017 1308   BILITOT 0.2 (L) 08/22/2018 1024   BILITOT 0.22 04/26/2017 1308       No results found for: LABCA2  No components found for: LABCA125  No results for input(s): INR in the last 168 hours.  Urinalysis No results found for: COLORURINE, APPEARANCEUR, LABSPEC, PHURINE, GLUCOSEU, HGBUR, BILIRUBINUR, KETONESUR, PROTEINUR, UROBILINOGEN, NITRITE, LEUKOCYTESUR  STUDIES:  ASSESSMENT: 52 y.o. [place] woman status post left breast Upper outer quadrant biopsy 04/14/2015 for a clinical T2 N0, stage IIA invasive ductal carcinoma, grade 2, estrogen and progesterone receptor positive, with HER-2 amplified, and an MIB-1 of 20%  (1). 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  (4) radiation 10/27-12/13/16; Left breast/ 45 Gy at 1.8 Gy per fraction x 25 fractions.  Left breast boost/ 16 Gy at 2 Gy per fraction x 8 fractions  (5) tamoxifen started 02/08/2016  (a) bone density at [facility] 02/02/2017 was normal with a T score of -0.8  (6) genetics testing 09/06/2015 through the  8-gene Breast High/Moderate Risk Panel offered [facility] (Hope Pigeon, MD) found no deleterious mutations in ATM, BRCA1, BRCA2, CDH1, CHEK2, PALB2, PTEN, and TP53.   PLAN: Michelyn is now 3 years out from definitive surgery for her breast cancer with no evidence of disease recurrence.  This is very favorable.  She is tolerating tamoxifen well and the plan will be to continue that a minimum of 5 years.  She understands that when she goes off tamoxifen we may have to discontinue the vaginal estrogen since we do not have any data regarding safety of that treatment in breast cancer patients who are not taking tamoxifen.  She is experiencing multiple symptoms of menopause.  This is entirely normal.  I suggested more exercise and more water, but actually most of what she is feeling is expected for her age.  She will return to see me in a year.  She knows to call for any issues that may develop before the next visit.     Magrinat, Virgie Dad, MD  08/22/18 11:33 AM Medical Oncology and Hematology [facility] [address] [place],  [address] Tel. [phone]    Fax. [phone]  Alice Rieger, am acting as scribe for Chauncey Cruel MD.  I, Lurline Del MD, have reviewed the above documentation for accuracy and completeness, and I agree with the above.

## 2018-08-22 ENCOUNTER — Encounter: Payer: Self-pay | Admitting: Oncology

## 2018-08-22 ENCOUNTER — Inpatient Hospital Stay: Payer: 59 | Attending: Oncology

## 2018-08-22 ENCOUNTER — Inpatient Hospital Stay (HOSPITAL_BASED_OUTPATIENT_CLINIC_OR_DEPARTMENT_OTHER): Payer: 59 | Admitting: Oncology

## 2018-08-22 VITALS — BP 124/72 | HR 85 | Temp 98.2°F | Resp 18 | Ht 68.0 in | Wt 195.4 lb

## 2018-08-22 DIAGNOSIS — Z79899 Other long term (current) drug therapy: Secondary | ICD-10-CM | POA: Insufficient documentation

## 2018-08-22 DIAGNOSIS — C50412 Malignant neoplasm of upper-outer quadrant of left female breast: Secondary | ICD-10-CM

## 2018-08-22 DIAGNOSIS — F419 Anxiety disorder, unspecified: Secondary | ICD-10-CM | POA: Insufficient documentation

## 2018-08-22 DIAGNOSIS — Z170 Estrogen receptor positive status [ER+]: Secondary | ICD-10-CM | POA: Diagnosis not present

## 2018-08-22 DIAGNOSIS — Z79810 Long term (current) use of selective estrogen receptor modulators (SERMs): Secondary | ICD-10-CM

## 2018-08-22 DIAGNOSIS — I10 Essential (primary) hypertension: Secondary | ICD-10-CM | POA: Diagnosis not present

## 2018-08-22 DIAGNOSIS — Z923 Personal history of irradiation: Secondary | ICD-10-CM | POA: Diagnosis not present

## 2018-08-22 DIAGNOSIS — Z85820 Personal history of malignant melanoma of skin: Secondary | ICD-10-CM | POA: Diagnosis not present

## 2018-08-22 DIAGNOSIS — Z9221 Personal history of antineoplastic chemotherapy: Secondary | ICD-10-CM

## 2018-08-22 LAB — COMPREHENSIVE METABOLIC PANEL
ALBUMIN: 3.5 g/dL (ref 3.5–5.0)
ALT: 18 U/L (ref 0–44)
AST: 18 U/L (ref 15–41)
Alkaline Phosphatase: 64 U/L (ref 38–126)
Anion gap: 6 (ref 5–15)
BUN: 15 mg/dL (ref 6–20)
CALCIUM: 9 mg/dL (ref 8.9–10.3)
CO2: 27 mmol/L (ref 22–32)
CREATININE: 0.78 mg/dL (ref 0.44–1.00)
Chloride: 107 mmol/L (ref 98–111)
GFR calc Af Amer: >60 mL/min (ref >60)
GFR calc non Af Amer: >60 mL/min (ref >60)
GLUCOSE: 100 mg/dL — AB (ref 70–99)
Potassium: 4.8 mmol/L (ref 3.5–5.1)
SODIUM: 140 mmol/L (ref 135–145)
Total Bilirubin: 0.2 mg/dL — ABNORMAL LOW (ref 0.3–1.2)
Total Protein: 6.6 g/dL (ref 6.5–8.1)

## 2018-08-22 LAB — CBC WITH DIFFERENTIAL/PLATELET
Basophils Absolute: 0.1 10*3/uL (ref 0.0–0.1)
Basophils Relative: 1 %
EOS ABS: 0.3 10*3/uL (ref 0.0–0.5)
EOS PCT: 4 %
HCT: 39.7 % (ref 34.8–46.6)
Hemoglobin: 13.3 g/dL (ref 11.6–15.9)
Lymphocytes Relative: 31 %
Lymphs Abs: 2 10*3/uL (ref 0.9–3.3)
MCH: 32.5 pg (ref 25.1–34.0)
MCHC: 33.5 g/dL (ref 31.5–36.0)
MCV: 97 fL (ref 79.5–101.0)
MONO ABS: 0.4 10*3/uL (ref 0.1–0.9)
MONOS PCT: 6 %
NEUTROS PCT: 58 %
Neutro Abs: 3.9 10*3/uL (ref 1.5–6.5)
PLATELETS: 202 10*3/uL (ref 145–400)
RBC: 4.1 MIL/uL (ref 3.70–5.45)
RDW: 12.7 % (ref 11.2–14.5)
WBC: 6.7 10*3/uL (ref 3.9–10.3)

## 2018-08-22 MED ORDER — GABAPENTIN 300 MG PO CAPS: 300.0000 mg | ORAL_CAPSULE | Freq: Every day | ORAL | 4 refills | Status: DC

## 2018-08-22 MED ORDER — TAMOXIFEN CITRATE 20 MG PO TABS: 20.0000 mg | ORAL_TABLET | Freq: Every day | ORAL | 3 refills | Status: DC

## 2018-08-22 MED ORDER — VENLAFAXINE HCL ER 150 MG PO CP24: 75.0000 mg | ORAL_CAPSULE | Freq: Every day | ORAL | 4 refills | Status: DC

## 2018-08-23 ENCOUNTER — Telehealth: Payer: Self-pay | Admitting: Oncology

## 2018-08-23 NOTE — Telephone Encounter (Signed)
Per 8/29 los, made Sept 2020 appt.  Left message with patient on voicemail and mailed calendar.

## 2019-04-07 ENCOUNTER — Other Ambulatory Visit: Payer: Self-pay

## 2019-04-07 MED ORDER — ESTROGENS, CONJUGATED 0.625 MG/GM VA CREA: 1.0000 | TOPICAL_CREAM | Freq: Every day | VAGINAL | 3 refills | Status: DC

## 2019-08-22 ENCOUNTER — Telehealth: Payer: Self-pay | Admitting: Oncology

## 2019-08-22 NOTE — Telephone Encounter (Signed)
Called pt per 8/28 sch message - no availability on 9/2 - left message for patient letting her know

## 2019-08-23 ENCOUNTER — Other Ambulatory Visit: Payer: Self-pay | Admitting: Oncology

## 2019-08-28 ENCOUNTER — Ambulatory Visit: Payer: 59 | Admitting: Oncology

## 2019-08-28 ENCOUNTER — Other Ambulatory Visit: Payer: 59

## 2019-09-09 NOTE — Progress Notes (Signed)
[place]  [phone] [phone]     ID: Lorann Tani DOB: 25-Apr-1966  MR#: [id number]  TUU#:[id number]  Patient Care Team: Karleen Hampshire., MD as PCP - General (Internal Medicine) Rolm Bookbinder, MD as Consulting Physician (General Surgery) , Virgie Dad, MD as Consulting Physician (Oncology) Kyung Rudd, MD as Consulting Physician (Radiation Oncology) Mauro Kaufmann, RN as Registered Nurse Rockwell Germany, RN as Registered Nurse Rolm Bookbinder, MD as Consulting Physician (Dermatology) Val Eagle, MD as Referring Physician (General Practice) OTHER MD: Christene Slates M.D.  CHIEF COMPLAINT: Triple positive breast cancer  CURRENT TREATMENT:  tamoxifen   INTERVAL HISTORY: Cynthia [name] returns today for follow-up of her estrogen receptor positive breast cancer.   She continues on tamoxifen.  Her hot flashes have subsided.  She does not have significant vaginal wetness issues.  Incoordination with that she has been using some vaginal estrogen, but the cost has been prohibitive.  Up to $600 per month.  We discussed that at length today.  Mammography at [facility] last week was reportedly negative.  We have requested a copy of the report.  She was also evaluated for anxiety and palpitations at [facility] with a TEE 06/04/2019 showing normal left ventricular function and no significant valvular abnormalities.   REVIEW OF SYSTEMS: Carrissa tells me a lot of her anxiety was relationship related and that has been resolved.  She is now going flyfishing with other breast cancer survivors and kayaking with her sister and pretty much every week and she is out doing something.  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  She does have a sedentary job and we discussed that.  Aside from these issues a detailed review of systems today was noncontributory   BREAST CANCER  HISTORY: From the original intake note:  Cynthia [name] had noted a change in her left breast on self-exam the last week in March. She brought it to Dr. Trellis Paganini attention and on 04/13/2015 underwent diagnostic bilateral mammography and left ultrasonography at [facility]. This found the breast composition to be category D. An area of architectural distortion was noted in the left breast upper outer quadrant correlating to the palpable mass. By ultrasound there was a 2 cm lobulated mass in the left breast upper outer quadrant which was hypoechoic. There was a 4 mm nodule immediately adjacent to this felt to be a satellite lesion.  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  The patient's subsequent history is as detailed below   PAST MEDICAL HISTORY: Past Medical History:  Diagnosis Date  . Anxiety   . Breast cancer ([place]) 03/2015   IDC of UOQ of left breast; ER/PR+, Her2+  . Breast cancer of upper-outer quadrant of left female breast ([facility]) 04/16/2015  . Hot flashes   . Hypertension   . Skin cancer    Hx of melanoma at 53y; multiple basal cell carcinomas first dx in late 54s    PAST SURGICAL HISTORY: Past Surgical History:  Procedure Laterality Date  . BLADDER SUSPENSION  2009  . DILATION AND CURETTAGE OF UTERUS    . dnc    . PORTACATH PLACEMENT Right 04/27/2015   Procedure: INSERTION PORT-A-CATH;  Surgeon: Rolm Bookbinder, MD;  Location: [facility];  Service: General;  Laterality: Right;  . RADIOACTIVE SEED GUIDED PARTIAL MASTECTOMY WITH AXILLARY SENTINEL LYMPH NODE BIOPSY Left 09/21/2015   Procedure: RADIOACTIVE SEED GUIDED LUMPECTOMY WITH AXILLARY SENTINEL LYMPH NODE BIOPSY;  Surgeon: Rolm Bookbinder, MD;  Location: [facility];  Service: General;  Laterality: Left;     FAMILY HISTORY Family History  Problem Relation Age of Onset  . Breast cancer Maternal Aunt        dx. 8s  . Lung cancer Maternal Grandmother        dx. late 50s-early 60s; "metastasis to breast"; smoker  . Lung cancer Paternal Grandfather        dx. 15s; smokeless tobacco user  . Other Mother        hx of TAH  . Colon polyps Father        unspecified amt  . Melanoma Maternal Uncle 64  . Heart Problems Paternal Grandmother   . Diabetes Paternal Grandmother   . Melanoma Other 69   the patient's parents are still living as of April 2016. They are divorced. The patient is an only child. The patient's paternal grandfather and maternal grandmother had a history of lung cancer. On the mother's side there is a history of melanoma. The patient's mother's only sister was diagnosed with breast cancer in her 88s. There is no history of ovarian cancer in the family.    GYNECOLOGIC HISTORY:  No LMP recorded. (Menstrual status: IUD).  menarche age 24, first live birth age 7, the patient is GX P3. Her periods are ongoing but irregular. She has a copper IUD in place for contraception. She took oral contraceptives for approximately 15 years in the past with no complications.    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 She lives at home with her 5 year old father, who is disabled and has a history of depression.  The patient's children include daughter Cynthia [name], lives in [place] and is a Social worker, and Cynthia [name], lives in [place] and works in Press photographer.     ADVANCED DIRECTIVES:  not in place.    HEALTH MAINTENANCE: Social History   Tobacco Use  . Smoking status: Never Smoker  . Smokeless tobacco: Never Used  Substance Use Topics  . Alcohol use: Yes    Comment: occasion drink; only smoked cigs socially while in college  . Drug use: No     Colonoscopy: 2017   PAP:  Bone density: 02/02/2017  was normal with a T score of -0.8.  Lipid panel:  Allergies  Allergen Reactions  . Zofran [Ondansetron Hcl] Other (See Comments)    headache  . Adhesive [Tape] Rash    Steri strip    Current Outpatient Medications  Medication Sig Dispense Refill  . albuterol (PROVENTIL HFA;VENTOLIN HFA) 108 (90 Base) MCG/ACT inhaler Inhale 1-2 puffs into the lungs every 6 (six) hours as needed for wheezing or shortness of breath. 1 Inhaler 2  . ALPRAZolam (XANAX) 1 MG tablet Reported on 03/16/2016    . conjugated estrogens (PREMARIN) vaginal cream Place 1 Applicatorful vaginally daily. 42.5 g 3  . [facility] MELATONIN 3 MG TABS Take 1 tablet by mouth at bedtime as needed.  5  . Diclofenac Sodium CR (VOLTAREN-XR) 100 MG 24 hr tablet Take 1 tablet (100 mg total) daily by mouth. 10 tablet 0  . estradiol (ESTRING) 2 MG vaginal ring Place 2 mg vaginally every 3 (three) months. follow  package directions 1 each 12  . gabapentin (NEURONTIN) 300 MG capsule Take 1 capsule (300 mg total) by mouth at bedtime. 90 capsule 4  . loratadine (CLARITIN) 10 MG tablet Take 1 tablet (10 mg total) by mouth daily. 90 tablet 2  . MAGNESIUM PO Take 1 tablet by mouth daily.    . Multiple Vitamins-Minerals (CENTRUM SILVER PO) Take by mouth. Reported on 01/20/2016    . tamoxifen (NOLVADEX) 20 MG tablet Take 1 tablet (20 mg total) by mouth daily. 90 tablet 3  . valACYclovir (VALTREX) 500 MG tablet Take 1 tablet (500 mg total) by mouth 2 (two) times daily. 60 tablet 1  . venlafaxine XR (EFFEXOR-XR) 150 MG 24 hr capsule TAKE 1 CAPSULE (150 MG TOTAL) BY MOUTH DAILY WITH BREAKFAST. 90 capsule 4   No current facility-administered medications for this visit.     OBJECTIVE: middle-aged white woman in no acute distress  Vitals:   09/10/19 0832  BP: (!) 129/94  Pulse: 90  Resp: 18  Temp: 97.8 F (36.6 C)  SpO2: 97%     Body mass index is 29.01 kg/m.    ECOG FS:1 - Symptomatic but completely ambulatory  Sclerae unicteric, EOMs intact  Wearing a mask No cervical or supraclavicular adenopathy Lungs no rales or rhonchi Heart regular rate and rhythm Abd soft, nontender, positive bowel sounds MSK no focal spinal tenderness, no upper extremity lymphedema Neuro: nonfocal, well oriented, appropriate affect Breasts: The right breast is benign.  The left breast is status post lumpectomy and radiation with no evidence of disease recurrence.  Both axillae are benign.   LAB RESULTS:   CMP     Component Value Date/Time   NA 142 09/10/2019 0815   NA 140 04/26/2017 1308   K 4.7 09/10/2019 0815   K 3.9 04/26/2017 1308   CL 108 09/10/2019 0815   CO2 27 09/10/2019 0815   CO2 27 04/26/2017 1308   GLUCOSE 98 09/10/2019 0815   GLUCOSE 92 04/26/2017 1308   BUN 21 (H) 09/10/2019 0815   BUN 11.2 04/26/2017 1308   CREATININE 0.81 09/10/2019 0815   CREATININE 0.8 04/26/2017 1308   CALCIUM 8.6 (L) 09/10/2019 0815   CALCIUM 9.6 04/26/2017 1308   PROT 6.2 (L) 09/10/2019 0815   PROT 7.0 04/26/2017 1308   ALBUMIN 3.6 09/10/2019 0815   ALBUMIN 3.8 04/26/2017 1308   AST 16 09/10/2019 0815   AST 18 04/26/2017 1308   ALT 15 09/10/2019 0815   ALT 20 04/26/2017 1308   ALKPHOS 58 09/10/2019 0815   ALKPHOS 74 04/26/2017 1308   BILITOT <0.2 (L) 09/10/2019 0815   BILITOT 0.22 04/26/2017 1308   GFRNONAA >60 09/10/2019 0815   GFRAA >60 09/10/2019 0815    INo results found for: SPEP, UPEP  Lab Results  Component Value Date   WBC 6.8 09/10/2019   NEUTROABS 3.5 09/10/2019   HGB 13.1 09/10/2019   HCT 39.5 09/10/2019   MCV 99.5 09/10/2019   PLT 192 09/10/2019      Chemistry      Component Value Date/Time   NA 142 09/10/2019 0815   NA 140 04/26/2017 1308   K 4.7 09/10/2019 0815   K 3.9 04/26/2017 1308   CL 108 09/10/2019 0815   CO2 27 09/10/2019 0815   CO2 27 04/26/2017 1308   BUN 21 (H) 09/10/2019 0815   BUN 11.2 04/26/2017 1308   CREATININE 0.81 09/10/2019 0815   CREATININE 0.8 04/26/2017 1308      Component Value  Date/Time   CALCIUM 8.6 (L) 09/10/2019 0815   CALCIUM 9.6 04/26/2017 1308   ALKPHOS 58 09/10/2019 0815   ALKPHOS 74 04/26/2017 1308   AST 16 09/10/2019 0815   AST 18 04/26/2017 1308   ALT 15 09/10/2019 0815   ALT 20 04/26/2017 1308   BILITOT <0.2 (L) 09/10/2019 0815   BILITOT 0.22 04/26/2017 1308       No results found for: LABCA2  No components found for: LABCA125  No results for input(s): INR in the last 168 hours.  Urinalysis No results found for: COLORURINE, APPEARANCEUR, LABSPEC, PHURINE, GLUCOSEU, HGBUR, BILIRUBINUR, KETONESUR, PROTEINUR, UROBILINOGEN, NITRITE, LEUKOCYTESUR  STUDIES: No results found.   ASSESSMENT: 53 y.o. [place] woman status post left breast Upper outer quadrant biopsy 04/14/2015 for a clinical T2 N0, stage IIA invasive ductal carcinoma, grade 2, estrogen and progesterone receptor positive, with HER-2 amplified, and an MIB-1 of 20%  (1). 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  (4) radiation 10/27-12/13/16; Left breast/ 45 Gy at 1.8 Gy per fraction x 25 fractions.  Left breast boost/ 16 Gy at 2 Gy per fraction x 8 fractions  (5) tamoxifen started 02/08/2016  (a) bone density at [facility] 02/02/2017 was normal with a T score of -0.8  (6) genetics testing 09/06/2015 through the 8-gene Breast High/Moderate Risk Panel offered [facility] (Hope Pigeon, MD) found no  deleterious mutations in ATM, BRCA1, BRCA2, CDH1, CHEK2, PALB2, PTEN, and TP53.   PLAN: Dorismar is now 4 years out from definitive surgery for her breast cancer with no evidence of disease recurrence.  This is very favorable.  She is tolerating tamoxifen well and the plan is to continue that for total of 5 years, which will take as to February 2022.  I do not know why her vaginal estrogen is so expensive.  We discussed various options and I put in a prescription for the cream and for Estring at [facility] and hopefully she will be able to obtain it less expensively there.  Otherwise she will have her mammography in August of next year and see me again a year from now  She knows to call for any other issue that may develop before the next visit.  , Virgie Dad, MD  09/10/19 9:15 AM Medical Oncology and Hematology [facility] [place], [place] [address] Tel. [phone]    Fax. [phone]   I, Wilburn Mylar, am acting as scribe for Dr. Virgie Dad. .  I, Lurline Del MD, have reviewed the above documentation for accuracy and completeness, and I agree with the above.

## 2019-09-10 ENCOUNTER — Inpatient Hospital Stay: Payer: 59 | Attending: Oncology

## 2019-09-10 ENCOUNTER — Inpatient Hospital Stay (HOSPITAL_BASED_OUTPATIENT_CLINIC_OR_DEPARTMENT_OTHER): Payer: 59 | Admitting: Oncology

## 2019-09-10 ENCOUNTER — Other Ambulatory Visit: Payer: Self-pay

## 2019-09-10 VITALS — BP 129/94 | HR 90 | Temp 97.8°F | Resp 18 | Ht 68.0 in | Wt 190.8 lb

## 2019-09-10 DIAGNOSIS — I10 Essential (primary) hypertension: Secondary | ICD-10-CM | POA: Diagnosis not present

## 2019-09-10 DIAGNOSIS — Z170 Estrogen receptor positive status [ER+]: Secondary | ICD-10-CM

## 2019-09-10 DIAGNOSIS — Z79810 Long term (current) use of selective estrogen receptor modulators (SERMs): Secondary | ICD-10-CM | POA: Insufficient documentation

## 2019-09-10 DIAGNOSIS — C50412 Malignant neoplasm of upper-outer quadrant of left female breast: Secondary | ICD-10-CM

## 2019-09-10 DIAGNOSIS — Z79899 Other long term (current) drug therapy: Secondary | ICD-10-CM | POA: Insufficient documentation

## 2019-09-10 DIAGNOSIS — F419 Anxiety disorder, unspecified: Secondary | ICD-10-CM | POA: Insufficient documentation

## 2019-09-10 DIAGNOSIS — Z85820 Personal history of malignant melanoma of skin: Secondary | ICD-10-CM | POA: Insufficient documentation

## 2019-09-10 LAB — CBC WITH DIFFERENTIAL/PLATELET
Abs Immature Granulocytes: 0.01 K/uL (ref 0.00–0.07)
Basophils Absolute: 0.1 K/uL (ref 0.0–0.1)
Basophils Relative: 1 %
Eosinophils Absolute: 0.4 K/uL (ref 0.0–0.5)
Eosinophils Relative: 7 %
HCT: 39.5 % (ref 36.0–46.0)
Hemoglobin: 13.1 g/dL (ref 12.0–15.0)
Immature Granulocytes: 0 %
Lymphocytes Relative: 33 %
Lymphs Abs: 2.2 K/uL (ref 0.7–4.0)
MCH: 33 pg (ref 26.0–34.0)
MCHC: 33.2 g/dL (ref 30.0–36.0)
MCV: 99.5 fL (ref 80.0–100.0)
Monocytes Absolute: 0.6 K/uL (ref 0.1–1.0)
Monocytes Relative: 8 %
Neutro Abs: 3.5 K/uL (ref 1.7–7.7)
Neutrophils Relative %: 51 %
Platelets: 192 K/uL (ref 150–400)
RBC: 3.97 MIL/uL (ref 3.87–5.11)
RDW: 12 % (ref 11.5–15.5)
WBC: 6.8 K/uL (ref 4.0–10.5)
nRBC: 0 % (ref 0.0–0.2)

## 2019-09-10 LAB — COMPREHENSIVE METABOLIC PANEL
ALT: 15 U/L (ref 0–44)
AST: 16 U/L (ref 15–41)
Albumin: 3.6 g/dL (ref 3.5–5.0)
Alkaline Phosphatase: 58 U/L (ref 38–126)
Anion gap: 7 (ref 5–15)
BUN: 21 mg/dL — ABNORMAL HIGH (ref 6–20)
CO2: 27 mmol/L (ref 22–32)
Calcium: 8.6 mg/dL — ABNORMAL LOW (ref 8.9–10.3)
Chloride: 108 mmol/L (ref 98–111)
Creatinine, Ser: 0.81 mg/dL (ref 0.44–1.00)
GFR calc Af Amer: >60 mL/min (ref >60)
GFR calc non Af Amer: >60 mL/min (ref >60)
Glucose, Bld: 98 mg/dL (ref 70–99)
Potassium: 4.7 mmol/L (ref 3.5–5.1)
Sodium: 142 mmol/L (ref 135–145)
Total Bilirubin: <0.2 mg/dL — ABNORMAL LOW (ref 0.3–1.2)
Total Protein: 6.2 g/dL — ABNORMAL LOW (ref 6.5–8.1)

## 2019-09-10 MED ORDER — GABAPENTIN 300 MG PO CAPS: 300.0000 mg | ORAL_CAPSULE | Freq: Every day | ORAL | 4 refills | Status: DC

## 2019-09-10 MED ORDER — TAMOXIFEN CITRATE 20 MG PO TABS: 20.0000 mg | ORAL_TABLET | Freq: Every day | ORAL | 3 refills | Status: DC

## 2019-09-10 MED ORDER — ESTRING 2 MG VA RING: 2.0000 mg | VAGINAL_RING | VAGINAL | 12 refills | Status: DC

## 2019-09-10 MED ORDER — ESTROGENS, CONJUGATED 0.625 MG/GM VA CREA: 1.0000 | TOPICAL_CREAM | Freq: Every day | VAGINAL | 3 refills | Status: DC

## 2019-09-10 MED ORDER — VENLAFAXINE HCL ER 150 MG PO CP24: 75.0000 mg | ORAL_CAPSULE | Freq: Every day | ORAL | 4 refills | Status: DC

## 2019-09-11 ENCOUNTER — Telehealth: Payer: Self-pay | Admitting: Oncology

## 2019-09-11 NOTE — Telephone Encounter (Signed)
I talk with patient regarding schedule  

## 2019-10-23 ENCOUNTER — Other Ambulatory Visit: Payer: Self-pay | Admitting: Oncology

## 2020-07-05 ENCOUNTER — Other Ambulatory Visit: Payer: Self-pay | Admitting: Oncology

## 2020-08-13 ENCOUNTER — Other Ambulatory Visit: Payer: Self-pay | Admitting: Oncology

## 2020-08-13 MED ORDER — TAMOXIFEN CITRATE 20 MG PO TABS: 20.0000 mg | ORAL_TABLET | Freq: Every day | ORAL | 0 refills | Status: DC

## 2020-08-13 NOTE — Telephone Encounter (Signed)
Pt called to this RN to state she is currently in [place] until early September ( she has a new grandchild) and needs additional tamoxifen - " I guess I didn't count my pills right ".  [facility] day supply sent to local [facility] for pt to pick up.

## 2020-09-07 ENCOUNTER — Other Ambulatory Visit: Payer: Self-pay | Admitting: Oncology

## 2020-09-08 ENCOUNTER — Other Ambulatory Visit: Payer: Self-pay | Admitting: *Deleted

## 2020-09-08 DIAGNOSIS — C50412 Malignant neoplasm of upper-outer quadrant of left female breast: Secondary | ICD-10-CM

## 2020-09-08 NOTE — Progress Notes (Signed)
[place]  [phone] [phone]     ID: Cynthia [name] DOB: 1965/12/30  MR#: [id number]  YYT#:[id number]  Patient Care Team: Karleen Hampshire., MD as PCP - General (Internal Medicine) Rolm Bookbinder, MD as Consulting Physician (General Surgery) Wyolene Weimann, Virgie Dad, MD as Consulting Physician (Oncology) Kyung Rudd, MD as Consulting Physician (Radiation Oncology) Mauro Kaufmann, RN as Registered Nurse Rockwell Germany, RN as Registered Nurse Rolm Bookbinder, MD as Consulting Physician (Dermatology) Val Eagle, MD as Referring Physician (General Practice) OTHER MD: Christene Slates M.D.  CHIEF COMPLAINT: Triple positive breast cancer  CURRENT TREATMENT:  tamoxifen   INTERVAL HISTORY: Cynthia [name] returns today for follow-up of her estrogen receptor positive breast cancer.   She continues on tamoxifen.  Her hot flashes have subsided.  She does not have significant vaginal wetness issues.  She is not using any vaginal estrogens at this point.  She tells me she is still spots occasionally and that an [facility] recently was still in the premenopausal range  Since her last visit, she tested positive for Covid-19 on 11/09/2019.  She had her most recent mammogram 08/27/2019.  She is scheduled for repeat in October.   REVIEW OF SYSTEMS: Cynthia [name] just returned from a long visit in [place] where she spent time with her first grandchild.  She is exercising regularly.  A detailed review of systems today was otherwise stable   BREAST CANCER HISTORY: From the original intake note:  Cynthia [name] had noted a change in her left breast on self-exam the last week in March. She brought it to Dr. Trellis Paganini attention and on 04/13/2015 underwent diagnostic bilateral mammography and left ultrasonography at [facility]. This found the breast composition to be category D. An area of architectural distortion was noted in the left breast upper outer quadrant correlating to the palpable mass. By  ultrasound there was a 2 cm lobulated mass in the left breast upper outer quadrant which was hypoechoic. There was a 4 mm nodule immediately adjacent to this felt to be a satellite lesion.  Biopsy of the left breast mass in question 04/14/2015 showed ([id number]) invasive ductal carcinoma, grade 2, estrogen receptor 90% positive, progesterone receptor 90% positive, with an MIB-1 of 20%, and HER-2 amplified, the signals ratio being 5.36.  The patient's subsequent history is as detailed below   PAST MEDICAL HISTORY: Past Medical History:  Diagnosis Date  . Anxiety   . Breast cancer ([place]) 03/2015   IDC of UOQ of left breast; ER/PR+, Her2+  . Breast cancer of upper-outer quadrant of left female breast ([place]) 04/16/2015  . Hot flashes   . Hypertension   . Skin cancer    Hx of melanoma at 71y; multiple basal cell carcinomas first dx in late 49s    PAST SURGICAL HISTORY: Past Surgical History:  Procedure Laterality Date  . BLADDER SUSPENSION  2009  . DILATION AND CURETTAGE OF UTERUS    . dnc    . PORTACATH PLACEMENT Right 04/27/2015   Procedure: INSERTION PORT-A-CATH;  Surgeon: Rolm Bookbinder, MD;  Location: [facility];  Service: General;  Laterality: Right;  . RADIOACTIVE SEED GUIDED PARTIAL MASTECTOMY WITH AXILLARY SENTINEL LYMPH NODE BIOPSY Left 09/21/2015   Procedure: RADIOACTIVE SEED GUIDED LUMPECTOMY WITH AXILLARY SENTINEL LYMPH NODE BIOPSY;  Surgeon: Rolm Bookbinder, MD;  Location: [facility];  Service: General;  Laterality: Left;    FAMILY HISTORY Family History  Problem Relation Age of Onset  . Breast cancer Maternal Aunt  dx. 45s  . Lung cancer Maternal Grandmother        dx. late 50s-early 60s; "metastasis to breast"; smoker  . Lung cancer Paternal Grandfather        dx. 98s; smokeless tobacco user  . Other Mother        hx of TAH  . Colon polyps Father        unspecified amt  . Melanoma Maternal Uncle 64  . Heart Problems Paternal  Grandmother   . Diabetes Paternal Grandmother   . Melanoma Other 49   the patient's parents are still living as of April 2016. They are divorced. The patient is an only child. The patient's paternal grandfather and maternal grandmother had a history of lung cancer. On the mother's side there is a history of melanoma. The patient's mother's only sister was diagnosed with breast cancer in her 70s. There is no history of ovarian cancer in the family.    GYNECOLOGIC HISTORY:  No LMP recorded. (Menstrual status: IUD).  menarche age 70, first live birth age 14, the patient is GX P3.  She still has occasional spotting (September 2021).. She has a copper IUD in place for contraception. She took oral contraceptives for approximately 15 years in the past with no complications.    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 She lives at home with her 77 year old father, who is disabled and has a history of depression.  The patient's children include daughter [name], lives in [place] and is a Social worker, and [name], lives in [place] and works in Press photographer.     ADVANCED DIRECTIVES:  not in place.    HEALTH MAINTENANCE: Social History   Tobacco Use  . Smoking status: Never Smoker  . Smokeless tobacco: Never Used  Substance Use Topics  . Alcohol use: Yes    Comment: occasion drink; only smoked cigs socially while in college  . Drug use: No     Colonoscopy: 2017   PAP:  Bone density: 02/02/2017 was normal with a T score of -0.8.  Lipid panel:  Allergies  Allergen Reactions  . Zofran [Ondansetron Hcl] Other (See Comments)    headache  . Adhesive [Tape] Rash    Steri strip    Current Outpatient Medications  Medication Sig Dispense Refill  . conjugated estrogens (PREMARIN) vaginal cream Place 1 Applicatorful vaginally daily. 42.5 g 3  . Diclofenac Sodium CR (VOLTAREN-XR) 100 MG 24 hr tablet Take  1 tablet (100 mg total) daily by mouth. 10 tablet 0  . estradiol (ESTRING) 2 MG vaginal ring Place 2 mg vaginally every 3 (three) months. follow package directions 1 each 12  . gabapentin (NEURONTIN) 300 MG capsule Take 1 capsule (300 mg total) by mouth at bedtime. 90 capsule 4  . loratadine (CLARITIN) 10 MG tablet Take 1 tablet (10 mg total) by mouth daily. 90 tablet 2  . MAGNESIUM PO Take 1 tablet by mouth daily.    . Multiple Vitamins-Minerals (CENTRUM SILVER PO) Take by mouth. Reported on 01/20/2016    . tamoxifen (NOLVADEX) 20 MG tablet Take 1 tablet (20 mg total) by mouth daily. 90 tablet 0  . tamoxifen (NOLVADEX) 20 MG tablet Take 1 tablet (20 mg total) by mouth daily. 30 tablet 0  . tamoxifen (NOLVADEX) 20 MG tablet TAKE 1 TABLET BY MOUTH EVERY DAY 30 tablet 0  . valACYclovir (VALTREX) 500 MG tablet Take 1 tablet (500 mg total) by  mouth 2 (two) times daily. 60 tablet 1  . venlafaxine XR (EFFEXOR-XR) 150 MG 24 hr capsule Take 1 capsule (150 mg total) by mouth daily with breakfast. 90 capsule 4   No current facility-administered medications for this visit.    OBJECTIVE: White woman who appears well  Vitals:   09/09/20 0852  BP: 119/88  Pulse: 82  Resp: 20  Temp: 97.6 F (36.4 C)  SpO2: 97%     Body mass index is 29.51 kg/m.    ECOG FS:1 - Symptomatic but completely ambulatory  Sclerae unicteric, EOMs intact Wearing a mask No cervical or supraclavicular adenopathy Lungs no rales or rhonchi Heart regular rate and rhythm Abd soft, nontender, positive bowel sounds MSK no focal spinal tenderness, no upper extremity lymphedema Neuro: nonfocal, well oriented, appropriate affect Breasts: The right breast is moderately lumpectomy, with some induration particularly inferiorly.  The left breast is status post lumpectomy and radiation again with some induration in the upper outer quadrant.  There is no evidence of local recurrence.  Both axillae are benign.   LAB RESULTS:   CMP       Component Value Date/Time   NA 142 09/10/2019 0815   NA 140 04/26/2017 1308   K 4.7 09/10/2019 0815   K 3.9 04/26/2017 1308   CL 108 09/10/2019 0815   CO2 27 09/10/2019 0815   CO2 27 04/26/2017 1308   GLUCOSE 98 09/10/2019 0815   GLUCOSE 92 04/26/2017 1308   BUN 21 (H) 09/10/2019 0815   BUN 11.2 04/26/2017 1308   CREATININE 0.81 09/10/2019 0815   CREATININE 0.8 04/26/2017 1308   CALCIUM 8.6 (L) 09/10/2019 0815   CALCIUM 9.6 04/26/2017 1308   PROT 6.2 (L) 09/10/2019 0815   PROT 7.0 04/26/2017 1308   ALBUMIN 3.6 09/10/2019 0815   ALBUMIN 3.8 04/26/2017 1308   AST 16 09/10/2019 0815   AST 18 04/26/2017 1308   ALT 15 09/10/2019 0815   ALT 20 04/26/2017 1308   ALKPHOS 58 09/10/2019 0815   ALKPHOS 74 04/26/2017 1308   BILITOT <0.2 (L) 09/10/2019 0815   BILITOT 0.22 04/26/2017 1308   GFRNONAA >60 09/10/2019 0815   GFRAA >60 09/10/2019 0815    INo results found for: SPEP, UPEP  Lab Results  Component Value Date   WBC 6.7 09/09/2020   NEUTROABS 3.2 09/09/2020   HGB 13.8 09/09/2020   HCT 40.9 09/09/2020   MCV 96.5 09/09/2020   PLT 213 09/09/2020      Chemistry      Component Value Date/Time   NA 142 09/10/2019 0815   NA 140 04/26/2017 1308   K 4.7 09/10/2019 0815   K 3.9 04/26/2017 1308   CL 108 09/10/2019 0815   CO2 27 09/10/2019 0815   CO2 27 04/26/2017 1308   BUN 21 (H) 09/10/2019 0815   BUN 11.2 04/26/2017 1308   CREATININE 0.81 09/10/2019 0815   CREATININE 0.8 04/26/2017 1308      Component Value Date/Time   CALCIUM 8.6 (L) 09/10/2019 0815   CALCIUM 9.6 04/26/2017 1308   ALKPHOS 58 09/10/2019 0815   ALKPHOS 74 04/26/2017 1308   AST 16 09/10/2019 0815   AST 18 04/26/2017 1308   ALT 15 09/10/2019 0815   ALT 20 04/26/2017 1308   BILITOT <0.2 (L) 09/10/2019 0815   BILITOT 0.22 04/26/2017 1308       No results found for: LABCA2  No components found for: LABCA125  No results for input(s): INR in the last 168  hours.  Urinalysis No results found  for: COLORURINE, APPEARANCEUR, LABSPEC, PHURINE, GLUCOSEU, HGBUR, BILIRUBINUR, KETONESUR, PROTEINUR, UROBILINOGEN, NITRITE, LEUKOCYTESUR  STUDIES: No results found.   ASSESSMENT: 54 y.o. [place] woman status post left breast Upper outer quadrant biopsy 04/14/2015 for a clinical T2 N0, stage IIA invasive ductal carcinoma, grade 2, estrogen and progesterone receptor positive, with HER-2 amplified, and an MIB-1 of 20%  (1). 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  (4) radiation 10/27-12/13/16; Left breast/ 45 Gy at 1.8 Gy per fraction x 25 fractions.  Left breast boost/ 16 Gy at 2 Gy per fraction x 8 fractions  (5) tamoxifen started 02/08/2016  (a) bone density at [facility] 02/02/2017 was normal with a T score of -0.8  (6) genetics testing 09/06/2015 through the 8-gene Breast High/Moderate Risk Panel offered [facility] (Hope Pigeon, MD) found no deleterious mutations in ATM, BRCA1, BRCA2, CDH1, CHEK2, PALB2, PTEN, and TP53.    PLAN: Dorotea is now 5 years out from definitive surgery for her breast cancer with no evidence of disease recurrence.  This is very favorable.  She will complete 5 years of tamoxifen  2022.  In her case I do not see any compelling reason for her to continue tamoxifen to a total of 10 years.  I alerted her that if she wants to get off the venlafaxine at some point in the future  she will need specific instructions as it takes about 6 weeks to taper off that medication  At this point I feel comfortable releasing her to her primary care physicians.  All she will need in terms of breast cancer follow-up is her yearly mammogram which she obtains October at [facility] and a yearly physician breast exam  I am not making any further appointments for her here but will be glad to see her again at any point in the future if and when the need arises  Total encounter time 25 minutes.*       Finch Costanzo, Virgie Dad, MD  09/09/20 9:12 AM Medical Oncology and Hematology [facility] [place], [place] [address] Tel. [phone]    Fax. [phone]   I, Wilburn Mylar, am acting as scribe for Dr. Virgie Dad. Hikaru Delorenzo.  I, Lurline Del MD, have reviewed the above documentation for accuracy and completeness, and I agree with the above.   *Total Encounter Time as defined by the Centers for Medicare and Medicaid Services includes, in addition to the face-to-face time of a patient visit (documented in the note above) non-face-to-face time: obtaining and reviewing outside history, ordering and reviewing medications, tests or procedures, care coordination (communications with other health care professionals or caregivers) and documentation in the medical record.

## 2020-09-09 ENCOUNTER — Inpatient Hospital Stay: Payer: No Typology Code available for payment source

## 2020-09-09 ENCOUNTER — Other Ambulatory Visit: Payer: Self-pay

## 2020-09-09 ENCOUNTER — Inpatient Hospital Stay: Payer: No Typology Code available for payment source | Attending: Oncology | Admitting: Oncology

## 2020-09-09 VITALS — BP 119/88 | HR 82 | Temp 97.6°F | Resp 20 | Ht 68.0 in | Wt 194.1 lb

## 2020-09-09 DIAGNOSIS — F419 Anxiety disorder, unspecified: Secondary | ICD-10-CM | POA: Diagnosis not present

## 2020-09-09 DIAGNOSIS — Z85820 Personal history of malignant melanoma of skin: Secondary | ICD-10-CM | POA: Insufficient documentation

## 2020-09-09 DIAGNOSIS — C50412 Malignant neoplasm of upper-outer quadrant of left female breast: Secondary | ICD-10-CM

## 2020-09-09 DIAGNOSIS — Z170 Estrogen receptor positive status [ER+]: Secondary | ICD-10-CM

## 2020-09-09 DIAGNOSIS — I10 Essential (primary) hypertension: Secondary | ICD-10-CM | POA: Diagnosis not present

## 2020-09-09 DIAGNOSIS — Z79899 Other long term (current) drug therapy: Secondary | ICD-10-CM | POA: Insufficient documentation

## 2020-09-09 DIAGNOSIS — Z803 Family history of malignant neoplasm of breast: Secondary | ICD-10-CM | POA: Insufficient documentation

## 2020-09-09 DIAGNOSIS — Z801 Family history of malignant neoplasm of trachea, bronchus and lung: Secondary | ICD-10-CM | POA: Insufficient documentation

## 2020-09-09 DIAGNOSIS — Z79810 Long term (current) use of selective estrogen receptor modulators (SERMs): Secondary | ICD-10-CM | POA: Diagnosis not present

## 2020-09-09 LAB — CMP (CANCER CENTER ONLY)
ALT: 15 U/L (ref 0–44)
AST: 18 U/L (ref 15–41)
Albumin: 3.6 g/dL (ref 3.5–5.0)
Alkaline Phosphatase: 58 U/L (ref 38–126)
Anion gap: 8 (ref 5–15)
BUN: 17 mg/dL (ref 6–20)
CO2: 23 mmol/L (ref 22–32)
Calcium: 9.2 mg/dL (ref 8.9–10.3)
Chloride: 109 mmol/L (ref 98–111)
Creatinine: 0.8 mg/dL (ref 0.44–1.00)
GFR, Est AFR Am: >60 mL/min (ref >60)
GFR, Estimated: >60 mL/min (ref >60)
Glucose, Bld: 94 mg/dL (ref 70–99)
Potassium: 4.4 mmol/L (ref 3.5–5.1)
Sodium: 140 mmol/L (ref 135–145)
Total Bilirubin: 0.4 mg/dL (ref 0.3–1.2)
Total Protein: 6.9 g/dL (ref 6.5–8.1)

## 2020-09-09 LAB — CBC WITH DIFFERENTIAL (CANCER CENTER ONLY)
Abs Immature Granulocytes: 0.01 10*3/uL (ref 0.00–0.07)
Basophils Absolute: 0.1 10*3/uL (ref 0.0–0.1)
Basophils Relative: 1 %
Eosinophils Absolute: 0.2 10*3/uL (ref 0.0–0.5)
Eosinophils Relative: 3 %
HCT: 40.9 % (ref 36.0–46.0)
Hemoglobin: 13.8 g/dL (ref 12.0–15.0)
Immature Granulocytes: 0 %
Lymphocytes Relative: 41 %
Lymphs Abs: 2.8 10*3/uL (ref 0.7–4.0)
MCH: 32.5 pg (ref 26.0–34.0)
MCHC: 33.7 g/dL (ref 30.0–36.0)
MCV: 96.5 fL (ref 80.0–100.0)
Monocytes Absolute: 0.5 10*3/uL (ref 0.1–1.0)
Monocytes Relative: 7 %
Neutro Abs: 3.2 10*3/uL (ref 1.7–7.7)
Neutrophils Relative %: 48 %
Platelet Count: 213 10*3/uL (ref 150–400)
RBC: 4.24 MIL/uL (ref 3.87–5.11)
RDW: 11.6 % (ref 11.5–15.5)
WBC Count: 6.7 10*3/uL (ref 4.0–10.5)
nRBC: 0 % (ref 0.0–0.2)

## 2020-09-09 MED ORDER — TAMOXIFEN CITRATE 20 MG PO TABS
20.0000 mg | ORAL_TABLET | Freq: Every day | ORAL | 0 refills | Status: DC
Start: 2020-09-09 — End: 2021-01-03

## 2020-09-09 MED ORDER — VALACYCLOVIR HCL 500 MG PO TABS
500.0000 mg | ORAL_TABLET | Freq: Two times a day (BID) | ORAL | 1 refills | Status: DC
Start: 2020-09-09 — End: 2020-10-07

## 2020-09-09 MED ORDER — VENLAFAXINE HCL ER 150 MG PO CP24: 150.0000 mg | ORAL_CAPSULE | Freq: Every day | ORAL | 4 refills | Status: DC

## 2020-09-10 ENCOUNTER — Telehealth: Payer: Self-pay | Admitting: Oncology

## 2020-09-10 NOTE — Telephone Encounter (Signed)
No 9/16 los. No changes made to pt's schedule.

## 2020-10-07 ENCOUNTER — Other Ambulatory Visit: Payer: Self-pay | Admitting: Oncology

## 2020-11-02 ENCOUNTER — Other Ambulatory Visit: Payer: Self-pay | Admitting: Oncology

## 2020-11-29 ENCOUNTER — Other Ambulatory Visit: Payer: Self-pay | Admitting: Oncology

## 2020-12-23 ENCOUNTER — Other Ambulatory Visit: Payer: Self-pay | Admitting: Oncology

## 2020-12-29 ENCOUNTER — Encounter: Payer: Self-pay | Admitting: Oncology

## 2021-01-02 ENCOUNTER — Other Ambulatory Visit: Payer: Self-pay | Admitting: Oncology

## 2021-01-28 ENCOUNTER — Other Ambulatory Visit: Payer: Self-pay | Admitting: Oncology

## 2021-03-03 ENCOUNTER — Encounter: Payer: Self-pay | Admitting: Oncology

## 2021-03-04 ENCOUNTER — Other Ambulatory Visit: Payer: Self-pay | Admitting: Oncology

## 2021-03-27 ENCOUNTER — Other Ambulatory Visit: Payer: Self-pay | Admitting: Oncology

## 2021-04-19 ENCOUNTER — Other Ambulatory Visit: Payer: Self-pay | Admitting: Oncology

## 2021-05-17 ENCOUNTER — Other Ambulatory Visit: Payer: Self-pay | Admitting: Oncology

## 2021-09-27 ENCOUNTER — Other Ambulatory Visit: Payer: Self-pay | Admitting: *Deleted

## 2021-09-27 ENCOUNTER — Encounter: Payer: Self-pay | Admitting: *Deleted

## 2021-09-27 MED ORDER — VENLAFAXINE HCL 37.5 MG PO TABS: ORAL_TABLET | ORAL | 1 refills | Status: DC

## 2021-09-27 MED ORDER — VENLAFAXINE HCL 75 MG PO TABS: ORAL_TABLET | ORAL | 1 refills | Status: DC

## 2021-10-17 ENCOUNTER — Other Ambulatory Visit: Payer: Self-pay | Admitting: Oncology

## 2021-10-25 HISTORY — PX: DILATION AND CURETTAGE OF UTERUS: SHX78

## 2021-10-30 ENCOUNTER — Other Ambulatory Visit: Payer: Self-pay | Admitting: Oncology

## 2021-10-31 ENCOUNTER — Other Ambulatory Visit: Payer: Self-pay | Admitting: Oncology

## 2021-11-01 ENCOUNTER — Encounter: Payer: Self-pay | Admitting: Oncology

## 2021-11-09 ENCOUNTER — Other Ambulatory Visit: Payer: Self-pay | Admitting: Oncology

## 2021-11-10 ENCOUNTER — Encounter: Payer: Self-pay | Admitting: *Deleted

## 2021-11-10 ENCOUNTER — Other Ambulatory Visit: Payer: Self-pay | Admitting: Oncology

## 2021-11-23 DIAGNOSIS — M545 Low back pain, unspecified: Secondary | ICD-10-CM

## 2021-11-23 HISTORY — DX: Low back pain, unspecified: M54.50

## 2021-11-24 ENCOUNTER — Other Ambulatory Visit: Payer: Self-pay | Admitting: Oncology

## 2021-12-03 ENCOUNTER — Other Ambulatory Visit: Payer: Self-pay | Admitting: Oncology

## 2021-12-08 ENCOUNTER — Other Ambulatory Visit: Payer: Self-pay | Admitting: Oncology

## 2021-12-09 NOTE — Telephone Encounter (Signed)
Signed 4 days ago by Dr. Jana Hakim. Gardiner Rhyme, RN

## 2022-04-18 ENCOUNTER — Other Ambulatory Visit: Payer: Self-pay | Admitting: Obstetrics & Gynecology

## 2022-05-04 ENCOUNTER — Other Ambulatory Visit: Payer: Self-pay

## 2022-05-04 ENCOUNTER — Encounter (HOSPITAL_BASED_OUTPATIENT_CLINIC_OR_DEPARTMENT_OTHER): Payer: Self-pay | Admitting: Obstetrics & Gynecology

## 2022-05-04 NOTE — Progress Notes (Signed)
? ? Your procedure is scheduled on Thursday, 05/11/22. ? Report to [facility] ? Call this number if you have problems the morning of surgery  :[phone]. ? ? [facility].  WE ARE LOCATED IN [facility]. ? ?PLEASE BRING YOUR INSURANCE CARD AND PHOTO ID DAY OF SURGERY. ? ?ONLY 2 PEOPLE ARE ALLOWED IN  WAITING  ROOM.  ?                                   ? REMEMBER: ? DO NOT EAT FOOD, CANDY GUM OR MINTS  AFTER MIDNIGHT THE NIGHT BEFORE YOUR SURGERY . YOU MAY HAVE CLEAR LIQUIDS FROM MIDNIGHT THE NIGHT BEFORE YOUR SURGERY UNTIL  4:30 AM. NO CLEAR LIQUIDS AFTER   4:30 AM DAY OF SURGERY. ? ?YOU MAY  BRUSH YOUR TEETH MORNING OF SURGERY AND RINSE YOUR MOUTH OUT, NO CHEWING GUM CANDY OR MINTS. ? ? ? ? ?CLEAR LIQUID DIET ? ? ?Foods Allowed                                                                     Foods Excluded ? ?Coffee and tea, regular and decaf                             liquids that you cannot  ?Plain Jell-O                                                                   see through such as: ?Fruit ices (not with fruit pulp)                                     milk, soups, orange juice  ?Plain  Popsicles                                    All solid food ?Carbonated beverages, regular and diet                                    ?Cranberry, grape and apple juices ?Sports drinks like Gatorade ?_____________________________________________________________________ ?  ? ? TAKE THESE MEDICATIONS MORNING OF SURGERY: Carvedilol, Claritin, Effexor, Valtrex if needed ? ? ? UP TO 4 VISITORS  MAY VISIT IN THE EXTENDED RECOVERY ROOM UNTIL 800 PM ONLY.  1 VISITOR AGE 1 AND OVER MAY SPEND THE NIGHT AND MUST BE IN EXTENDED RECOVERY ROOM NO LATER THAN 800 PM . YOUR DISCHARGE TIME AFTER YOU SPEND THE NIGHT IS 900 AM THE MORNING AFTER YOUR SURGERY. ? ?YOU MAY PACK A SMALL OVERNIGHT BAG WITH TOILETRIES FOR  YOUR OVERNIGHT STAY IF YOU WISH. ? ?YOUR  PRESCRIPTION MEDICATIONS WILL BE PROVIDED DURING [facility]. ? ? ?                                   ?DO NOT WEAR JEWERLY, MAKE UP. ?DO NOT WEAR LOTIONS, POWDERS, PERFUMES OR NAIL POLISH ON YOUR FINGERNAILS. TOENAIL POLISH IS OK TO WEAR. ?DO NOT SHAVE FOR 48 HOURS PRIOR TO DAY OF SURGERY. ?MEN MAY SHAVE FACE AND NECK. ?CONTACTS, GLASSES, OR DENTURES MAY NOT BE WORN TO SURGERY. ? ?REMEMBER: NO SMOKING, DRUGS OR ALCOHOL FOR 24 HOURS BEFORE YOUR SURGERY. ?                                   ?[facility] IS NOT RESPONSIBLE  FOR ANY BELONGINGS.                                  ?                                  . ?          [facility] - Preparing for Surgery ?Before surgery, you can play an important role.  Because skin is not sterile, your skin needs to be as free of germs as possible.  You can reduce the number of germs on your skin by washing with CHG (chlorahexidine gluconate) soap before surgery.  CHG is an antiseptic cleaner which kills germs and bonds with the skin to continue killing germs even after washing. ?Please DO NOT use if you have an allergy to CHG or antibacterial soaps.  If your skin becomes reddened/irritated stop using the CHG and inform your nurse when you arrive at Short Stay. ?Do not shave (including legs and underarms) for at least 48 hours prior to the first CHG shower.  You may shave your face/neck. ?Please follow these instructions carefully: ? 1.  Shower with CHG Soap the night before surgery and the  morning of Surgery. ? 2.  If you choose to wash your hair, wash your hair first as usual with your  normal  shampoo. ? 3.  After you shampoo, rinse your hair and body thoroughly to remove the  shampoo.                            ?4.  Use CHG as you would any other liquid soap.  You can apply chg directly  to the skin and wash , please wash your belly button thoroughly with chg soap provided night before and morning of your surgery. ?                    Gently with a scrungie or clean  washcloth. ? 5.  Apply the CHG Soap to your body ONLY FROM THE NECK DOWN.   Do not use on face/ open      ?                     Wound or open sores. Avoid contact with eyes, ears mouth and genitals (private parts).  ?  Wash face,  Genitals (private parts) with your normal soap. ?            6.  Wash thoroughly, paying special attention to the area where your surgery  will be performed. ? 7.  Thoroughly rinse your body with warm water from the neck down. ? 8.  DO NOT shower/wash with your normal soap after using and rinsing off  the CHG Soap. ?               9.  Pat yourself dry with a clean towel. ?           10.  Wear clean pajamas. ?           11.  Place clean sheets on your bed the night of your first shower and do not  sleep with pets. ?Day of Surgery : ?Do not apply any lotions/deodorants the morning of surgery.  Please wear clean clothes to the hospital/surgery center. ? ?IF YOU HAVE ANY SKIN IRRITATION OR PROBLEMS WITH THE SURGICAL SOAP, PLEASE GET A BAR OF GOLD DIAL SOAP AND SHOWER THE NIGHT BEFORE YOUR SURGERY AND THE MORNING OF YOUR SURGERY. PLEASE LET THE NURSE KNOW MORNING OF YOUR SURGERY IF YOU HAD ANY PROBLEMS WITH THE SURGICAL SOAP. ? ? ?________________________________________________________________________                  ?                                    ?  QUESTIONS CALL [facility] PRE OP NURSE PHONE [phone].                                    ?

## 2022-05-04 NOTE — Progress Notes (Signed)
Spoke w/ via phone for pre-op interview---Tyesha ?Lab needs dos---- none              ?Lab results------05/08/22 lab appt for CBC, BMP, EKG, type & screen ?COVID test -----patient states asymptomatic no test needed ?Arrive at -------0530 on Thursday, 05/11/22 ?NPO after MN NO Solid Food.  Clear liquids from MN until---0430 ?Med rec completed ?Medications to take morning of surgery -----carvedilol, claritin, valtrex prn, Effexor ?Diabetic medication -----n/a ?Patient instructed no nail polish to be worn day of surgery ?Patient instructed to bring photo id and insurance card day of surgery ?Patient aware to have Driver (ride ) / caregiver    for 24 hours after surgery - Hollace Hayward ?Patient Special Instructions -----Extended / overnight stay instructions given. ?Pre-Op special Istructions -----Hx of left breast cancer, please ask patient if she can have IV/ BP in left arm. ?Patient verbalized understanding of instructions that were given at this phone interview. ?Patient denies shortness of breath, chest pain, fever, cough at this phone interview.  ?

## 2022-05-08 ENCOUNTER — Encounter (HOSPITAL_COMMUNITY)
Admission: RE | Admit: 2022-05-08 | Discharge: 2022-05-08 | Disposition: A | Discharge status: Home / Self Care | Payer: 59 | Source: Ambulatory Visit | Attending: Obstetrics & Gynecology | Admitting: Obstetrics & Gynecology

## 2022-05-08 DIAGNOSIS — Z01818 Encounter for other preprocedural examination: Secondary | ICD-10-CM | POA: Insufficient documentation

## 2022-05-08 LAB — BASIC METABOLIC PANEL
Anion gap: 8 (ref 5–15)
BUN: 12 mg/dL (ref 6–20)
CO2: 26 mmol/L (ref 22–32)
Calcium: 9.7 mg/dL (ref 8.9–10.3)
Chloride: 107 mmol/L (ref 98–111)
Creatinine, Ser: 0.75 mg/dL (ref 0.44–1.00)
GFR, Estimated: >60 mL/min (ref >60)
Glucose, Bld: 102 mg/dL — ABNORMAL HIGH (ref 70–99)
Potassium: 4.4 mmol/L (ref 3.5–5.1)
Sodium: 141 mmol/L (ref 135–145)

## 2022-05-08 LAB — CBC
HCT: 42.7 % (ref 36.0–46.0)
Hemoglobin: 14.2 g/dL (ref 12.0–15.0)
MCH: 32.9 pg (ref 26.0–34.0)
MCHC: 33.3 g/dL (ref 30.0–36.0)
MCV: 98.8 fL (ref 80.0–100.0)
Platelets: 285 10*3/uL (ref 150–400)
RBC: 4.32 MIL/uL (ref 3.87–5.11)
RDW: 11.9 % (ref 11.5–15.5)
WBC: 9.9 10*3/uL (ref 4.0–10.5)
nRBC: 0 % (ref 0.0–0.2)

## 2022-05-10 NOTE — Anesthesia Preprocedure Evaluation (Addendum)
Anesthesia Evaluation  Patient identified by MRN, date of birth, ID band Patient awake    Reviewed: Allergy & Precautions, NPO status , Patient's Chart, lab work & pertinent test results, reviewed documented beta blocker date and time   History of Anesthesia Complications Negative for: history of anesthetic complications  Airway Mallampati: II  TM Distance: >3 FB Neck ROM: Full    Dental  (+) Dental Advisory Given, Teeth Intact   Pulmonary neg pulmonary ROS,    Pulmonary exam normal        Cardiovascular hypertension, Pt. on home beta blockers and Pt. on medications Normal cardiovascular exam     Neuro/Psych  Headaches, PSYCHIATRIC DISORDERS Anxiety  Tinnitus     GI/Hepatic Neg liver ROS, GERD  Medicated,  Endo/Other  negative endocrine ROS  Renal/GU negative Renal ROS     Musculoskeletal  (+) Arthritis ,   Abdominal   Peds  Hematology negative hematology ROS (+)   Anesthesia Other Findings   Reproductive/Obstetrics  Breast cancer                             Anesthesia Physical Anesthesia Plan  ASA: 2  Anesthesia Plan: General   Post-op Pain Management: Tylenol PO (pre-op)* and Toradol IV (intra-op)*   Induction: Intravenous  PONV Risk Score and Plan: 4 or greater and Treatment may vary due to age or medical condition, Dexamethasone, Midazolam, Diphenhydramine, Propofol infusion and Scopolamine patch - Pre-op  Airway Management Planned: Oral ETT  Additional Equipment: None  Intra-op Plan:   Post-operative Plan: Extubation in OR  Informed Consent: I have reviewed the patients History and Physical, chart, labs and discussed the procedure including the risks, benefits and alternatives for the proposed anesthesia with the patient or authorized representative who has indicated his/her understanding and acceptance.     Dental advisory given  Plan Discussed with: CRNA and  Anesthesiologist  Anesthesia Plan Comments:        Anesthesia Quick Evaluation

## 2022-05-11 ENCOUNTER — Ambulatory Visit (HOSPITAL_BASED_OUTPATIENT_CLINIC_OR_DEPARTMENT_OTHER)
Admission: RE | Admit: 2022-05-11 | Discharge: 2022-05-11 | Disposition: A | Discharge status: Home / Self Care | Payer: 59 | Source: Ambulatory Visit | Attending: Obstetrics & Gynecology | Admitting: Obstetrics & Gynecology

## 2022-05-11 ENCOUNTER — Other Ambulatory Visit: Payer: Self-pay

## 2022-05-11 ENCOUNTER — Ambulatory Visit (HOSPITAL_BASED_OUTPATIENT_CLINIC_OR_DEPARTMENT_OTHER): Payer: 59 | Admitting: Anesthesiology

## 2022-05-11 ENCOUNTER — Encounter (HOSPITAL_BASED_OUTPATIENT_CLINIC_OR_DEPARTMENT_OTHER): Payer: Self-pay | Admitting: Obstetrics & Gynecology

## 2022-05-11 ENCOUNTER — Encounter (HOSPITAL_BASED_OUTPATIENT_CLINIC_OR_DEPARTMENT_OTHER)
Admission: RE | Disposition: A | Discharge status: Home / Self Care | Payer: Self-pay | Source: Ambulatory Visit | Attending: Obstetrics & Gynecology

## 2022-05-11 DIAGNOSIS — F419 Anxiety disorder, unspecified: Secondary | ICD-10-CM | POA: Insufficient documentation

## 2022-05-11 DIAGNOSIS — Z853 Personal history of malignant neoplasm of breast: Secondary | ICD-10-CM | POA: Diagnosis not present

## 2022-05-11 DIAGNOSIS — N858 Other specified noninflammatory disorders of uterus: Secondary | ICD-10-CM | POA: Diagnosis not present

## 2022-05-11 DIAGNOSIS — Z79899 Other long term (current) drug therapy: Secondary | ICD-10-CM | POA: Diagnosis not present

## 2022-05-11 DIAGNOSIS — M1990 Unspecified osteoarthritis, unspecified site: Secondary | ICD-10-CM | POA: Diagnosis not present

## 2022-05-11 DIAGNOSIS — Z90710 Acquired absence of both cervix and uterus: Secondary | ICD-10-CM | POA: Diagnosis present

## 2022-05-11 DIAGNOSIS — N950 Postmenopausal bleeding: Secondary | ICD-10-CM | POA: Diagnosis present

## 2022-05-11 DIAGNOSIS — N8003 Adenomyosis of the uterus: Secondary | ICD-10-CM | POA: Insufficient documentation

## 2022-05-11 DIAGNOSIS — I10 Essential (primary) hypertension: Secondary | ICD-10-CM | POA: Diagnosis not present

## 2022-05-11 DIAGNOSIS — D251 Intramural leiomyoma of uterus: Secondary | ICD-10-CM | POA: Diagnosis not present

## 2022-05-11 DIAGNOSIS — K219 Gastro-esophageal reflux disease without esophagitis: Secondary | ICD-10-CM | POA: Diagnosis not present

## 2022-05-11 DIAGNOSIS — Z01818 Encounter for other preprocedural examination: Secondary | ICD-10-CM

## 2022-05-11 HISTORY — DX: Gastro-esophageal reflux disease without esophagitis: K21.9

## 2022-05-11 HISTORY — DX: Unspecified hearing loss, unspecified ear: H91.90

## 2022-05-11 HISTORY — DX: Basal cell carcinoma of skin, unspecified: C44.91

## 2022-05-11 HISTORY — PX: ROBOTIC ASSISTED TOTAL HYSTERECTOMY WITH BILATERAL SALPINGO OOPHERECTOMY: SHX6086

## 2022-05-11 HISTORY — DX: Other intervertebral disc degeneration, lumbar region without mention of lumbar back pain or lower extremity pain: M51.369

## 2022-05-11 HISTORY — DX: Other intervertebral disc degeneration, lumbar region: M51.36

## 2022-05-11 HISTORY — DX: Malignant melanoma of skin, unspecified: C43.9

## 2022-05-11 LAB — ABO/RH: ABO/RH(D): A POS

## 2022-05-11 LAB — TYPE AND SCREEN
ABO/RH(D): A POS
Antibody Screen: NEGATIVE

## 2022-05-11 SURGERY — HYSTERECTOMY, TOTAL, ROBOT-ASSISTED, LAPAROSCOPIC, WITH BILATERAL SALPINGO-OOPHORECTOMY
Anesthesia: General | Site: Abdomen | Laterality: Bilateral

## 2022-05-11 MED ORDER — ROCURONIUM BROMIDE 10 MG/ML (PF) SYRINGE
PREFILLED_SYRINGE | INTRAVENOUS | Status: AC
  Filled 2022-05-11: qty 10

## 2022-05-11 MED ORDER — FENTANYL CITRATE (PF) 100 MCG/2ML IJ SOLN
INTRAMUSCULAR | Status: AC
  Filled 2022-05-11: qty 2

## 2022-05-11 MED ORDER — PROPOFOL 10 MG/ML IV BOLUS
INTRAVENOUS | Status: DC | PRN
Start: 2022-05-11 — End: 2022-05-11
  Administered 2022-05-11: 180 mg via INTRAVENOUS

## 2022-05-11 MED ORDER — OXYCODONE HCL 5 MG PO TABS: 5.0000 mg | ORAL_TABLET | Freq: Once | ORAL | Status: DC | PRN

## 2022-05-11 MED ORDER — PROPOFOL 500 MG/50ML IV EMUL
INTRAVENOUS | Status: DC | PRN
  Administered 2022-05-11: 38.095 ug/kg/min via INTRAVENOUS

## 2022-05-11 MED ORDER — ROCURONIUM BROMIDE 10 MG/ML (PF) SYRINGE
PREFILLED_SYRINGE | INTRAVENOUS | Status: DC | PRN
Start: 2022-05-11 — End: 2022-05-11
  Administered 2022-05-11: 50 mg via INTRAVENOUS
  Administered 2022-05-11 (×2): 20 mg via INTRAVENOUS

## 2022-05-11 MED ORDER — AMISULPRIDE (ANTIEMETIC) 5 MG/2ML IV SOLN: 10.0000 mg | Freq: Once | INTRAVENOUS | Status: DC | PRN

## 2022-05-11 MED ORDER — SUGAMMADEX SODIUM 200 MG/2ML IV SOLN
INTRAVENOUS | Status: DC | PRN
  Administered 2022-05-11: 320 mg via INTRAVENOUS

## 2022-05-11 MED ORDER — ONDANSETRON HCL 4 MG/2ML IJ SOLN
INTRAMUSCULAR | Status: AC
  Filled 2022-05-11: qty 2

## 2022-05-11 MED ORDER — PHENYLEPHRINE HCL-NACL 20-0.9 MG/250ML-% IV SOLN
INTRAVENOUS | Status: DC | PRN
  Administered 2022-05-11: 40 ug/min via INTRAVENOUS

## 2022-05-11 MED ORDER — DEXAMETHASONE SODIUM PHOSPHATE 10 MG/ML IJ SOLN
INTRAMUSCULAR | Status: AC
  Filled 2022-05-11: qty 1

## 2022-05-11 MED ORDER — ACETAMINOPHEN 500 MG PO TABS
1000.0000 mg | ORAL_TABLET | Freq: Once | ORAL | Status: AC
  Administered 2022-05-11 (×2): 1000 mg via ORAL

## 2022-05-11 MED ORDER — PROPOFOL 10 MG/ML IV BOLUS
INTRAVENOUS | Status: AC
  Filled 2022-05-11: qty 20

## 2022-05-11 MED ORDER — IBUPROFEN 600 MG PO TABS: 600.0000 mg | ORAL_TABLET | Freq: Four times a day (QID) | ORAL | 0 refills | Status: AC

## 2022-05-11 MED ORDER — LIDOCAINE 2% (20 MG/ML) 5 ML SYRINGE
INTRAMUSCULAR | Status: DC | PRN
Start: 2022-05-11 — End: 2022-05-11
  Administered 2022-05-11: 100 mg via INTRAVENOUS

## 2022-05-11 MED ORDER — DEXAMETHASONE SODIUM PHOSPHATE 10 MG/ML IJ SOLN
INTRAMUSCULAR | Status: DC | PRN
  Administered 2022-05-11: 10 mg via INTRAVENOUS

## 2022-05-11 MED ORDER — MIDAZOLAM HCL 5 MG/5ML IJ SOLN
INTRAMUSCULAR | Status: DC | PRN
  Administered 2022-05-11: 2 mg via INTRAVENOUS

## 2022-05-11 MED ORDER — EPHEDRINE 5 MG/ML INJ
INTRAVENOUS | Status: AC
  Filled 2022-05-11: qty 5

## 2022-05-11 MED ORDER — LIDOCAINE HCL (PF) 2 % IJ SOLN
INTRAMUSCULAR | Status: AC
  Filled 2022-05-11: qty 10

## 2022-05-11 MED ORDER — ACETAMINOPHEN 500 MG PO TABS
ORAL_TABLET | ORAL | Status: AC
  Filled 2022-05-11: qty 2

## 2022-05-11 MED ORDER — KETOROLAC TROMETHAMINE 30 MG/ML IJ SOLN
INTRAMUSCULAR | Status: AC
  Filled 2022-05-11: qty 1

## 2022-05-11 MED ORDER — STERILE WATER FOR IRRIGATION IR SOLN
Status: DC | PRN
  Administered 2022-05-11: 1000 mL

## 2022-05-11 MED ORDER — KETOROLAC TROMETHAMINE 30 MG/ML IJ SOLN
INTRAMUSCULAR | Status: DC | PRN
  Administered 2022-05-11: 30 mg via INTRAVENOUS

## 2022-05-11 MED ORDER — ACETAMINOPHEN 500 MG PO TABS
1000.0000 mg | ORAL_TABLET | Freq: Four times a day (QID) | ORAL | Status: DC
  Administered 2022-05-11 (×2): 1000 mg via ORAL

## 2022-05-11 MED ORDER — FENTANYL CITRATE (PF) 250 MCG/5ML IJ SOLN
INTRAMUSCULAR | Status: AC
  Filled 2022-05-11: qty 5

## 2022-05-11 MED ORDER — EPHEDRINE SULFATE (PRESSORS) 50 MG/ML IJ SOLN
INTRAMUSCULAR | Status: DC | PRN
Start: 2022-05-11 — End: 2022-05-11
  Administered 2022-05-11: 10 mg via INTRAVENOUS

## 2022-05-11 MED ORDER — MENTHOL 3 MG MT LOZG: 1.0000 | LOZENGE | OROMUCOSAL | Status: DC | PRN

## 2022-05-11 MED ORDER — PROPOFOL 500 MG/50ML IV EMUL
INTRAVENOUS | Status: AC
  Filled 2022-05-11: qty 50

## 2022-05-11 MED ORDER — HEMOSTATIC AGENTS (NO CHARGE) OPTIME
TOPICAL | Status: DC | PRN
  Administered 2022-05-11: 1

## 2022-05-11 MED ORDER — POVIDONE-IODINE 10 % EX SWAB
2.0000 | Freq: Once | CUTANEOUS | Status: DC
Start: 2022-05-11 — End: 2022-05-11

## 2022-05-11 MED ORDER — PHENYLEPHRINE HCL (PRESSORS) 10 MG/ML IV SOLN
INTRAVENOUS | Status: AC
  Filled 2022-05-11: qty 1

## 2022-05-11 MED ORDER — LACTATED RINGERS IV SOLN: INTRAVENOUS | Status: AC

## 2022-05-11 MED ORDER — DEXMEDETOMIDINE (PRECEDEX) IN NS 20 MCG/5ML (4 MCG/ML) IV SYRINGE
PREFILLED_SYRINGE | INTRAVENOUS | Status: AC
  Filled 2022-05-11: qty 5

## 2022-05-11 MED ORDER — DEXMEDETOMIDINE (PRECEDEX) IN NS 20 MCG/5ML (4 MCG/ML) IV SYRINGE
PREFILLED_SYRINGE | INTRAVENOUS | Status: DC | PRN
  Administered 2022-05-11 (×2): 10 ug via INTRAVENOUS

## 2022-05-11 MED ORDER — OXYCODONE HCL 5 MG/5ML PO SOLN: 5.0000 mg | Freq: Once | ORAL | Status: DC | PRN

## 2022-05-11 MED ORDER — DIPHENHYDRAMINE HCL 50 MG/ML IJ SOLN
INTRAMUSCULAR | Status: DC | PRN
  Administered 2022-05-11: 12.5 mg via INTRAVENOUS

## 2022-05-11 MED ORDER — PROPOFOL 1000 MG/100ML IV EMUL
INTRAVENOUS | Status: AC
  Filled 2022-05-11: qty 100

## 2022-05-11 MED ORDER — FENTANYL CITRATE (PF) 100 MCG/2ML IJ SOLN
INTRAMUSCULAR | Status: DC | PRN
Start: 2022-05-11 — End: 2022-05-11
  Administered 2022-05-11: 100 ug via INTRAVENOUS

## 2022-05-11 MED ORDER — PHENYLEPHRINE 80 MCG/ML (10ML) SYRINGE FOR IV PUSH (FOR BLOOD PRESSURE SUPPORT)
PREFILLED_SYRINGE | INTRAVENOUS | Status: AC
  Filled 2022-05-11: qty 10

## 2022-05-11 MED ORDER — CARVEDILOL 6.25 MG PO TABS
6.2500 mg | ORAL_TABLET | Freq: Once | ORAL | Status: AC
  Administered 2022-05-11: 6.25 mg via ORAL
  Filled 2022-05-11: qty 1

## 2022-05-11 MED ORDER — MIDAZOLAM HCL 2 MG/2ML IJ SOLN
INTRAMUSCULAR | Status: AC
  Filled 2022-05-11: qty 2

## 2022-05-11 MED ORDER — OXYCODONE HCL 5 MG PO TABS: 5.0000 mg | ORAL_TABLET | Freq: Four times a day (QID) | ORAL | Status: DC | PRN

## 2022-05-11 MED ORDER — IBUPROFEN 200 MG PO TABS: 600.0000 mg | ORAL_TABLET | Freq: Four times a day (QID) | ORAL | Status: DC

## 2022-05-11 MED ORDER — SODIUM CHLORIDE 0.9 % IR SOLN
Status: DC | PRN
  Administered 2022-05-11: 1000 mL

## 2022-05-11 MED ORDER — LIDOCAINE HCL (PF) 2 % IJ SOLN
INTRAMUSCULAR | Status: DC | PRN
  Administered 2022-05-11: 1.143 mg/kg/h via INTRADERMAL

## 2022-05-11 MED ORDER — SIMETHICONE 80 MG PO CHEW: 80.0000 mg | CHEWABLE_TABLET | Freq: Four times a day (QID) | ORAL | 0 refills | Status: AC | PRN

## 2022-05-11 MED ORDER — LACTATED RINGERS IV SOLN
INTRAVENOUS | Status: DC
  Administered 2022-05-11: 1000 mL via INTRAVENOUS

## 2022-05-11 MED ORDER — CEFAZOLIN SODIUM-DEXTROSE 2-4 GM/100ML-% IV SOLN
2.0000 g | INTRAVENOUS | Status: AC
  Administered 2022-05-11: 2 g via INTRAVENOUS

## 2022-05-11 MED ORDER — CEFAZOLIN SODIUM-DEXTROSE 2-4 GM/100ML-% IV SOLN
INTRAVENOUS | Status: AC
  Filled 2022-05-11: qty 100

## 2022-05-11 MED ORDER — KETOROLAC TROMETHAMINE 30 MG/ML IJ SOLN
30.0000 mg | Freq: Four times a day (QID) | INTRAMUSCULAR | Status: DC
  Administered 2022-05-11: 30 mg via INTRAVENOUS

## 2022-05-11 MED ORDER — DIPHENHYDRAMINE HCL 50 MG/ML IJ SOLN
INTRAMUSCULAR | Status: AC
  Filled 2022-05-11: qty 1

## 2022-05-11 MED ORDER — SIMETHICONE 80 MG PO CHEW: 80.0000 mg | CHEWABLE_TABLET | Freq: Four times a day (QID) | ORAL | Status: DC | PRN

## 2022-05-11 MED ORDER — SODIUM CHLORIDE 0.9 % IV SOLN
INTRAVENOUS | Status: DC | PRN
  Administered 2022-05-11: 20 mL

## 2022-05-11 MED ORDER — ACETAMINOPHEN 500 MG PO TABS
1000.0000 mg | ORAL_TABLET | Freq: Four times a day (QID) | ORAL | 0 refills | Status: AC
Start: 2022-05-11 — End: ?

## 2022-05-11 MED ORDER — FENTANYL CITRATE (PF) 100 MCG/2ML IJ SOLN
25.0000 ug | INTRAMUSCULAR | Status: DC | PRN
  Administered 2022-05-11: 25 ug via INTRAVENOUS

## 2022-05-11 MED ORDER — OXYCODONE HCL 5 MG PO TABS: 5.0000 mg | ORAL_TABLET | Freq: Four times a day (QID) | ORAL | 0 refills | Status: AC | PRN

## 2022-05-11 MED ORDER — PHENYLEPHRINE 80 MCG/ML (10ML) SYRINGE FOR IV PUSH (FOR BLOOD PRESSURE SUPPORT)
PREFILLED_SYRINGE | INTRAVENOUS | Status: DC | PRN
  Administered 2022-05-11 (×2): 160 ug via INTRAVENOUS
  Administered 2022-05-11: 80 ug via INTRAVENOUS
  Administered 2022-05-11: 160 ug via INTRAVENOUS

## 2022-05-11 SURGICAL SUPPLY — 66 items
ADH SKN CLS APL DERMABOND .7 (GAUZE/BANDAGES/DRESSINGS) ×1
APL SRG 38 LTWT LNG FL B (MISCELLANEOUS) ×1
APPLICATOR ARISTA FLEXITIP XL (MISCELLANEOUS) ×1 IMPLANT
BAG DECANTER FOR FLEXI CONT (MISCELLANEOUS) ×1 IMPLANT
CATH FOLEY 3WAY  5CC 16FR (CATHETERS) ×1
CATH FOLEY 3WAY 5CC 16FR (CATHETERS) IMPLANT
COVER BACK TABLE 60X90IN (DRAPES) ×2 IMPLANT
COVER TIP SHEARS 8 DVNC (MISCELLANEOUS) ×1 IMPLANT
COVER TIP SHEARS 8MM DA VINCI (MISCELLANEOUS) ×1
DECANTER SPIKE VIAL GLASS SM (MISCELLANEOUS) ×3 IMPLANT
DEFOGGER SCOPE WARMER CLEARIFY (MISCELLANEOUS) ×2 IMPLANT
DERMABOND ADVANCED (GAUZE/BANDAGES/DRESSINGS) ×1
DERMABOND ADVANCED .7 DNX12 (GAUZE/BANDAGES/DRESSINGS) ×1 IMPLANT
DRAPE ARM DVNC X/XI (DISPOSABLE) ×4 IMPLANT
DRAPE COLUMN DVNC XI (DISPOSABLE) ×1 IMPLANT
DRAPE DA VINCI XI ARM (DISPOSABLE) ×4
DRAPE DA VINCI XI COLUMN (DISPOSABLE) ×1
DRAPE UTILITY XL STRL (DRAPES) ×2 IMPLANT
DURAPREP 26ML APPLICATOR (WOUND CARE) ×2 IMPLANT
ELECT REM PT RETURN 9FT ADLT (ELECTROSURGICAL) ×2
ELECTRODE REM PT RTRN 9FT ADLT (ELECTROSURGICAL) ×1 IMPLANT
GAUZE 4X4 16PLY ~~LOC~~+RFID DBL (SPONGE) ×6 IMPLANT
GLOVE BIO SURGEON STRL SZ 6.5 (GLOVE) ×3 IMPLANT
GLOVE BIO SURGEON STRL SZ7 (GLOVE) ×7 IMPLANT
GLOVE BIOGEL PI IND STRL 6.5 (GLOVE) IMPLANT
GLOVE BIOGEL PI IND STRL 7.0 (GLOVE) ×5 IMPLANT
GLOVE BIOGEL PI INDICATOR 6.5 (GLOVE) ×2
GLOVE BIOGEL PI INDICATOR 7.0 (GLOVE) ×8
HEMOSTAT ARISTA ABSORB 3G PWDR (HEMOSTASIS) ×1 IMPLANT
HIBICLENS CHG 4% 4OZ (MISCELLANEOUS) ×2 IMPLANT
IRRIG SUCT STRYKERFLOW 2 WTIP (MISCELLANEOUS) ×2
IRRIGATION SUCT STRKRFLW 2 WTP (MISCELLANEOUS) ×1 IMPLANT
IV NS 1000ML (IV SOLUTION) ×2
IV NS 1000ML BAXH (IV SOLUTION) IMPLANT
KIT TURNOVER CYSTO (KITS) ×2 IMPLANT
LEGGING LITHOTOMY PAIR STRL (DRAPES) ×2 IMPLANT
MANIFOLD NEPTUNE II (INSTRUMENTS) ×1 IMPLANT
OBTURATOR OPTICAL STANDARD 8MM (TROCAR) ×1
OBTURATOR OPTICAL STND 8 DVNC (TROCAR) ×1
OBTURATOR OPTICALSTD 8 DVNC (TROCAR) ×1 IMPLANT
OCCLUDER COLPOPNEUMO (BALLOONS) ×2 IMPLANT
PACK ROBOT WH (CUSTOM PROCEDURE TRAY) ×2 IMPLANT
PACK ROBOTIC GOWN (GOWN DISPOSABLE) ×2 IMPLANT
PACK TRENDGUARD 450 HYBRID PRO (MISCELLANEOUS) ×1 IMPLANT
PAD OB MATERNITY 4.3X12.25 (PERSONAL CARE ITEMS) ×2 IMPLANT
PAD PREP 24X48 CUFFED NSTRL (MISCELLANEOUS) ×2 IMPLANT
PROTECTOR NERVE ULNAR (MISCELLANEOUS) ×2 IMPLANT
SEAL CANN UNIV 5-8 DVNC XI (MISCELLANEOUS) ×3 IMPLANT
SEAL XI 5MM-8MM UNIVERSAL (MISCELLANEOUS) ×3
SEALER VESSEL DA VINCI XI (MISCELLANEOUS) ×1
SEALER VESSEL EXT DVNC XI (MISCELLANEOUS) ×1 IMPLANT
SET IRRIG Y TYPE TUR BLADDER L (SET/KITS/TRAYS/PACK) ×1 IMPLANT
SET TRI-LUMEN FLTR TB AIRSEAL (TUBING) ×2 IMPLANT
SPONGE T-LAP 4X18 ~~LOC~~+RFID (SPONGE) ×2 IMPLANT
SUT VIC AB 4-0 PS2 18 (SUTURE) ×4 IMPLANT
SUT VICRYL 0 UR6 27IN ABS (SUTURE) ×2 IMPLANT
SUT VLOC 180 0 9IN  GS21 (SUTURE) ×1
SUT VLOC 180 0 9IN GS21 (SUTURE) ×1 IMPLANT
TIP UTERINE 6.7X8CM BLUE DISP (MISCELLANEOUS) ×1 IMPLANT
TOWEL OR 17X26 10 PK STRL BLUE (TOWEL DISPOSABLE) ×2 IMPLANT
TRAP SPECIMEN MUCUS 40CC (MISCELLANEOUS) ×1 IMPLANT
TRAY FOL W/BAG SLVR 16FR STRL (SET/KITS/TRAYS/PACK) IMPLANT
TRAY FOLEY W/BAG SLVR 16FR LF (SET/KITS/TRAYS/PACK) ×2
TRENDGUARD 450 HYBRID PRO PACK (MISCELLANEOUS) ×2
TROCAR PORT AIRSEAL 5X120 (TROCAR) ×2 IMPLANT
WATER STERILE IRR 1000ML POUR (IV SOLUTION) ×1 IMPLANT

## 2022-05-11 NOTE — H&P (Addendum)
Cynthia [name] is an 56 y.o. female with know breast cancer- 2016 (ER, PR, HER2 pos)  Here for robot assisted total laparoscopic hysterectomy and bilateral salpingo-oophorectomy for recurrent vaginal bleeding even after stopping Tamoxifen for 1 year. Prior several endometrial biopsies were negative. Prior hysteroscopy, polypectomy-benign  Office sono noted bulky uterus, possible adenomyosis and normal ovaries.  Nl Pap hx. No new breast problems.  Recent worsening constipation with alternating diarrhea, has h/o diverticulitis, denies fever/ chills/ abdo pain/ nausea.  Patient's last menstrual period was 11/20/2015.    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 Patient states that she has plates in her ribs.   Skin cancer    Hx of melanoma at 53y; multiple basal cell carcinomas first dx in late 54s    Past Surgical History:  Procedure Laterality Date   BLADDER SUSPENSION  12/26/2007   DILATION AND CURETTAGE OF UTERUS  10/2021   @ [facility]   miscarriage   ORIF CLAVICLE FRACTURE  2017   right   PORT-A-CATH REMOVAL  2018   PORTACATH PLACEMENT Right 04/27/2015   Procedure: INSERTION PORT-A-CATH;  Surgeon: Rolm Bookbinder, MD;  Location: [facility];  Service: General;  Laterality: Right;   RADIOACTIVE SEED GUIDED PARTIAL MASTECTOMY WITH AXILLARY SENTINEL LYMPH NODE BIOPSY Left 09/21/2015   Procedure: RADIOACTIVE SEED GUIDED LUMPECTOMY WITH AXILLARY SENTINEL LYMPH NODE BIOPSY;  Surgeon: Rolm Bookbinder, MD;  Location: [facility];  Service: General;  Laterality: Left;    Family History  Problem Relation Age of Onset   Breast cancer Maternal Aunt        dx. 11s   Lung cancer Maternal Grandmother        dx. late 50s-early 60s; "metastasis to breast"; smoker   Lung cancer Paternal Grandfather        dx. 46s; smokeless tobacco user   Other Mother        hx of TAH   Colon polyps Father        unspecified amt   Melanoma Maternal Uncle 96   Heart Problems Paternal Grandmother    Diabetes Paternal Grandmother    Melanoma Other 33    Social History:  reports that she has never smoked. She has never used smokeless tobacco. She reports current alcohol use of about 1.0 standard drink per week. She reports that she does not use drugs.  Allergies:  Allergies  Allergen Reactions   Zofran [Ondansetron Hcl] Other (See Comments)    headache   Adhesive [Tape] Rash  Steri strip    Medications Prior to Admission  Medication Sig Dispense Refill Last Dose   Biotin 1000 MCG tablet Take 1,000 mcg by mouth 3 (three) times daily.   05/10/2022   carvedilol (COREG) 6.25 MG tablet Take 6.25 mg by mouth 2 (two) times daily with a meal.   05/10/2022 at 2130   diazepam (VALIUM) 10 MG tablet Take 10 mg by mouth at bedtime as needed for anxiety.   Past Week   famotidine (PEPCID) 10 MG tablet Take 10 mg by mouth at bedtime. Pt unsure if med is Pepcid or another acid reducer.   05/10/2022   loratadine (CLARITIN) 10 MG tablet Take 1 tablet (10 mg total) by mouth daily. 90 tablet 2 05/10/2022   Multiple Vitamins-Minerals (CENTRUM SILVER PO) Take by mouth. Reported on 01/20/2016   05/10/2022    venlafaxine (EFFEXOR) 37.5 MG tablet USE AS DIRECTED PER MD TAPERING SCHEDULE FOR WEANING OFF MEDICATION BY 25% EVERY 2 WEEKS (Patient taking differently: Take 37.5 mg by mouth daily. Use as directed per MD tapering schedule for weaning off medication by 25% every 2 weeks) 90 tablet 1 05/10/2022   valACYclovir (VALTREX) 500 MG tablet TAKE 1 TABLET BY MOUTH TWICE A DAY (Patient taking differently: 2 (two) times daily as needed.) 60 tablet 0 Unknown    Review of Systems  Blood pressure 121/75, pulse 89, temperature 98.8 F (37.1 C), temperature source Oral, resp. rate 16, height '5\' 8"'  (1.727 m), weight 84 kg, last menstrual period 11/20/2015, SpO2 99 %. Physical Exam Physical exam:  A&O x 3, no acute distress. Pleasant HEENT neg, no thyromegaly Lungs CTA bilat CV RRR, S1S2 normal Abdo soft, non tender, non acute Extr no edema/ tenderness Pelvic Uterus bulky, normal adnexa, normal cervix    Results for orders placed or performed during the hospital encounter of 05/11/22 (from the past 24 hour(s))  ABO/Rh     Status: None   Collection Time: 05/11/22  6:20 AM  Result Value Ref Range   ABO/RH(D)      A POS Performed at [facility] [address], [place], [place] [address]     Assessment/Plan: 56 yo with recurrent vaginal bleeding here for robot assisted total laparoscopic hysterectomy and bilateral salpingo-oophorectomy and pelvic washings. Ovaries being removed since in menopause and h/o ER pos breast cancer.  Risks/complications of surgery reviewed incl infection, bleeding, damage to internal organs including bladder, bowels, ureters, blood vessels, other risks from anesthesia, VTE and delayed complications of any surgery, complications in future surgery reviewed. Also discussed neonatal complications incl difficult delivery, laceration, vacuum assistance, TTN etc. Pt understands and agrees, all concerns addressed.     Cynthia [name] 05/11/2022, 7:24 AM

## 2022-05-11 NOTE — Anesthesia Procedure Notes (Signed)
Procedure Name: Intubation Date/Time: 05/11/2022 7:46 AM Performed by: Bonney Aid, CRNA Pre-anesthesia Checklist: Patient identified, Emergency Drugs available, Suction available and Patient being monitored Patient Re-evaluated:Patient Re-evaluated prior to induction Oxygen Delivery Method: Circle system utilized Preoxygenation: Pre-oxygenation with 100% oxygen Induction Type: IV induction Ventilation: Mask ventilation without difficulty Laryngoscope Size: Mac and 3 Grade View: Grade I Tube type: Oral Tube size: 7.5 mm Number of attempts: 1 Airway Equipment and Method: Stylet Placement Confirmation: ETT inserted through vocal cords under direct vision, positive ETCO2 and breath sounds checked- equal and bilateral Secured at: 22 cm Tube secured with: Tape Dental Injury: Teeth and Oropharynx as per pre-operative assessment

## 2022-05-11 NOTE — Transfer of Care (Signed)
Immediate Anesthesia Transfer of Care Note  Patient: Cynthia [name]  Procedure(s) Performed: XI ROBOTIC ASSISTED TOTAL HYSTERECTOMY WITH BILATERAL SALPINGO OOPHORECTOMY, PELVIC WASHINGS (Bilateral: Abdomen)  Patient Location: PACU  Anesthesia Type:General  Level of Consciousness: drowsy  Airway & Oxygen Therapy: Patient Spontanous Breathing and Patient connected to nasal cannula oxygen  Post-op Assessment: Report given to RN  Post vital signs: Reviewed and stable  Last Vitals:  Vitals Value Taken Time  BP 110/67 05/11/22 0941  Temp    Pulse 71 05/11/22 0944  Resp 17 05/11/22 0944  SpO2 100 % 05/11/22 0944  Vitals shown include unvalidated device data.  Last Pain:  Vitals:   05/11/22 0557  TempSrc: Oral  PainSc: 1       Patients Stated Pain Goal: 6 (58/48/35 0757)  Complications: No notable events documented.

## 2022-05-11 NOTE — Discharge Summary (Signed)
Physician Discharge Summary  Patient ID: Cynthia [name] MRN: [id number] DOB/AGE: 08-01-1966 56 y.o.  Admit date: 05/11/2022 Discharge date: 05/11/2022  Admission Diagnoses:recurrent postmenopausal bleeding   Discharge Diagnoses: Same. S/p da vinci assisted total laparoscopic hysterectomy and bilateral salpingo-oophorectomy and pelvic washings   Discharged Condition: good  Hospital Course: Uncomplicated surgery and post op recovery. C/o bladder being full and having the urge to void. She voided several times 100-150 cc per RN and post-void residual volume 0 checked twice. (I checked one with her) Pt reassured. Eating regular diet, no emesis. Pain is minimal, not taking oxycodone. Ambulating. No vag bleeding.   Discharge Exam: Blood pressure 127/77, pulse 93, temperature 98.2 F (36.8 C), temperature source Oral, resp. rate 14, height '5\' 8"'$  (1.727 m), weight 84 kg, last menstrual period 11/20/2015, SpO2 94 %. General appearance: alert and cooperative Resp: clear to auscultation bilaterally Cardio: regular rate and rhythm, S1, S2 normal, no murmur, click, rub or gallop GI: soft, non-tender; bowel sounds normal; no masses,  no organomegaly Pelvic: no bleeding per vagina Extremities: extremities normal, atraumatic, no cyanosis or edema and Homans sign is negative, no sign of DVT  Disposition: Discharge disposition: 01-Home or Self Care     Post op care/ restrictions. Warning signs d/w pt   Discharge Instructions     Call MD for:   Complete by: As directed    Heavy vaginal bleeding   Call MD for:  difficulty breathing, headache or visual disturbances   Complete by: As directed    Call MD for:  hives   Complete by: As directed    Call MD for:  persistant dizziness or light-headedness   Complete by: As directed    Call MD for:  persistant nausea and vomiting   Complete by: As directed    Call MD for:  redness, tenderness, or signs of infection (pain, swelling, redness, odor or  green/yellow discharge around incision site)   Complete by: As directed    Call MD for:  severe uncontrolled pain   Complete by: As directed    Call MD for:  temperature >100.4   Complete by: As directed    Diet - low sodium heart healthy   Complete by: As directed    Discharge wound care:   Complete by: As directed    Keep incisions clean and dry   Increase activity slowly   Complete by: As directed       Allergies as of 05/11/2022       Reactions   Zofran [ondansetron Hcl] Other (See Comments)   headache   Adhesive [tape] Rash   Steri strip        Medication List     TAKE these medications    acetaminophen 500 MG tablet Commonly known as: TYLENOL Take 2 tablets (1,000 mg total) by mouth every 6 (six) hours.   Biotin 1000 MCG tablet Take 1,000 mcg by mouth 3 (three) times daily.   carvedilol 6.25 MG tablet Commonly known as: COREG Take 6.25 mg by mouth 2 (two) times daily with a meal.   CENTRUM SILVER PO Take by mouth. Reported on 01/20/2016   diazepam 10 MG tablet Commonly known as: VALIUM Take 10 mg by mouth at bedtime as needed for anxiety.   famotidine 10 MG tablet Commonly known as: PEPCID Take 10 mg by mouth at bedtime. Pt unsure if med is Pepcid or another acid reducer.   ibuprofen 600 MG tablet Commonly known as: ADVIL Take 1 tablet (600 mg  total) by mouth every 6 (six) hours. Start taking on: May 12, 2022   loratadine 10 MG tablet Commonly known as: CLARITIN Take 1 tablet (10 mg total) by mouth daily.   oxyCODONE 5 MG immediate release tablet Commonly known as: Oxy IR/ROXICODONE Take 1 tablet (5 mg total) by mouth every 6 (six) hours as needed for moderate pain.   simethicone 80 MG chewable tablet Commonly known as: MYLICON Chew 1 tablet (80 mg total) by mouth 4 (four) times daily as needed for flatulence.   valACYclovir 500 MG tablet Commonly known as: VALTREX TAKE 1 TABLET BY MOUTH TWICE A DAY What changed:  how much to take how to  take this when to take this reasons to take this   venlafaxine 37.5 MG tablet Commonly known as: EFFEXOR USE AS DIRECTED PER MD TAPERING SCHEDULE FOR WEANING OFF MEDICATION BY 25% EVERY 2 WEEKS What changed: See the new instructions.               Discharge Care Instructions  (From admission, onward)           Start     Ordered   05/11/22 0000  Discharge wound care:       Comments: Keep incisions clean and dry   05/11/22 1756            Follow-up Information     Azucena Fallen, MD Follow up in 3 week(s).   Specialty: Obstetrics and Gynecology Contact information: [address] [place] [address] [phone]                 Signed: Elveria Royals 05/11/2022, 5:57 PM

## 2022-05-11 NOTE — Op Note (Signed)
Cynthia [name]  05/11/2022  Preoperative diagnosis:  Recurrent post-menopausal uterine bleeding, h/o breast cancer  Postop diagnosis: Same Procedure: da Vinci robot assisted total laparoscopic hysterectomy and bilateral salpingo-oophorectomy and pelvic washings  Anesthesia Gen. Endotracheal Surgeon: Dr. Azucena Fallen Assistant: Derrell Lolling, CNM  IV fluids: 1300 cc LR EBL: 25 cc Urine output: 25 cc, cloudy Complications: none Pathology: Uterus with cervix and both ovaries and fallopian tubes, pelvic washings Disposition: PACU, stable Findings: Normal uterus and ovaries and fallopian tubes. Normal appearing liver surface. Normal gross appearance of bowel and omentum. Increased pelvic vascularity   Procedure:  Indication: --Recurrent post-menopausal uterine bleeding. Normal endometrial biopsies. H/o breast cancer 2016. Stopped Tamoxifen in 2021 but recurrent off and on bleeding continued and was heavy. Failed progestin therapy. Long term progestins not desired due to ER/PR + breast cancer. So decision was made for hysterectomy.  Complications of surgery including infection, bleeding, damage to internal organs and other surgery related problems including pneumonia, VTE reviewed and informed written consent was obtained. She understood and gave informed written consent.   Patient was brought to the operating room with IV running. She received 2 gm Ancef. She underwent general anesthesia without difficulty and was given dorsal lithotomy position, prepped and draped in sterile fashion. Foley catheter was placed. Cervix was exposed with a speculum and anterior lip of the cervix was grasped with tenaculum. Uterus was sounded to 9 cm. A  # 8 Rumi tip and a large Koh ring was assembled on the [employer] and entered in the uterine cavity and balloon was inflated to secure it in place. Koh ring was palpated again cervico-vaginal junction. Speculum was removed, tenaculum was left on the cervix.    Attention was focused on abdomen after changing gown and gloves. A 10 mm transverse incision made at the upper edge of umbilicus with scalpel after injecting Marcaine, fascia dissected, grasped with Kocher's and incised, posterior rectus sheath and peritoneum grasped, incised, intraabdominal entry confirmed. Purse string stay stitch on 0-Vicryl taken on fascia and Robot cannula witth occluder introduced and Vicryl sutures secured. Pneumoperitoneum was begun. Laparoscope was introduced and the peritoneal cavity was evaluated. There was no evidence of adhesions. Trendelenburg position given. Port sited marked and injected with Marcaine and incised. One robotic cannulas inserted on left and one Airseal on the left and one robot canula on the right under vision. Robot was docked from the right and laparoscope inserted in arm 2. Arm 1 had vessel sealer and arm 3 had endo scissors with monopolar energy. 4th robot arm was not used.  Dr. Benjie Karvonen scrubbed out and went for surgical console.  Uterus, ovaries, tubes and ureters evaluated, appeared normal.  Pelvic washings performed and obtained. Uterus was deviated to the patient's right. The left IP ligament was desiccated and cut, followed by broad ligament and then the left Round ligament which was desiccated and cut. Anterior bladder broad ligament was opened and incised. Posterior broad ligament incised up to the uterosacral ligament and left uterine vessels skeletonized. Uterus was deviated to the left and right IP was exposed, desiccated and incised followed by broad ligament and right Round ligament. Anterior broad ligament was incised to create bladder flap, bladder was pushed away by blunt and sharp dissection with excellent hemostasis. Koh ring impression at cervicovaginal junction was seen well anteriorly. Right posterior broad ligament dissected, right Uterine vessels skeletonized. Right uterine vessels were desiccated and cut. Uterus was deviated to the right  and the left uterine vessels were desiccated and  cut. Vaginal occluder was inflated. Colpotomy was begun starting from midline anteriorly coming to the left and right and then circumferentially staying above the uterosacral ligaments posteriorly. Uterus, cervix, both tubes and ovaries were pulled out of the vaginal opening and vaginal occluder was placed back.  Vaginal cut edges were evaluated for hemostasis which was excellent. Irrigation was performed pedicles appeared dry. Scissors were switched for Mega-needle driver. 0-V lock used for suturing vaginal cuff. Hemostasis noted except some oozing from posterior broad ligament and uterosacral area. No active bleeding noted. Irrigation performed and then Arista sprayed. Instruments were removed. Robot was un-docked. Patient was made supine. Lap'scope was reintroduced, hemostasis was excellent. Liver surface appeared normal.  Robotic cannulas were removed under vision. Laparoscope and central port removed under vision. Airseal port was removed last. The stay sutures at the umbilical fascia tied together with excellent fascial closure. Skin approximated with subcuticular stitches on 4-0 Vicryl. Dermabond was applied.  Vaginal occluder and foley were removed. No vaginal bleeding noted. All counts were correct x2.  No complications. Patient tolerated procedure well and was reversed from anesthesia and brought to the PACU stable condition. Plan is for discharge later today if meets criteria.  Patient's daughter informed.   I performed this entire surgery.  --Azucena Fallen MD  [place]

## 2022-05-12 LAB — CYTOLOGY - NON PAP

## 2022-05-12 LAB — SURGICAL PATHOLOGY

## 2022-05-12 NOTE — Anesthesia Postprocedure Evaluation (Signed)
Anesthesia Post Note  Patient: Vendetta Pittinger  Procedure(s) Performed: XI ROBOTIC ASSISTED TOTAL HYSTERECTOMY WITH BILATERAL SALPINGO OOPHORECTOMY, PELVIC WASHINGS (Bilateral: Abdomen)     Patient location during evaluation: PACU Anesthesia Type: General Level of consciousness: awake and alert Pain management: pain level controlled Vital Signs Assessment: post-procedure vital signs reviewed and stable Respiratory status: spontaneous breathing, nonlabored ventilation and respiratory function stable Cardiovascular status: stable and blood pressure returned to baseline Anesthetic complications: no   No notable events documented.  Last Vitals:  Vitals:   05/11/22 1530 05/11/22 1755  BP: 133/78 127/77  Pulse: 94 93  Resp: 16 14  Temp: (!) 36.3 C 36.8 C  SpO2: 96% 94%    Last Pain:  Vitals:   05/11/22 1755  TempSrc: Oral  PainSc:                  Audry Pili

## 2022-05-15 ENCOUNTER — Encounter (HOSPITAL_BASED_OUTPATIENT_CLINIC_OR_DEPARTMENT_OTHER): Payer: Self-pay | Admitting: Obstetrics & Gynecology
